# Patient Record
Sex: Female | Born: 1960 | Race: White | Hispanic: No | Marital: Married | Smoking: Never smoker
Health system: Southern US, Community
[De-identification: ages and names within clinical notes are randomized; demographics above are authoritative.]

## PROBLEM LIST (undated history)

## (undated) DIAGNOSIS — I10 Essential (primary) hypertension: Secondary | ICD-10-CM

## (undated) DIAGNOSIS — J45909 Unspecified asthma, uncomplicated: Secondary | ICD-10-CM

## (undated) DIAGNOSIS — E7800 Pure hypercholesterolemia, unspecified: Secondary | ICD-10-CM

## (undated) DIAGNOSIS — I639 Cerebral infarction, unspecified: Secondary | ICD-10-CM

## (undated) HISTORY — PX: ABDOMINAL HYSTERECTOMY: SHX81

## (undated) HISTORY — PX: FOOT SURGERY: SHX648

## (undated) HISTORY — PX: REPLACEMENT TOTAL KNEE: SUR1224

## (undated) HISTORY — PX: COMBINED KIDNEY-PANCREAS TRANSPLANT: SHX1382

---

## 2008-09-26 DIAGNOSIS — I63239 Cerebral infarction due to unspecified occlusion or stenosis of unspecified carotid arteries: Secondary | ICD-10-CM | POA: Insufficient documentation

## 2008-09-26 DIAGNOSIS — D649 Anemia, unspecified: Secondary | ICD-10-CM | POA: Insufficient documentation

## 2008-09-26 DIAGNOSIS — E113299 Type 2 diabetes mellitus with mild nonproliferative diabetic retinopathy without macular edema, unspecified eye: Secondary | ICD-10-CM | POA: Insufficient documentation

## 2008-12-05 DIAGNOSIS — F329 Major depressive disorder, single episode, unspecified: Secondary | ICD-10-CM | POA: Insufficient documentation

## 2009-09-25 DIAGNOSIS — E785 Hyperlipidemia, unspecified: Secondary | ICD-10-CM | POA: Insufficient documentation

## 2009-09-25 DIAGNOSIS — K219 Gastro-esophageal reflux disease without esophagitis: Secondary | ICD-10-CM | POA: Insufficient documentation

## 2010-07-21 DIAGNOSIS — I639 Cerebral infarction, unspecified: Secondary | ICD-10-CM | POA: Insufficient documentation

## 2010-09-22 DIAGNOSIS — M810 Age-related osteoporosis without current pathological fracture: Secondary | ICD-10-CM | POA: Insufficient documentation

## 2011-01-21 DIAGNOSIS — B009 Herpesviral infection, unspecified: Secondary | ICD-10-CM | POA: Insufficient documentation

## 2011-01-21 DIAGNOSIS — Z9483 Pancreas transplant status: Secondary | ICD-10-CM | POA: Insufficient documentation

## 2011-05-23 DIAGNOSIS — E11359 Type 2 diabetes mellitus with proliferative diabetic retinopathy without macular edema: Secondary | ICD-10-CM | POA: Insufficient documentation

## 2011-05-23 DIAGNOSIS — E113599 Type 2 diabetes mellitus with proliferative diabetic retinopathy without macular edema, unspecified eye: Secondary | ICD-10-CM | POA: Insufficient documentation

## 2011-05-23 DIAGNOSIS — H2510 Age-related nuclear cataract, unspecified eye: Secondary | ICD-10-CM | POA: Insufficient documentation

## 2011-05-23 DIAGNOSIS — H35379 Puckering of macula, unspecified eye: Secondary | ICD-10-CM | POA: Insufficient documentation

## 2011-05-30 DIAGNOSIS — E1039 Type 1 diabetes mellitus with other diabetic ophthalmic complication: Secondary | ICD-10-CM | POA: Insufficient documentation

## 2011-10-15 DIAGNOSIS — E0590 Thyrotoxicosis, unspecified without thyrotoxic crisis or storm: Secondary | ICD-10-CM | POA: Insufficient documentation

## 2011-12-02 DIAGNOSIS — H04123 Dry eye syndrome of bilateral lacrimal glands: Secondary | ICD-10-CM | POA: Insufficient documentation

## 2011-12-30 DIAGNOSIS — H40032 Anatomical narrow angle, left eye: Secondary | ICD-10-CM | POA: Insufficient documentation

## 2011-12-30 DIAGNOSIS — H35341 Macular cyst, hole, or pseudohole, right eye: Secondary | ICD-10-CM | POA: Insufficient documentation

## 2012-01-04 DIAGNOSIS — Z7901 Long term (current) use of anticoagulants: Secondary | ICD-10-CM | POA: Insufficient documentation

## 2012-02-08 DIAGNOSIS — Z872 Personal history of diseases of the skin and subcutaneous tissue: Secondary | ICD-10-CM | POA: Insufficient documentation

## 2012-02-08 DIAGNOSIS — Z85828 Personal history of other malignant neoplasm of skin: Secondary | ICD-10-CM | POA: Insufficient documentation

## 2012-04-19 DIAGNOSIS — G9340 Encephalopathy, unspecified: Secondary | ICD-10-CM | POA: Insufficient documentation

## 2013-09-02 ENCOUNTER — Ambulatory Visit
Admission: RE | Admit: 2013-09-02 | Discharge: 2013-09-02 | Disposition: A | Discharge status: Home / Self Care | Payer: 59 | Source: Ambulatory Visit | Attending: Family Medicine | Admitting: Family Medicine

## 2013-09-02 ENCOUNTER — Other Ambulatory Visit: Payer: Self-pay | Admitting: Family Medicine

## 2013-09-02 DIAGNOSIS — R0602 Shortness of breath: Secondary | ICD-10-CM

## 2013-09-02 DIAGNOSIS — R059 Cough, unspecified: Secondary | ICD-10-CM

## 2013-09-02 DIAGNOSIS — R05 Cough: Secondary | ICD-10-CM

## 2013-11-01 ENCOUNTER — Other Ambulatory Visit: Payer: Self-pay | Admitting: Family Medicine

## 2013-11-01 DIAGNOSIS — R05 Cough: Secondary | ICD-10-CM

## 2013-11-01 DIAGNOSIS — R059 Cough, unspecified: Secondary | ICD-10-CM

## 2013-11-01 DIAGNOSIS — R9389 Abnormal findings on diagnostic imaging of other specified body structures: Secondary | ICD-10-CM

## 2013-11-06 ENCOUNTER — Other Ambulatory Visit: Payer: Self-pay

## 2013-11-25 ENCOUNTER — Ambulatory Visit
Admission: RE | Admit: 2013-11-25 | Discharge: 2013-11-25 | Disposition: A | Discharge status: Home / Self Care | Payer: 59 | Source: Ambulatory Visit | Attending: Family Medicine | Admitting: Family Medicine

## 2013-11-25 DIAGNOSIS — R059 Cough, unspecified: Secondary | ICD-10-CM

## 2013-11-25 DIAGNOSIS — R05 Cough: Secondary | ICD-10-CM

## 2013-11-25 DIAGNOSIS — R9389 Abnormal findings on diagnostic imaging of other specified body structures: Secondary | ICD-10-CM

## 2013-11-25 MED ORDER — IOHEXOL 300 MG/ML  SOLN
50.0000 mL | Freq: Once | INTRAMUSCULAR | Status: AC | PRN
  Administered 2013-11-25: 50 mL via INTRAVENOUS

## 2014-03-13 ENCOUNTER — Other Ambulatory Visit: Payer: Self-pay | Admitting: Family Medicine

## 2014-03-13 DIAGNOSIS — Z1231 Encounter for screening mammogram for malignant neoplasm of breast: Secondary | ICD-10-CM

## 2014-03-25 ENCOUNTER — Inpatient Hospital Stay: Admission: RE | Admit: 2014-03-25 | Payer: Self-pay | Source: Ambulatory Visit

## 2014-11-07 ENCOUNTER — Other Ambulatory Visit: Payer: Self-pay

## 2014-11-07 DIAGNOSIS — Z1231 Encounter for screening mammogram for malignant neoplasm of breast: Secondary | ICD-10-CM

## 2014-11-14 ENCOUNTER — Ambulatory Visit
Admission: RE | Admit: 2014-11-14 | Discharge: 2014-11-14 | Disposition: A | Discharge status: Home / Self Care | Payer: BLUE CROSS/BLUE SHIELD | Source: Ambulatory Visit

## 2014-11-14 ENCOUNTER — Ambulatory Visit: Payer: Self-pay

## 2014-11-14 DIAGNOSIS — Z1231 Encounter for screening mammogram for malignant neoplasm of breast: Secondary | ICD-10-CM

## 2015-01-17 ENCOUNTER — Emergency Department (HOSPITAL_COMMUNITY)
Admission: EM | Admit: 2015-01-17 | Discharge: 2015-01-17 | Disposition: A | Discharge status: Home / Self Care | Payer: BLUE CROSS/BLUE SHIELD | Attending: Emergency Medicine | Admitting: Emergency Medicine

## 2015-01-17 ENCOUNTER — Encounter (HOSPITAL_COMMUNITY): Payer: Self-pay | Admitting: *Deleted

## 2015-01-17 ENCOUNTER — Emergency Department (HOSPITAL_COMMUNITY): Payer: BLUE CROSS/BLUE SHIELD

## 2015-01-17 DIAGNOSIS — Z8639 Personal history of other endocrine, nutritional and metabolic disease: Secondary | ICD-10-CM | POA: Insufficient documentation

## 2015-01-17 DIAGNOSIS — I10 Essential (primary) hypertension: Secondary | ICD-10-CM | POA: Diagnosis not present

## 2015-01-17 DIAGNOSIS — S4991XA Unspecified injury of right shoulder and upper arm, initial encounter: Secondary | ICD-10-CM | POA: Diagnosis present

## 2015-01-17 DIAGNOSIS — W010XXA Fall on same level from slipping, tripping and stumbling without subsequent striking against object, initial encounter: Secondary | ICD-10-CM | POA: Insufficient documentation

## 2015-01-17 DIAGNOSIS — Y9389 Activity, other specified: Secondary | ICD-10-CM | POA: Insufficient documentation

## 2015-01-17 DIAGNOSIS — Y998 Other external cause status: Secondary | ICD-10-CM | POA: Insufficient documentation

## 2015-01-17 DIAGNOSIS — S42211A Unspecified displaced fracture of surgical neck of right humerus, initial encounter for closed fracture: Secondary | ICD-10-CM | POA: Diagnosis not present

## 2015-01-17 DIAGNOSIS — J45909 Unspecified asthma, uncomplicated: Secondary | ICD-10-CM | POA: Insufficient documentation

## 2015-01-17 DIAGNOSIS — Z8673 Personal history of transient ischemic attack (TIA), and cerebral infarction without residual deficits: Secondary | ICD-10-CM | POA: Diagnosis not present

## 2015-01-17 DIAGNOSIS — M25511 Pain in right shoulder: Secondary | ICD-10-CM

## 2015-01-17 DIAGNOSIS — Y9289 Other specified places as the place of occurrence of the external cause: Secondary | ICD-10-CM | POA: Insufficient documentation

## 2015-01-17 HISTORY — DX: Cerebral infarction, unspecified: I63.9

## 2015-01-17 HISTORY — DX: Pure hypercholesterolemia, unspecified: E78.00

## 2015-01-17 HISTORY — DX: Essential (primary) hypertension: I10

## 2015-01-17 HISTORY — DX: Unspecified asthma, uncomplicated: J45.909

## 2015-01-17 MED ORDER — HYDROCODONE-ACETAMINOPHEN 5-325 MG PO TABS
1.0000 | ORAL_TABLET | Freq: Once | ORAL | Status: AC
  Administered 2015-01-17: 1 via ORAL
  Filled 2015-01-17: qty 1

## 2015-01-17 MED ORDER — HYDROCODONE-ACETAMINOPHEN 5-325 MG PO TABS: 1.0000 | ORAL_TABLET | Freq: Four times a day (QID) | ORAL | Status: DC | PRN

## 2015-01-17 MED ORDER — NAPROXEN 500 MG PO TABS: 500.0000 mg | ORAL_TABLET | Freq: Two times a day (BID) | ORAL | Status: DC | PRN

## 2015-01-17 NOTE — ED Notes (Signed)
Pt reports tripping and falling yesterday, landed on right arm. Reports pain to upper arm and shoulder, decreased ROM but +radial and able to move digits.

## 2015-01-17 NOTE — Discharge Instructions (Signed)
Wear shoulder sling at all times until you see the orthopedist. Ice your shoulder throughout the day, using an ice pack for 20 minutes at a time every hour. Alternate between naprosyn and norco for pain relief. Do not drive or operate machinery with pain medication use. Call Dr. Berenice Primas on Monday to arrange ongoing management of your fracture. Return to the ER for changes or worsening symptoms.    Humerus Fracture Treated With Immobilization The humerus is the large bone in the upper arm. A broken (fractured) humerus is often treated by wearing a cast, splint, or sling (immobilization). This holds the broken pieces in place so they can heal.  HOME CARE  Put ice on the injured area.  Put ice in a plastic bag.  Place a towel between your skin and the bag.  Leave the ice on for 15-20 minutes, 03-04 times a day.  If you are given a cast:  Do not scratch the skin under the cast.  Check the skin around the cast every day. You may put lotion on any red or sore areas.  Keep the cast dry and clean.  If you are given a splint:  Wear the splint as told.  Keep the splint clean and dry.  Loosen the elastic around the splint if your fingers become numb, cold, tingle, or turn blue.  If you are given a sling:  Wear the sling as told.  Do not put pressure on any part of the cast or splint until it is fully hardened.  The cast or splint must be protected with a plastic bag during bathing. Do not lower the cast or splint into water.  Only take medicine as told by your doctor.  Do exercises as told by your doctor.  Follow up as told by your doctor. GET HELP RIGHT AWAY IF:   Your skin or fingernails turn blue or gray.  Your arm feels cold or numb.  You have very bad pain in the injured arm.  You are having problems with the medicines you were given. MAKE SURE YOU:   Understand these instructions.  Will watch your condition.  Will get help right away if you are not doing well or  get worse.   This information is not intended to replace advice given to you by your health care provider. Make sure you discuss any questions you have with your health care provider.   Document Released: 08/10/2007 Document Revised: 03/14/2014 Document Reviewed: 07/16/2014 Elsevier Interactive Patient Education 2016 [employer]  Shoulder Fracture (Proximal Humerus or Glenoid) A shoulder fracture is a broken upper arm bone or a broken socket bone. The humerus is the upper arm bone and the glenoid is the shoulder socket. Proximal means the humerus is broken near the shoulder. Most of the time the bones of a broken shoulder are in an acceptable position. Usually, the injury can be treated with a shoulder immobilizer or sling and swath bandage. These devices support the arm and prevent any shoulder movement. If the bones are not in a good position, then surgery is sometimes needed. Shoulder fractures usually initially cause swelling, pain, and discoloration around the upper arm. They heal in 8 to 12 weeks with proper treatment. SYMPTOMS  At the time of injury:  Pain.  Tenderness.  Regular body contours are not normal. Later symptoms may include:  Swelling and bruising of the elbow and hand.  Swelling and bruising of the arm or chest. Other symptoms include:  Pain when lifting or turning  the arm.  Paralysis below the fracture.  Numbness or coldness below the fracture. CAUSES   Indirect force from falling on an outstretched arm.  A blow to the shoulder. RISK INCREASES WITH:  Not being in shape.  Playing contact sports, such as football, soccer, hockey, or rugby.  Sports where falling on an outstretched arm occurs, such as basketball, skateboarding, or volleyball.  History of bone or joint disease.  History of shoulder injury. PREVENTION  Warm up before activity.  Stretch before activity.  Stay in shape with your:  Heart fitness.  Flexibility.  Shoulder  Strength.  Falling with the proper technique. PROGNOSIS  In adults, healing time is about 7 weeks. For children, healing time is about 5 weeks. Surgery may be needed. RELATED COMPLICATIONS  The bones do not heal together (nonunion).  The bones do not align properly when they heal (malunion).  Long-term problems with pain, stiffness, swelling, or loss of motion.  The injured arm heals shorter than the other.  Nerves are injured in the arm.  Arthritis in the shoulder.  Normal bone growth is interrupted in children.  Blood supply to the shoulder joint is diminished. TREATMENT If the bones are aligned, then initial treatment will be with ice and medicine to help with pain. The shoulder will be held in place with a sling (immobilization). The shoulder will be allowed to heal for up to 6 weeks. Injuries that may need surgery include:  Severe fractures.  Fractures that are not in appropriate alignment (displaced).  Non-displaced fractures (not common). Surgery helps the bones align correctly. The bones may be held in place with:  Sutures.  Wires.  Rods.  Plates.  Screws.  Pins. If you have had surgery or not, you will likely be assisted by a physical therapist or athletic trainer to get the best results with your injured shoulder. This will likely include exercises to strengthen and stretch the injured and surrounding areas. MEDICATION  If pain medicine is needed, nonsteroidal anti-inflammatory medicines (such as aspirin or ibuprofen) or other minor pain relievers (such as acetaminophen) are often advised.  Do not take pain medicine for 7 days before surgery.  Stronger pain relievers may be prescribed. Use only as directed and take only as much as you need. COLD THERAPY Cold treatment (icing) relieves pain and reduces inflammation. Cold treatment should be applied for 10 to 15 minutes every 2 to 3 hours, and immediately after activity that aggravates your symptoms. Use  ice packs or an ice massage. SEEK IMMEDIATE MEDICAL CARE IF:  You have severe shoulder pain unrelieved by rest and taking pain medicine.  You have pain, numbness, tingling, or weakness in the hand or wrist.  You have shortness of breath, chest pain, severe weakness, or fainting.  You have severe pain with motion of the fingers or wrist.  Blue, gray, or dark color appears in the fingernails on injured extremity.   This information is not intended to replace advice given to you by your health care provider. Make sure you discuss any questions you have with your health care provider.   Document Released: 02/21/2005 Document Revised: 05/16/2011 Document Reviewed: 06/05/2008 Elsevier Interactive Patient Education 2016 Elsevier Inc.  Cryotherapy Cryotherapy is when you put ice on your injury. Ice helps lessen pain and puffiness (swelling) after an injury. Ice works the best when you start using it in the first 24 to 48 hours after an injury. HOME CARE  Put a dry or damp towel between the ice pack  and your skin.  You may press gently on the ice pack.  Leave the ice on for no more than 10 to 20 minutes at a time.  Check your skin after 5 minutes to make sure your skin is okay.  Rest at least 20 minutes between ice pack uses.  Stop using ice when your skin loses feeling (numbness).  Do not use ice on someone who cannot tell you when it hurts. This includes small children and people with memory problems (dementia). GET HELP RIGHT AWAY IF:  You have white spots on your skin.  Your skin turns blue or pale.  Your skin feels waxy or hard.  Your puffiness gets worse. MAKE SURE YOU:   Understand these instructions.  Will watch your condition.  Will get help right away if you are not doing well or get worse.   This information is not intended to replace advice given to you by your health care provider. Make sure you discuss any questions you have with your health care provider.    Document Released: 08/10/2007 Document Revised: 05/16/2011 Document Reviewed: 10/14/2010 Elsevier Interactive Patient Education [employer].

## 2015-01-17 NOTE — ED Provider Notes (Signed)
CSN: [id number]     Arrival date & time 01/17/15  1132 History  By signing my name below, I, Sherri Todd, attest that this documentation has been prepared under the direction and in the presence of [employer], [employer] Electronically Signed: Soijett Todd, ED Scribe. 01/17/2015. 12:43 PM.   Chief Complaint  Patient presents with  . Fall  . Arm Injury      Patient is a 54 y.o. female presenting with arm injury. The history is provided by the patient. No language interpreter was used.  Arm Injury Location:  Arm Time since incident:  1 day Injury: yes   Mechanism of injury: fall   Fall:    Fall occurred:  Tripped   Height of fall:  Standing   Impact surface:  Unable to specify   Point of impact:  Outstretched arms   Entrapped after fall: no   Arm location:  R arm and R upper arm Pain details:    Quality:  Dull   Radiates to:  Does not radiate   Severity:  Moderate   Onset quality:  Sudden   Duration:  1 day   Timing:  Constant   Progression:  Unchanged Chronicity:  New Dislocation: no   Foreign body present:  Unable to specify Prior injury to area:  No Relieved by:  Nothing Worsened by:  Movement Ineffective treatments:  Acetaminophen and NSAIDs Associated symptoms: decreased range of motion (due to pain)   Associated symptoms: no back pain, no fever, no muscle weakness, no neck pain, no numbness, no swelling and no tingling     Sherri Todd is a 54 y.o. female with a PMHx of HTN and CVA, who presents to the Emergency department complaining of fall onset yesterday. Pt states that she tripped on something and fell on her outstretched right arm to brace her fall. Pt reports R shoulder pain which she describes as 2/10 while immobile and 10/10 with movement, constant, dull, and it does not radiate. She states that movement worsens her pain. She reports that she has tried ibuprofen and extra strength tylenol with no relief for her symptoms. She states that she is having  associated symptoms of loss of ROM due to pain.  She denies numbness, tingling, weakness, neck pain, back pain, hitting her head, LOC, fever, chills, CP, SOB, abdominal pain, n/v/d/c, dysuria, hematuria, bruising, swelling, and any other symptoms or injuries. Pt denies being allergic to any medications.     Past Medical History  Diagnosis Date  . Hypertension   . High cholesterol   . Asthma   . Stroke [facility])    Past Surgical History  Procedure Laterality Date  . Combined kidney-pancreas transplant    . Abdominal hysterectomy     History reviewed. No pertinent family history. Social History  Substance Use Topics  . Smoking status: Never Smoker   . Smokeless tobacco: None  . Alcohol Use: Yes     Comment: occ   OB History    No data available     Review of Systems  Constitutional: Negative for fever and chills.  HENT: Negative for facial swelling (no head inj).   Respiratory: Negative for shortness of breath.   Cardiovascular: Negative for chest pain.  Gastrointestinal: Negative for nausea, vomiting, abdominal pain, diarrhea and constipation.  Genitourinary: Negative for dysuria and hematuria.  Musculoskeletal: Positive for arthralgias (right shoulder). Negative for back pain, joint swelling and neck pain.  Skin: Negative for color change.  Allergic/Immunologic: Negative for immunocompromised state.  Neurological: Negative for syncope, weakness and numbness.  Psychiatric/Behavioral: Negative for confusion.   10 Systems reviewed and all are negative for acute change except as noted in the HPI.   Allergies  Venofer  Home Medications   Prior to Admission medications   Not on File   BP 123/63 mmHg  Pulse 103  Temp(Src) 97.8 F (36.6 C) (Oral)  Resp 16  Ht 5\' 1"  (1.549 m)  Wt 133 lb 3 oz (60.413 kg)  BMI 25.18 kg/m2  SpO2 100% Physical Exam  Constitutional: She is oriented to person, place, and time. Vital signs are normal. She appears well-developed and  well-nourished.  Non-toxic appearance. No distress.  Afebrile, nontoxic, NAD  HENT:  Head: Normocephalic and atraumatic.  Mouth/Throat: Mucous membranes are normal.  Eyes: Conjunctivae and EOM are normal. Right eye exhibits no discharge. Left eye exhibits no discharge.  Neck: Normal range of motion. Neck supple.  Cardiovascular: Normal rate and intact distal pulses.   Pulmonary/Chest: Effort normal. No respiratory distress.  Abdominal: Normal appearance. She exhibits no distension.  Musculoskeletal:       Right shoulder: She exhibits decreased range of motion (due to pain), tenderness, bony tenderness and swelling. She exhibits no crepitus, no deformity, normal pulse and normal strength.       Right elbow: Normal.      Arms: Right shoulder with limited ROM due to pain, +anterior/acromial bony TTP, and diffuse deltoid muscular TTP without spasms, mild swelling/effusion, bruise to anterior shoulder/upper arm, no deformity, +apley scratch, +pain with resisted int/ext rotation, unable to perform empty can test due to pain. Strength and sensation grossly intact in all extremities, distal pulses intact. No right elbow tenderness.    Neurological: She is alert and oriented to person, place, and time. She has normal strength. No sensory deficit.  Skin: Skin is warm, dry and intact. No rash noted.  Psychiatric: She has a normal mood and affect. Her behavior is normal.  Nursing note and vitals reviewed.   ED Course  Procedures (including critical care time) DIAGNOSTIC STUDIES: Oxygen Saturation is 100% on RA, nl by my interpretation.    COORDINATION OF CARE: 12:23 PM Discussed treatment plan with pt at bedside which includes right shoulder xray and norco and pt agreed to plan.    Labs Review Labs Reviewed - No data to display  Imaging Review Dg Shoulder Right  01/17/2015  CLINICAL DATA:  Right shoulder pain status post fall EXAM: RIGHT SHOULDER - 2+ VIEW COMPARISON:  None. FINDINGS:  Comminuted right proximal humeral fracture, with at least 3 dominant fracture fragments (greater tuberosity fragment and a surgical neck fracture). Mild displacement of the humeral shaft at the surgical neck. Visualized right lung is clear. IMPRESSION: Three-part right humeral neck fracture, as above. Electronically Signed   By: Julian Hy M.D.   On: 01/17/2015 13:28   Ct Shoulder Right Wo Contrast  01/17/2015  CLINICAL DATA:  [place] in kitchen this morning. Patient with a proximal humeral fracture radiographically. CT performed for further fracture delineation. EXAM: CT OF THE RIGHT SHOULDER WITHOUT CONTRAST TECHNIQUE: Multidetector CT imaging was performed according to the standard protocol. Multiplanar CT image reconstructions were also generated. COMPARISON:  Current radiographs of the right shoulder. FINDINGS: There is a comminuted fracture of the proximal humerus. There is an oblique fracture across the metaphysis extending to the base of the femoral head. Additional fractures extend across the base of the greater tuberosity. Fracture line extends across the anterior margin of the humeral head articular  surface where there is a step-off of 2 mm. There is no significant fracture displacement or angulation. There is a moderate joint effusion. Glenohumeral joint is normally spaced and aligned as is the [facility] joint. IMPRESSION: 1. Comminuted fractures of the proximal right humerus involving the metaphysis and humeral head, without significant displacement or angulation. No dislocation. Electronically Signed   By: Lajean Manes M.D.   On: 01/17/2015 15:26   I have personally reviewed and evaluated these images as part of my medical decision-making.   EKG Interpretation None      MDM   Final diagnoses:  Right shoulder pain  Humeral surgical neck fracture, right, closed, initial encounter    54 y.o. female here with mechanical fall onto outstretched arm. R shoulder pain, difficulty with ROM. NVI  with soft compartments. Focal tenderness near acromion and proximal humerus/deltoid area. Will obtain xray imaging to eval for fx/dislocation. Will give pain meds and reassess after xray.   1:39 PM Xray revealing 3 park humeral neck fx with mild displacement of humeral shaft at the surgical neck. Will discuss case with ortho to decide on management, given that it's neurovascularly intact could await surgical repair but some mild displacement noted on xray therefore will discuss with ortho. If nothing else, want pt to have close ortho f/up. Pt's last meal was a granola bar at 10am. Pain currently improved after norco. Will reassess shortly.   2:03 PM Dr. Berenice Primas returning page, would like CT to fully eval fracture fragments, place in immobilizer and have her f/up in office on Mon/Tues. Will get CT here and then likely d/c. Will monitor and reassess shortly  3:54 PM CT resulting. Pain still well controlled. Will d/c home with pain meds, discussed sling immobilizer use at all times. Ice use discussed. F/up with Dr. Berenice Primas on Monday. I explained the diagnosis and have given explicit precautions to return to the ER including for any other new or worsening symptoms. The patient understands and accepts the medical plan as it's been dictated and I have answered their questions. Discharge instructions concerning home care and prescriptions have been given. The patient is STABLE and is discharged to home in good condition.   I personally performed the services described in this documentation, which was scribed in my presence. The recorded information has been reviewed and is accurate.   BP 123/63 mmHg  Pulse 103  Temp(Src) 97.8 F (36.6 C) (Oral)  Resp 16  Ht 5\' 1"  (1.549 m)  Wt 133 lb 3 oz (60.413 kg)  BMI 25.18 kg/m2  SpO2 100%  Meds ordered this encounter  Medications  . HYDROcodone-acetaminophen (NORCO/VICODIN) 5-325 MG per tablet 1 tablet    Sig:   . HYDROcodone-acetaminophen (NORCO) 5-325 MG  tablet    Sig: Take 1-2 tablets by mouth every 6 (six) hours as needed for severe pain.    Dispense:  24 tablet    Refill:  0    Order Specific Question:  Supervising Provider    Answer:  MILLER, BRIAN [[id number]]  . naproxen (NAPROSYN) 500 MG tablet    Sig: Take 1 tablet (500 mg total) by mouth 2 (two) times daily as needed for mild pain or moderate pain (TAKE WITH MEALS.).    Dispense:  20 tablet    Refill:  0    Order Specific Question:  Supervising Provider    Answer:  Barnett Hatter, PA-C 01/17/15 Jane Lew, MD 01/18/15 [phone]

## 2015-12-25 DIAGNOSIS — E1022 Type 1 diabetes mellitus with diabetic chronic kidney disease: Secondary | ICD-10-CM | POA: Diagnosis not present

## 2015-12-25 DIAGNOSIS — I10 Essential (primary) hypertension: Secondary | ICD-10-CM | POA: Diagnosis not present

## 2015-12-25 DIAGNOSIS — Z23 Encounter for immunization: Secondary | ICD-10-CM | POA: Diagnosis not present

## 2015-12-25 DIAGNOSIS — Z940 Kidney transplant status: Secondary | ICD-10-CM | POA: Diagnosis not present

## 2015-12-25 DIAGNOSIS — Z4822 Encounter for aftercare following kidney transplant: Secondary | ICD-10-CM | POA: Diagnosis not present

## 2015-12-25 DIAGNOSIS — D649 Anemia, unspecified: Secondary | ICD-10-CM | POA: Diagnosis not present

## 2015-12-25 DIAGNOSIS — Z9483 Pancreas transplant status: Secondary | ICD-10-CM | POA: Diagnosis not present

## 2016-01-15 DIAGNOSIS — Z9483 Pancreas transplant status: Secondary | ICD-10-CM | POA: Diagnosis not present

## 2016-01-15 DIAGNOSIS — Z5181 Encounter for therapeutic drug level monitoring: Secondary | ICD-10-CM | POA: Diagnosis not present

## 2016-01-15 DIAGNOSIS — Z940 Kidney transplant status: Secondary | ICD-10-CM | POA: Diagnosis not present

## 2016-02-05 DIAGNOSIS — Z940 Kidney transplant status: Secondary | ICD-10-CM | POA: Diagnosis not present

## 2016-02-05 DIAGNOSIS — J45909 Unspecified asthma, uncomplicated: Secondary | ICD-10-CM | POA: Diagnosis not present

## 2016-02-05 DIAGNOSIS — D649 Anemia, unspecified: Secondary | ICD-10-CM | POA: Diagnosis not present

## 2016-02-05 DIAGNOSIS — N186 End stage renal disease: Secondary | ICD-10-CM | POA: Diagnosis not present

## 2016-02-15 ENCOUNTER — Other Ambulatory Visit: Payer: Self-pay | Admitting: Family Medicine

## 2016-02-15 DIAGNOSIS — Z1231 Encounter for screening mammogram for malignant neoplasm of breast: Secondary | ICD-10-CM

## 2016-02-23 ENCOUNTER — Other Ambulatory Visit: Payer: Self-pay | Admitting: Family Medicine

## 2016-02-23 DIAGNOSIS — N63 Unspecified lump in unspecified breast: Secondary | ICD-10-CM

## 2016-03-03 ENCOUNTER — Ambulatory Visit
Admission: RE | Admit: 2016-03-03 | Discharge: 2016-03-03 | Disposition: A | Discharge status: Home / Self Care | Payer: BLUE CROSS/BLUE SHIELD | Source: Ambulatory Visit | Attending: Family Medicine | Admitting: Family Medicine

## 2016-03-03 DIAGNOSIS — R922 Inconclusive mammogram: Secondary | ICD-10-CM | POA: Diagnosis not present

## 2016-03-03 DIAGNOSIS — N63 Unspecified lump in unspecified breast: Secondary | ICD-10-CM

## 2016-03-03 DIAGNOSIS — N6489 Other specified disorders of breast: Secondary | ICD-10-CM | POA: Diagnosis not present

## 2016-07-14 DIAGNOSIS — Z5181 Encounter for therapeutic drug level monitoring: Secondary | ICD-10-CM | POA: Diagnosis not present

## 2016-07-14 DIAGNOSIS — Z940 Kidney transplant status: Secondary | ICD-10-CM | POA: Diagnosis not present

## 2016-07-14 DIAGNOSIS — Z9483 Pancreas transplant status: Secondary | ICD-10-CM | POA: Diagnosis not present

## 2017-01-17 DIAGNOSIS — Z940 Kidney transplant status: Secondary | ICD-10-CM | POA: Diagnosis not present

## 2017-01-17 DIAGNOSIS — Z9225 Personal history of immunosupression therapy: Secondary | ICD-10-CM | POA: Diagnosis not present

## 2017-01-17 DIAGNOSIS — I10 Essential (primary) hypertension: Secondary | ICD-10-CM | POA: Diagnosis not present

## 2017-01-17 DIAGNOSIS — Z9483 Pancreas transplant status: Secondary | ICD-10-CM | POA: Diagnosis not present

## 2017-01-17 DIAGNOSIS — D649 Anemia, unspecified: Secondary | ICD-10-CM | POA: Diagnosis not present

## 2017-01-17 DIAGNOSIS — Z48288 Encounter for aftercare following multiple organ transplant: Secondary | ICD-10-CM | POA: Diagnosis not present

## 2017-01-17 DIAGNOSIS — Z79899 Other long term (current) drug therapy: Secondary | ICD-10-CM | POA: Diagnosis not present

## 2017-03-06 DIAGNOSIS — J209 Acute bronchitis, unspecified: Secondary | ICD-10-CM | POA: Diagnosis not present

## 2017-03-06 DIAGNOSIS — J45901 Unspecified asthma with (acute) exacerbation: Secondary | ICD-10-CM | POA: Diagnosis not present

## 2017-06-05 ENCOUNTER — Other Ambulatory Visit: Payer: Self-pay | Admitting: Family Medicine

## 2017-06-05 DIAGNOSIS — Z1231 Encounter for screening mammogram for malignant neoplasm of breast: Secondary | ICD-10-CM

## 2017-06-05 DIAGNOSIS — J309 Allergic rhinitis, unspecified: Secondary | ICD-10-CM | POA: Diagnosis not present

## 2017-06-05 DIAGNOSIS — E7800 Pure hypercholesterolemia, unspecified: Secondary | ICD-10-CM | POA: Diagnosis not present

## 2017-06-05 DIAGNOSIS — D509 Iron deficiency anemia, unspecified: Secondary | ICD-10-CM | POA: Diagnosis not present

## 2017-06-05 DIAGNOSIS — F39 Unspecified mood [affective] disorder: Secondary | ICD-10-CM | POA: Diagnosis not present

## 2017-07-05 ENCOUNTER — Other Ambulatory Visit: Payer: Self-pay | Admitting: Family Medicine

## 2017-07-05 ENCOUNTER — Ambulatory Visit
Admission: RE | Admit: 2017-07-05 | Discharge: 2017-07-05 | Disposition: A | Discharge status: Home / Self Care | Payer: BLUE CROSS/BLUE SHIELD | Source: Ambulatory Visit | Attending: Family Medicine | Admitting: Family Medicine

## 2017-07-05 DIAGNOSIS — Z1231 Encounter for screening mammogram for malignant neoplasm of breast: Secondary | ICD-10-CM | POA: Diagnosis not present

## 2018-08-23 ENCOUNTER — Other Ambulatory Visit: Payer: Self-pay | Admitting: Internal Medicine

## 2018-08-23 DIAGNOSIS — Z1231 Encounter for screening mammogram for malignant neoplasm of breast: Secondary | ICD-10-CM

## 2018-10-03 ENCOUNTER — Ambulatory Visit: Payer: BLUE CROSS/BLUE SHIELD

## 2018-10-05 ENCOUNTER — Other Ambulatory Visit: Payer: Self-pay

## 2018-10-05 ENCOUNTER — Ambulatory Visit
Admission: RE | Admit: 2018-10-05 | Discharge: 2018-10-05 | Disposition: A | Discharge status: Home / Self Care | Payer: BC Managed Care – PPO | Source: Ambulatory Visit | Attending: Internal Medicine | Admitting: Internal Medicine

## 2018-10-05 DIAGNOSIS — Z1231 Encounter for screening mammogram for malignant neoplasm of breast: Secondary | ICD-10-CM

## 2019-10-11 ENCOUNTER — Other Ambulatory Visit: Payer: Self-pay | Admitting: Family Medicine

## 2019-10-11 DIAGNOSIS — Z1231 Encounter for screening mammogram for malignant neoplasm of breast: Secondary | ICD-10-CM

## 2019-10-22 ENCOUNTER — Ambulatory Visit
Admission: RE | Admit: 2019-10-22 | Discharge: 2019-10-22 | Disposition: A | Discharge status: Home / Self Care | Payer: BC Managed Care – PPO | Source: Ambulatory Visit | Attending: Family Medicine | Admitting: Family Medicine

## 2019-10-22 ENCOUNTER — Other Ambulatory Visit: Payer: Self-pay

## 2019-10-22 DIAGNOSIS — Z1231 Encounter for screening mammogram for malignant neoplasm of breast: Secondary | ICD-10-CM

## 2019-11-04 ENCOUNTER — Other Ambulatory Visit (HOSPITAL_COMMUNITY)
Admission: RE | Admit: 2019-11-04 | Discharge: 2019-11-04 | Disposition: A | Discharge status: Home / Self Care | Payer: BC Managed Care – PPO | Source: Ambulatory Visit | Attending: Family Medicine | Admitting: Family Medicine

## 2019-11-04 DIAGNOSIS — Z0000 Encounter for general adult medical examination without abnormal findings: Secondary | ICD-10-CM | POA: Insufficient documentation

## 2019-11-06 ENCOUNTER — Other Ambulatory Visit: Payer: Self-pay | Admitting: Family Medicine

## 2019-11-06 DIAGNOSIS — M8580 Other specified disorders of bone density and structure, unspecified site: Secondary | ICD-10-CM

## 2019-11-06 LAB — CYTOLOGY - PAP
Comment: NEGATIVE
Diagnosis: NEGATIVE
High risk HPV: NEGATIVE

## 2019-11-25 ENCOUNTER — Ambulatory Visit: Payer: BC Managed Care – PPO | Admitting: Cardiology

## 2019-11-27 ENCOUNTER — Ambulatory Visit: Payer: BC Managed Care – PPO | Admitting: Cardiology

## 2019-12-25 ENCOUNTER — Other Ambulatory Visit: Payer: Self-pay

## 2019-12-27 ENCOUNTER — Encounter: Payer: Self-pay | Admitting: Cardiology

## 2019-12-27 ENCOUNTER — Ambulatory Visit (INDEPENDENT_AMBULATORY_CARE_PROVIDER_SITE_OTHER): Payer: BC Managed Care – PPO | Admitting: Cardiology

## 2019-12-27 ENCOUNTER — Other Ambulatory Visit: Payer: Self-pay

## 2019-12-27 VITALS — BP 126/70 | HR 80 | Ht 61.0 in | Wt 121.0 lb

## 2019-12-27 DIAGNOSIS — Z8673 Personal history of transient ischemic attack (TIA), and cerebral infarction without residual deficits: Secondary | ICD-10-CM | POA: Insufficient documentation

## 2019-12-27 DIAGNOSIS — E108 Type 1 diabetes mellitus with unspecified complications: Secondary | ICD-10-CM | POA: Diagnosis not present

## 2019-12-27 DIAGNOSIS — N185 Chronic kidney disease, stage 5: Secondary | ICD-10-CM

## 2019-12-27 DIAGNOSIS — Z940 Kidney transplant status: Secondary | ICD-10-CM

## 2019-12-27 DIAGNOSIS — R011 Cardiac murmur, unspecified: Secondary | ICD-10-CM | POA: Diagnosis not present

## 2019-12-27 NOTE — Progress Notes (Signed)
Cardiology Office Note:    Date:  12/27/2019   ID:  Sherri Todd, DOB 1960/03/09, MRN [id number]  PCP:  Donald Prose, MD  Cardiologist:  Berniece Salines, DO  Electrophysiologist:  None   Referring MD: Kelton Pillar, MD   Chief Complaint  Patient presents with  . Heart Murmur    History of Present Illness:    Sherri Todd is a 59 y.o. female with a hx of type 1 diabetes diagnosed at age 46, history of ESRD previously on hemodialysis, had a kidney transplant 22 years ago, status post right parietal watershed infarct the patient tells me that she lost some function has completely gained it back, history of coronary artery disease unclear which side presents today after being advised by her primary care doctor to see a cardiologist due to heart murmur.  She tells me she has not had any chest pain, no significant shortness of breath, no lightheadedness dizziness.  Past Medical History:  Diagnosis Date  . Asthma   . High cholesterol   . Hypertension   . Stroke [facility])     Past Surgical History:  Procedure Laterality Date  . ABDOMINAL HYSTERECTOMY    . CESAREAN SECTION    . COMBINED KIDNEY-PANCREAS TRANSPLANT    . FOOT SURGERY     Toe repair  . REPLACEMENT TOTAL KNEE Bilateral    Partial on Left and complete on right    Current Medications: Current Meds  Medication Sig  . acyclovir (ZOVIRAX) 200 MG/5ML suspension Take by mouth in the morning and at bedtime.  Marland Kitchen ascorbic acid (VITAMIN C) 500 MG tablet Take by mouth daily.  Marland Kitchen aspirin 81 MG EC tablet Take 81 mg by mouth daily.  . [place]-Glucosamine-Vit D (OSTEO BI-FLEX-GLUCOS/5-LOXIN) TABS Take by mouth.  . calcium carbonate (TUMS EX) 750 MG chewable tablet Chew by mouth.  . Cetirizine HCl 10 MG CAPS Take 10 mg by mouth daily.  . Cholecalciferol 125 MCG (5000 UT) TABS Take 25 Units by mouth daily.   Marland Kitchen diltiazem (CARDIZEM CD) 240 MG 24 hr capsule Take 240 mg by mouth daily.  Marland Kitchen FLUoxetine (PROZAC) 20 MG capsule Take  1 capsule by mouth daily.  . Multiple Vitamin (MULTIVITAMIN) capsule Take 1 capsule by mouth daily.  . mycophenolate (CELLCEPT) 250 MG capsule Take 750 mg by mouth 2 (two) times daily.  . Omega-3 Fatty Acids (FP FISH OIL PO) Take 1 tablet by mouth daily.  Marland Kitchen POLY-IRON 150 150 MG capsule Take 150 mg by mouth at bedtime.  . polyethylene glycol powder (GLYCOLAX/MIRALAX) 17 GM/SCOOP powder Take by mouth as needed.  . predniSONE (DELTASONE) 5 MG tablet Take 5 mg by mouth daily.  . simvastatin (ZOCOR) 40 MG tablet Take 40 mg by mouth at bedtime.  . tacrolimus (PROGRAF) 0.5 MG capsule Take by mouth.  . valACYclovir (VALTREX) 500 MG tablet TAKE 1 CAPLET BY MOUTH TWICE DAILY FOR 3 DAYS AS NEEDED FOR OUTBREAK     Allergies:   Ferric oxide, Gluten meal, Iron dextran, Iron sucrose, Soy allergy, and Wheat bran   Social History   Socioeconomic History  . Marital status: Single    Spouse name: Not on file  . Number of children: Not on file  . Years of education: Not on file  . Highest education level: Not on file  Occupational History  . Not on file  Tobacco Use  . Smoking status: Never Smoker  . Smokeless tobacco: Never Used  Substance and Sexual Activity  . Alcohol  use: Yes    Alcohol/week: 2.0 standard drinks    Types: 2 Glasses of wine per week  . Drug use: No  . Sexual activity: Not on file  Other Topics Concern  . Not on file  Social History Narrative  . Not on file   Social Determinants of Health   Financial Resource Strain:   . Difficulty of Paying Living Expenses: Not on file  Food Insecurity:   . Worried About Charity fundraiser in the Last Year: Not on file  . Ran Out of Food in the Last Year: Not on file  Transportation Needs:   . Lack of Transportation (Medical): Not on file  . Lack of Transportation (Non-Medical): Not on file  Physical Activity:   . Days of Exercise per Week: Not on file  . Minutes of Exercise per Session: Not on file  Stress:   . Feeling of Stress  : Not on file  Social Connections:   . Frequency of Communication with Friends and Family: Not on file  . Frequency of Social Gatherings with Friends and Family: Not on file  . Attends Religious Services: Not on file  . Active Member of Clubs or Organizations: Not on file  . Attends Archivist Meetings: Not on file  . Marital Status: Not on file     Family History: The patient's family history includes Alcoholism in her father; Hypertension in her father; Hypothyroidism in her mother; Rheum arthritis in her mother; Stroke in her maternal grandmother and mother; Suicidality in her brother.  ROS:   Review of Systems  Constitution: Negative for decreased appetite, fever and weight gain.  HENT: Negative for congestion, ear discharge, hoarse voice and sore throat.   Eyes: Negative for discharge, redness, vision loss in right eye and visual halos.  Cardiovascular: Negative for chest pain, dyspnea on exertion, leg swelling, orthopnea and palpitations.  Respiratory: Negative for cough, hemoptysis, shortness of breath and snoring.   Endocrine: Negative for heat intolerance and polyphagia.  Hematologic/Lymphatic: Negative for bleeding problem. Does not bruise/bleed easily.  Skin: Negative for flushing, nail changes, rash and suspicious lesions.  Musculoskeletal: Negative for arthritis, joint pain, muscle cramps, myalgias, neck pain and stiffness.  Gastrointestinal: Negative for abdominal pain, bowel incontinence, diarrhea and excessive appetite.  Genitourinary: Negative for decreased libido, genital sores and incomplete emptying.  Neurological: Negative for brief paralysis, focal weakness, headaches and loss of balance.  Psychiatric/Behavioral: Negative for altered mental status, depression and suicidal ideas.  Allergic/Immunologic: Negative for HIV exposure and persistent infections.    EKGs/Labs/Other Studies Reviewed:    The following studies were reviewed today:   EKG:  The  ekg ordered today demonstrates sinus rhythm, heart rate 80 bpm  Recent Labs: No results found for requested labs within last 8760 hours.  Recent Lipid Panel No results found for: CHOL, TRIG, HDL, CHOLHDL, VLDL, LDLCALC, LDLDIRECT  Physical Exam:    VS:  BP 126/70 (BP Location: Right Arm, Patient Position: Sitting, Cuff Size: Normal)   Pulse 80   Ht 5\' 1"  (1.549 m)   Wt 121 lb (54.9 kg)   SpO2 99%   BMI 22.86 kg/m     Wt Readings from Last 3 Encounters:  12/27/19 121 lb (54.9 kg)  01/17/15 133 lb 3 oz (60.4 kg)     GEN: Well nourished, well developed in no acute distress HEENT: Normal NECK: No JVD; No carotid bruits LYMPHATICS: No lymphadenopathy CARDIAC: S1S2 noted,RRR, 2/6 mid ejection murmurs, rubs, gallops RESPIRATORY:  Clear to auscultation without rales, wheezing or rhonchi  ABDOMEN: Soft, non-tender, non-distended, +bowel sounds, no guarding. EXTREMITIES: No edema, No cyanosis, no clubbing MUSCULOSKELETAL:  No deformity  SKIN: Warm and dry NEUROLOGIC:  Alert and oriented x 3, non-focal PSYCHIATRIC:  Normal affect, good insight  ASSESSMENT:    1. Murmur, cardiac   2. Type 1 diabetes mellitus with complications (HCC)   3. History of CVA (cerebrovascular accident)   4. CKD (chronic kidney disease) stage 5, GFR less than 15 ml/min (HCC)   5. Status post kidney transplant    PLAN:     I like to get an echocardiogram on this patient to assess her LV function as well as any structural abnormalities.  She does not have any symptoms concerning for angina.  Reviewed her lipid profile which was done on November 04, 2019 by her PCP shows HDL 94, LDL 69, total cholesterol 178, triglyceride 80.  She will remain on her simvastatin 40 mg daily.  No changes will be made today to any of her medication regimen.  Diabetes is being managed by her PCP. History of CVA continue aspirin and statin.  The patient is in agreement with the above plan. The patient left the office in stable  condition.  The patient will follow up in 2 months or sooner if needed.   Medication Adjustments/Labs and Tests Ordered: Current medicines are reviewed at length with the patient today.  Concerns regarding medicines are outlined above.  Orders Placed This Encounter  Procedures  . ECHOCARDIOGRAM COMPLETE   No orders of the defined types were placed in this encounter.   Patient Instructions  Medication Instructions:  *If you need a refill on your cardiac medications before your next appointment, please call your pharmacy*  Testing/Procedures: Your physician has requested that you have an echocardiogram. Echocardiography is a painless test that uses sound waves to create images of your heart. It provides your doctor with information about the size and shape of your heart and how well your heart's chambers and valves are working. This procedure takes approximately one hour. There are no restrictions for this procedure.  Follow-Up: At [facility], you and your health needs are our priority.  As part of our continuing mission to provide you with exceptional heart care, we have created designated Provider Care Teams.  These Care Teams include your primary Cardiologist (physician) and Advanced Practice Providers (APPs -  Physician Assistants and Nurse Practitioners) who all work together to provide you with the care you need, when you need it.  We recommend signing up for the patient portal called "MyChart".  Sign up information is provided on this After Visit Summary.  MyChart is used to connect with patients for Virtual Visits (Telemedicine).  Patients are able to view lab/test results, encounter notes, upcoming appointments, etc.  Non-urgent messages can be sent to your provider as well.   To learn more about what you can do with MyChart, go to [email or web address].    Your next appointment:   Your physician recommends that you schedule a follow-up appointment in: 2 MONTHS with Dr.  Harriet Masson.  The format for your next appointment:   In Person with Berniece Salines, DO        Adopting a Healthy Lifestyle.  Know what a healthy weight is for you (roughly BMI <25) and aim to maintain this   Aim for 7+ servings of fruits and vegetables daily   65-80+ fluid ounces of water or unsweet tea for healthy kidneys  Limit to max 1 drink of alcohol per day; avoid smoking/tobacco   Limit animal fats in diet for cholesterol and heart health - choose grass fed whenever available   Avoid highly processed foods, and foods high in saturated/trans fats   Aim for low stress - take time to unwind and care for your mental health   Aim for 150 min of moderate intensity exercise weekly for heart health, and weights twice weekly for bone health   Aim for 7-9 hours of sleep daily   When it comes to diets, agreement about the perfect plan isnt easy to find, even among the experts. Experts at the [employer] developed an idea known as the Healthy Eating Plate. Just imagine a plate divided into logical, healthy portions.   The emphasis is on diet quality:   Load up on vegetables and fruits - one-half of your plate: Aim for color and variety, and remember that potatoes dont count.   Go for whole grains - one-quarter of your plate: Whole wheat, barley, wheat berries, quinoa, oats, brown rice, and foods made with them. If you want pasta, go with whole wheat pasta.   Protein power - one-quarter of your plate: Fish, chicken, beans, and nuts are all healthy, versatile protein sources. Limit red meat.   The diet, however, does go beyond the plate, offering a few other suggestions.   Use healthy plant oils, such as olive, canola, soy, corn, sunflower and peanut. Check the labels, and avoid partially hydrogenated oil, which have unhealthy trans fats.   If youre thirsty, drink water. Coffee and tea are good in moderation, but skip sugary drinks and limit milk and dairy  products to one or two daily servings.   The type of carbohydrate in the diet is more important than the amount. Some sources of carbohydrates, such as vegetables, fruits, whole grains, and beans-are healthier than others.   Finally, stay active  Signed, Berniece Salines, DO  12/27/2019 9:41 AM    [place]

## 2019-12-27 NOTE — Patient Instructions (Signed)
Medication Instructions:  *If you need a refill on your cardiac medications before your next appointment, please call your pharmacy*  Testing/Procedures: Your physician has requested that you have an echocardiogram. Echocardiography is a painless test that uses sound waves to create images of your heart. It provides your doctor with information about the size and shape of your heart and how well your heart's chambers and valves are working. This procedure takes approximately one hour. There are no restrictions for this procedure.  Follow-Up: At [facility], you and your health needs are our priority.  As part of our continuing mission to provide you with exceptional heart care, we have created designated Provider Care Teams.  These Care Teams include your primary Cardiologist (physician) and Advanced Practice Providers (APPs -  Physician Assistants and Nurse Practitioners) who all work together to provide you with the care you need, when you need it.  We recommend signing up for the patient portal called "MyChart".  Sign up information is provided on this After Visit Summary.  MyChart is used to connect with patients for Virtual Visits (Telemedicine).  Patients are able to view lab/test results, encounter notes, upcoming appointments, etc.  Non-urgent messages can be sent to your provider as well.   To learn more about what you can do with MyChart, go to [email or web address].    Your next appointment:   Your physician recommends that you schedule a follow-up appointment in: 2 MONTHS with Dr. Harriet Masson.  The format for your next appointment:   In Person with Berniece Salines, DO

## 2019-12-27 NOTE — Addendum Note (Signed)
Addended by: Birder Robson on: 12/27/2019 11:27 AM   Modules accepted: Orders

## 2020-01-15 ENCOUNTER — Other Ambulatory Visit: Payer: Self-pay

## 2020-01-15 ENCOUNTER — Ambulatory Visit (INDEPENDENT_AMBULATORY_CARE_PROVIDER_SITE_OTHER): Payer: BC Managed Care – PPO

## 2020-01-15 DIAGNOSIS — R011 Cardiac murmur, unspecified: Secondary | ICD-10-CM

## 2020-01-15 LAB — ECHOCARDIOGRAM COMPLETE
Area-P 1/2: 3.48 cm2
S' Lateral: 2.6 cm

## 2020-01-15 NOTE — Progress Notes (Signed)
Complete echocardiogram performed.  Jimmy Brendalyn Vallely RDCS, RVT  

## 2020-01-16 ENCOUNTER — Telehealth: Payer: Self-pay

## 2020-01-16 NOTE — Telephone Encounter (Signed)
Spoke with patient regarding results and recommendation.  Patient verbalizes understanding and is agreeable to plan of care. Advised patient to call back with any issues or concerns.  

## 2020-01-16 NOTE — Telephone Encounter (Signed)
-----   Message from Berniece Salines, DO sent at 01/16/2020  1:45 PM EST ----- Normal echo

## 2020-02-27 ENCOUNTER — Ambulatory Visit: Payer: BC Managed Care – PPO | Admitting: Cardiology

## 2020-04-07 DIAGNOSIS — D849 Immunodeficiency, unspecified: Secondary | ICD-10-CM | POA: Insufficient documentation

## 2020-04-07 DIAGNOSIS — I10 Essential (primary) hypertension: Secondary | ICD-10-CM | POA: Insufficient documentation

## 2020-05-05 ENCOUNTER — Other Ambulatory Visit: Payer: BC Managed Care – PPO

## 2020-09-21 ENCOUNTER — Ambulatory Visit: Payer: BC Managed Care – PPO | Admitting: Podiatry

## 2020-10-01 ENCOUNTER — Encounter: Payer: Self-pay | Admitting: Podiatry

## 2020-10-01 ENCOUNTER — Ambulatory Visit: Payer: BC Managed Care – PPO

## 2020-10-01 ENCOUNTER — Other Ambulatory Visit: Payer: Self-pay

## 2020-10-01 ENCOUNTER — Ambulatory Visit (INDEPENDENT_AMBULATORY_CARE_PROVIDER_SITE_OTHER): Payer: BC Managed Care – PPO

## 2020-10-01 ENCOUNTER — Ambulatory Visit (INDEPENDENT_AMBULATORY_CARE_PROVIDER_SITE_OTHER): Payer: BC Managed Care – PPO | Admitting: Podiatry

## 2020-10-01 DIAGNOSIS — M7750 Other enthesopathy of unspecified foot: Secondary | ICD-10-CM | POA: Diagnosis not present

## 2020-10-01 DIAGNOSIS — M6240 Contracture of muscle, unspecified site: Secondary | ICD-10-CM | POA: Diagnosis not present

## 2020-10-01 DIAGNOSIS — M2041 Other hammer toe(s) (acquired), right foot: Secondary | ICD-10-CM

## 2020-10-01 DIAGNOSIS — M2042 Other hammer toe(s) (acquired), left foot: Secondary | ICD-10-CM

## 2020-10-01 NOTE — Progress Notes (Signed)
  Subjective:  Patient ID: Sherri Todd, female    DOB: [birth year],  MRN: [id number]  Chief Complaint  Patient presents with   Hammer Toe    The 5th toe on the right is hurting some and hurts to wear shoes   60 y.o. female presents with the above complaint. History confirmed with patient.  Has history of previous right fifth toe hammertoe correction but she said the deformity came back.  States that the toes are very painful and her feet are painful and swollen by the end of the day.  Works on her feet all day as a Microbiologist.  Has tried different shoe options without relief  Objective:  Physical Exam: warm, good capillary refill, no trophic changes or ulcerative lesions, normal DP and PT pulses, and normal sensory exam. Left Foot: pain to palpation about the fifth toe and fifth MPJ with prominent H PK's into the plantar fifth metatarsal head and DIPJ PIPJ of the fifth toe.  Reducible hammertoe deformity.  All digits have medial windswept deformity  Right Foot: Pain to palpation about the fifth toe and fifth MPJ with prominent H PK's into the plantar fifth metatarsal head and DIPJ PIPJ of the fifth toe.  Reducible hammertoe deformity.  All digits have medial windswept deformity  No images are attached to the encounter.  Radiographs: X-ray of the right foot: no fracture, dislocation, swelling or degenerative changes noted medial deviation of all digits and metatarsal with hammertoe deformities and adductovarus position of the lesser digits Assessment:   1. Hammertoe of right foot   2. Capsulitis of metatarsophalangeal (MTP) joint   3. Contracture of tendon sheath   4. Hammertoe of left foot      Plan:  Patient was evaluated and treated and all questions answered.  Hammertoes bilaterally with capsulitis and prominent fifth metatarsal head right -XR reviewed with patient -Educated on etiology of deformity -Discussed padding and shoe gear changes -Hammertoe crest pad  dispensed -Discussed with patient that they would benefit from surgical intervention for these issues  -Patient has failed all conservative therapy and wishes to proceed with surgical intervention. All risks, benefits, and alternatives discussed with patient. No guarantees given. Consent reviewed and signed by patient. -Planned procedures: Bilateral fifth toe hammertoe correction with flexor tenotomy and right foot fifth metatarsal head resection -ASA 2 - Patient with mild systemic disease with no functional limitations;  No follow-ups on file.

## 2020-10-14 ENCOUNTER — Other Ambulatory Visit: Payer: BC Managed Care – PPO

## 2020-10-21 ENCOUNTER — Telehealth: Payer: Self-pay | Admitting: Urology

## 2020-10-21 NOTE — Telephone Encounter (Signed)
DOS - 11/18/20  METATARSAL HEAD RESC. RIGHT --- 28113 HAMMERTOE REPAIR 5TH BILAT --- [id number] TENOTOMY 5TH BILAT --- 28010   BCBS EFFECTIVE DATE -    PLAN DEDUCTIBLE -  OUT OF POCKET -  COINSURANCE -  COPAY -    SPOKE WITH TERRI WITH [employer] AND SHE STATED FOR CPT CODES 52841, [phone] X'S 2 AND 32440 X'S 2 NO PRIOR AUTH IS REQUIRED.   REF # TERRI T 10/21/20 @ 4:26PM

## 2020-11-09 ENCOUNTER — Encounter: Payer: Self-pay | Admitting: Podiatry

## 2020-11-10 ENCOUNTER — Other Ambulatory Visit: Payer: Self-pay | Admitting: Family Medicine

## 2020-11-10 DIAGNOSIS — Z1231 Encounter for screening mammogram for malignant neoplasm of breast: Secondary | ICD-10-CM

## 2020-11-18 ENCOUNTER — Other Ambulatory Visit: Payer: Self-pay | Admitting: Podiatry

## 2020-11-18 ENCOUNTER — Encounter: Payer: Self-pay | Admitting: Podiatry

## 2020-11-18 DIAGNOSIS — M2041 Other hammer toe(s) (acquired), right foot: Secondary | ICD-10-CM | POA: Diagnosis not present

## 2020-11-18 DIAGNOSIS — M2042 Other hammer toe(s) (acquired), left foot: Secondary | ICD-10-CM | POA: Diagnosis not present

## 2020-11-18 DIAGNOSIS — M7751 Other enthesopathy of right foot: Secondary | ICD-10-CM | POA: Diagnosis not present

## 2020-11-18 MED ORDER — CEPHALEXIN 500 MG PO CAPS: ORAL_CAPSULE | ORAL | 0 refills | Status: DC

## 2020-11-18 MED ORDER — ACETAMINOPHEN-CODEINE #3 300-30 MG PO TABS: 1.0000 | ORAL_TABLET | ORAL | 0 refills | Status: DC | PRN

## 2020-11-18 MED ORDER — ONDANSETRON HCL 4 MG PO TABS: 4.0000 mg | ORAL_TABLET | Freq: Three times a day (TID) | ORAL | 0 refills | Status: DC | PRN

## 2020-11-18 NOTE — Progress Notes (Signed)
Rx sent to pharmacy for outpatient surgery. °

## 2020-11-24 ENCOUNTER — Other Ambulatory Visit: Payer: Self-pay | Admitting: Podiatry

## 2020-11-24 ENCOUNTER — Other Ambulatory Visit: Payer: Self-pay

## 2020-11-24 ENCOUNTER — Ambulatory Visit (INDEPENDENT_AMBULATORY_CARE_PROVIDER_SITE_OTHER): Payer: BC Managed Care – PPO

## 2020-11-24 ENCOUNTER — Ambulatory Visit (INDEPENDENT_AMBULATORY_CARE_PROVIDER_SITE_OTHER): Payer: BC Managed Care – PPO | Admitting: Podiatry

## 2020-11-24 DIAGNOSIS — M2042 Other hammer toe(s) (acquired), left foot: Secondary | ICD-10-CM

## 2020-11-24 DIAGNOSIS — M2041 Other hammer toe(s) (acquired), right foot: Secondary | ICD-10-CM

## 2020-11-24 NOTE — Progress Notes (Signed)
  Subjective:  Patient ID: Sherri Todd, female    DOB: [birth year],  MRN: [id number]  Chief Complaint  Patient presents with   Routine Post Op     (xray)POV #1 DOS 11/18/2020 B/L 5TH TOE HAMMERTOE CORRECTION, RT 5TH METATARSAL HEAD RESECTION INCLUDES TENOTOMY OF 5TH TOE TENDONS   DOS: 11/18/20 Procedure: B/l 5th hammertoe correction with bilateral 5th toe flexor tenotomy, right 5th metatarsal head resection   60 y.o. female presents with the above complaint. History confirmed with patient.   Objective:  Physical Exam: tenderness at the surgical site, local edema noted, and calf supple, nontender. Incision: healing well, no significant drainage, no dehiscence, no significant erythema  No images are attached to the encounter.  Radiographs: X-ray of the right foot: consistent with post-op state, with good alignment  Assessment:   1. Hammertoe of right foot   2. Hammertoe of left foot     Plan:  Patient was evaluated and treated and all questions answered.  Post-operative State -XR reviewed with patient -Ok to start showering at this time. Advised they cannot soak. -Dressing applied consisting of povidone, sterile gauze, kerlix, and ACE bandage -WBAT in Surgical shoe -XRs needed at follow-up: none   No follow-ups on file.

## 2020-12-01 ENCOUNTER — Telehealth: Payer: Self-pay | Admitting: *Deleted

## 2020-12-01 NOTE — Telephone Encounter (Signed)
Patient called and left a message asking how long does she need to wear the surgical shoe and I called the patient back and stated that as long as you have the stitches in, you have to wear them and when you see Dr March Rummage on 12/10/2020 ask then when you don't have to wear the surgical shoe any more. Sherri Todd

## 2020-12-08 ENCOUNTER — Encounter: Payer: BC Managed Care – PPO | Admitting: Podiatry

## 2020-12-10 ENCOUNTER — Ambulatory Visit (INDEPENDENT_AMBULATORY_CARE_PROVIDER_SITE_OTHER): Payer: BC Managed Care – PPO | Admitting: Podiatry

## 2020-12-10 ENCOUNTER — Encounter: Payer: Self-pay | Admitting: Podiatry

## 2020-12-10 DIAGNOSIS — M2041 Other hammer toe(s) (acquired), right foot: Secondary | ICD-10-CM

## 2020-12-10 DIAGNOSIS — M2042 Other hammer toe(s) (acquired), left foot: Secondary | ICD-10-CM

## 2020-12-14 ENCOUNTER — Other Ambulatory Visit: Payer: Self-pay

## 2020-12-14 ENCOUNTER — Ambulatory Visit
Admission: RE | Admit: 2020-12-14 | Discharge: 2020-12-14 | Disposition: A | Discharge status: Home / Self Care | Payer: BC Managed Care – PPO | Source: Ambulatory Visit | Attending: Family Medicine | Admitting: Family Medicine

## 2020-12-14 ENCOUNTER — Ambulatory Visit
Admission: RE | Admit: 2020-12-14 | Discharge: 2020-12-14 | Disposition: A | Discharge status: Home / Self Care | Payer: BC Managed Care – PPO | Source: Ambulatory Visit

## 2020-12-14 DIAGNOSIS — Z1231 Encounter for screening mammogram for malignant neoplasm of breast: Secondary | ICD-10-CM

## 2020-12-17 ENCOUNTER — Other Ambulatory Visit: Payer: Self-pay

## 2020-12-17 ENCOUNTER — Ambulatory Visit (INDEPENDENT_AMBULATORY_CARE_PROVIDER_SITE_OTHER): Payer: BC Managed Care – PPO | Admitting: Podiatry

## 2020-12-17 DIAGNOSIS — M2041 Other hammer toe(s) (acquired), right foot: Secondary | ICD-10-CM

## 2020-12-17 DIAGNOSIS — L89620 Pressure ulcer of left heel, unstageable: Secondary | ICD-10-CM

## 2020-12-17 DIAGNOSIS — M2042 Other hammer toe(s) (acquired), left foot: Secondary | ICD-10-CM

## 2020-12-17 NOTE — Progress Notes (Signed)
  Subjective:  Patient ID: Sherri Todd, female    DOB: [birth year],  MRN: [id number]  Chief Complaint  Patient presents with   Routine Post Op    I am doing better and I am a little tired    DOS: 11/18/20 Procedure: B/l 5th hammertoe correction with bilateral 5th toe flexor tenotomy, right 5th metatarsal head resection   60 y.o. female presents with the above complaint. History confirmed with patient.   Objective:  Physical Exam: tenderness at the surgical site, local edema noted, and calf supple, nontender. Incision: healing well, no significant drainage, no dehiscence, no significant erythema Assessment:   1. Hammertoe of right foot   2. Hammertoe of left foot    Plan:  Patient was evaluated and treated and all questions answered.  Post-operative State -Sutures removed -Ok to start showering at this time. Advised they cannot soak. -Continue surgical shoes. Ok to [employer] in normal shoegear if they do not put pressure on the toes. -XRs needed at follow-up: none  Return in about 3 weeks (around 12/31/2020) for Post-Op (No XRs).

## 2020-12-21 NOTE — Progress Notes (Signed)
  Subjective:  Patient ID: Sherri Todd, female    DOB: 07-03-1960,  MRN: [id number]  No chief complaint on file.  DOS: 11/18/20 Procedure: B/l 5th hammertoe correction with bilateral 5th toe flexor tenotomy, right 5th metatarsal head resection   60 y.o. female presents with the above complaint. History confirmed with patient.  States that the right foot is doing well she is having some swelling or elevating it as Piercey described.  Noticed last week a wound to the back of the left heel has been applying Santyl and bandage states that the wound did have some drainage.  Objective:  Physical Exam: tenderness at the surgical site, local edema noted, and calf supple, nontender. Incision: healed  2 x 1 dry fibrotic wound to the posterior left heel without warmth erythema signs of acute infection Assessment:   1. Hammertoe of right foot   2. Hammertoe of left foot   3. Pressure ulcer of heel, left, unstageable ([place])     Plan:  Patient was evaluated and treated and all questions answered.  Post-operative State -Sutures removed -Ok to start showering at this time. Advised they cannot soak. -Continue surgical shoes. Ok to [employer] in normal shoegear if they do not put pressure on the toes. -XRs needed at follow-up: none  Heel wound left -No debridement of the wound indicated today -Dressed with Silvadene and dry sterile dressing -Advised patient she can use Santyl on the wound she should apply WTD dressing to the wound to activate the santyl   No follow-ups on file.

## 2021-01-07 ENCOUNTER — Other Ambulatory Visit: Payer: Self-pay | Admitting: *Deleted

## 2021-01-07 ENCOUNTER — Other Ambulatory Visit: Payer: Self-pay

## 2021-01-07 ENCOUNTER — Ambulatory Visit (INDEPENDENT_AMBULATORY_CARE_PROVIDER_SITE_OTHER): Payer: BC Managed Care – PPO | Admitting: Podiatry

## 2021-01-07 ENCOUNTER — Encounter: Payer: Self-pay | Admitting: Podiatry

## 2021-01-07 DIAGNOSIS — L89620 Pressure ulcer of left heel, unstageable: Secondary | ICD-10-CM | POA: Diagnosis not present

## 2021-01-07 DIAGNOSIS — Z780 Asymptomatic menopausal state: Secondary | ICD-10-CM | POA: Insufficient documentation

## 2021-01-07 DIAGNOSIS — M1990 Unspecified osteoarthritis, unspecified site: Secondary | ICD-10-CM | POA: Insufficient documentation

## 2021-01-07 DIAGNOSIS — J45909 Unspecified asthma, uncomplicated: Secondary | ICD-10-CM | POA: Insufficient documentation

## 2021-01-07 DIAGNOSIS — A6000 Herpesviral infection of urogenital system, unspecified: Secondary | ICD-10-CM | POA: Insufficient documentation

## 2021-01-07 DIAGNOSIS — N186 End stage renal disease: Secondary | ICD-10-CM | POA: Insufficient documentation

## 2021-01-07 DIAGNOSIS — F325 Major depressive disorder, single episode, in full remission: Secondary | ICD-10-CM | POA: Insufficient documentation

## 2021-01-07 DIAGNOSIS — J309 Allergic rhinitis, unspecified: Secondary | ICD-10-CM | POA: Insufficient documentation

## 2021-01-07 DIAGNOSIS — D509 Iron deficiency anemia, unspecified: Secondary | ICD-10-CM | POA: Insufficient documentation

## 2021-01-07 NOTE — Progress Notes (Signed)
  Subjective:  Patient ID: Sherri Todd, female    DOB: Jan 09, 1961,  MRN: [id number]  Chief Complaint  Patient presents with   Routine Post Op    I am doing great on both toes and I am back in regular shoes    DOS: 11/18/20 Procedure: B/l 5th hammertoe correction with bilateral 5th toe flexor tenotomy, right 5th metatarsal head resection   60 y.o. female presents with the above complaint. History confirmed with patient. States the heel has scabbed over and she has another area on the heel that is irritated. Toes doing very well denies pain or discomfort at these areas.  Objective:  Physical Exam: no tenderness at the surgical site, local edema noted, and calf supple, nontender. Incision: healed  Heel wound appears healed Assessment:   1. Pressure ulcer of heel, left, unstageable ([place])      Plan:  Patient was evaluated and treated and all questions answered.  Post-operative State -Healing well, denies pain or discomfort. Tolerating normal shoes well. Will allow full activity without restrictions. F/u in 6 weeks for final recheck  Heel wound left -Appears healed   No follow-ups on file.

## 2021-02-18 ENCOUNTER — Ambulatory Visit (INDEPENDENT_AMBULATORY_CARE_PROVIDER_SITE_OTHER): Payer: BC Managed Care – PPO | Admitting: Podiatry

## 2021-02-18 ENCOUNTER — Encounter: Payer: Self-pay | Admitting: Podiatry

## 2021-02-18 DIAGNOSIS — M2041 Other hammer toe(s) (acquired), right foot: Secondary | ICD-10-CM

## 2021-02-18 NOTE — Progress Notes (Signed)
°  Subjective:  Patient ID: Sherri Todd, female    DOB: 1960/04/15,  MRN: [id number]  Chief Complaint  Patient presents with   Routine Post Op    I am doing fine and there is no issues on the right foot     DOS: 11/18/20 Procedure: B/l 5th hammertoe correction with bilateral 5th toe flexor tenotomy, right 5th metatarsal head resection   60 y.o. female presents with the above complaint. History confirmed with patient.  Denies pain or issues able to tolerate normal shoe gear without any difficulty  Objective:  Physical Exam: no tenderness at the surgical site, no edema noted, calf supple, nontender, and toes rectus. Incision: healed  Heel wound appears healed Assessment:   1. Hammertoe of right foot    Plan:  Patient was evaluated and treated and all questions answered.  Post-operative State -Doing very well denies issues.  At this point will DC without restrictions and patient can follow-up if any issues persist Heel wound left -Appears healed   No follow-ups on file.

## 2021-11-12 ENCOUNTER — Other Ambulatory Visit: Payer: Self-pay | Admitting: Family Medicine

## 2021-11-12 DIAGNOSIS — Z1231 Encounter for screening mammogram for malignant neoplasm of breast: Secondary | ICD-10-CM

## 2021-12-17 ENCOUNTER — Ambulatory Visit
Admission: RE | Admit: 2021-12-17 | Discharge: 2021-12-17 | Disposition: A | Discharge status: Home / Self Care | Payer: Commercial Managed Care - PPO | Source: Ambulatory Visit | Attending: Family Medicine | Admitting: Family Medicine

## 2021-12-17 DIAGNOSIS — Z1231 Encounter for screening mammogram for malignant neoplasm of breast: Secondary | ICD-10-CM

## 2021-12-21 IMAGING — MG DIGITAL SCREENING BILAT W/ CAD
4 series · 4 of 4 positions shown · non-contrast
Comparison: Previous exam(s).

CLINICAL DATA: Screening.

EXAM:
DIGITAL SCREENING BILATERAL MAMMOGRAM WITH CAD

[R CC]
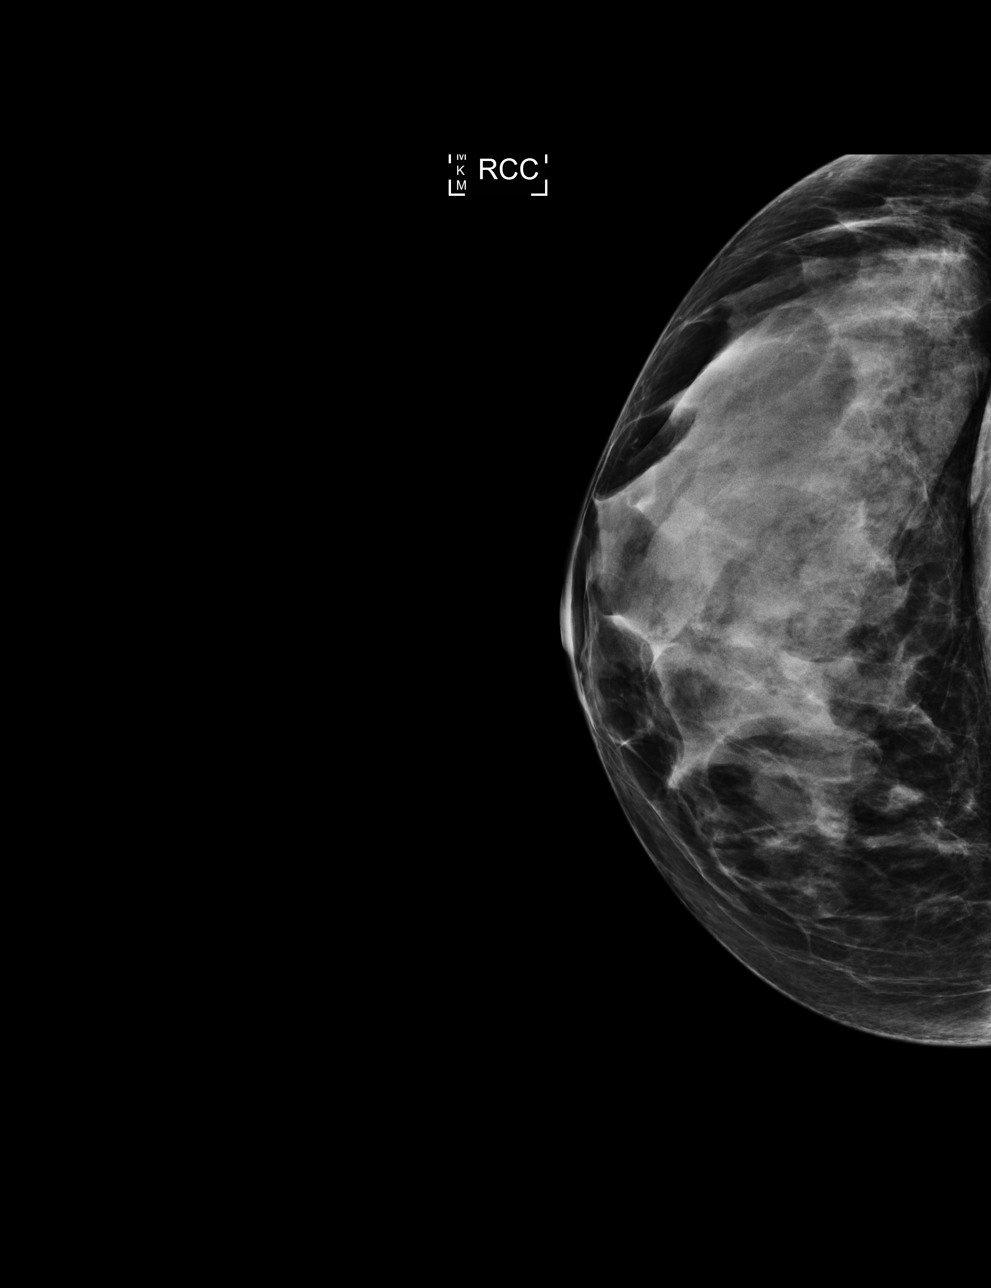

[R MLO]
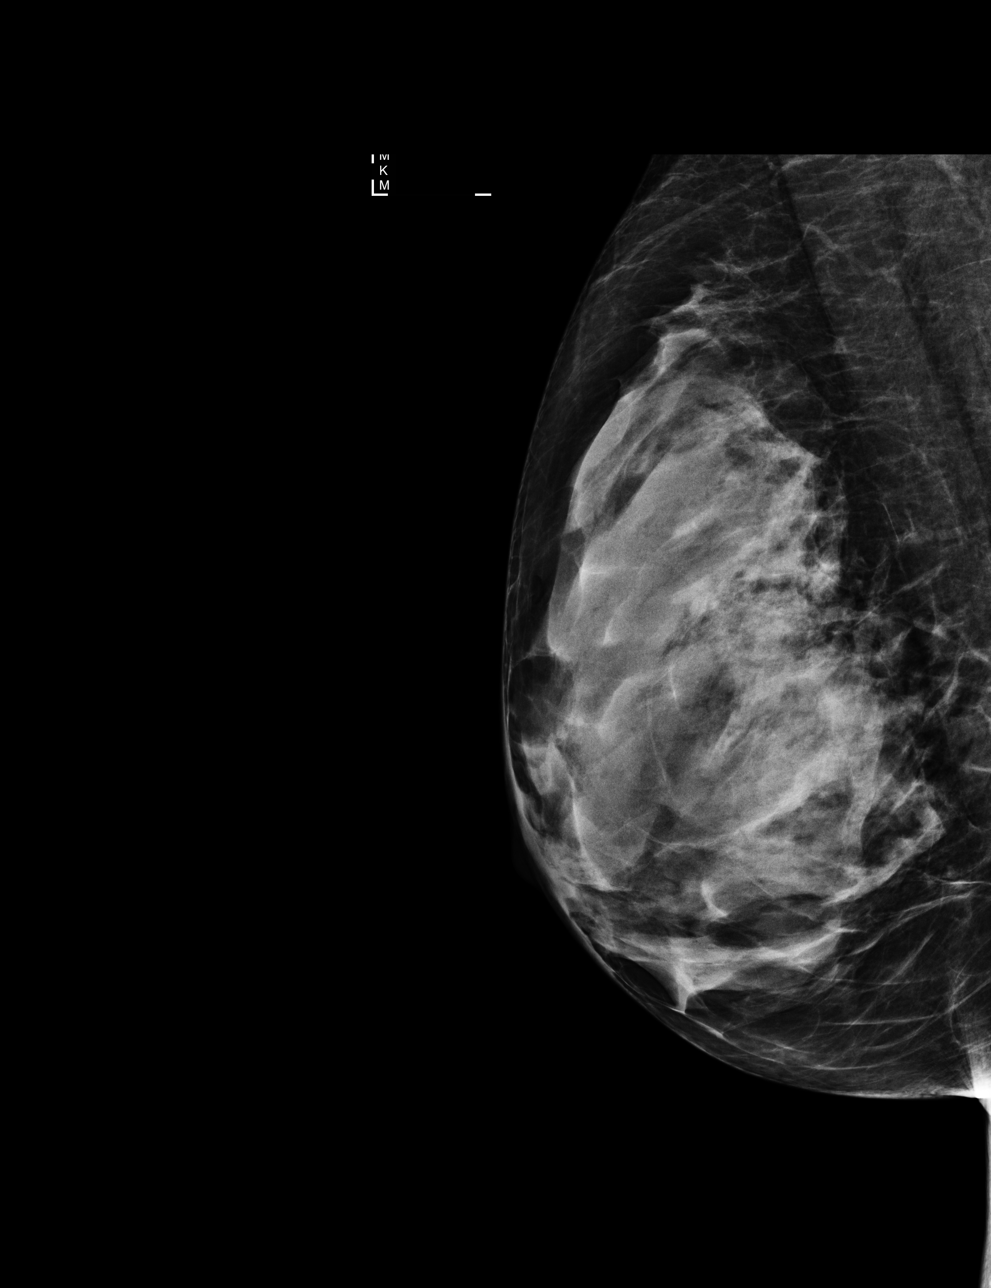

[L CC]
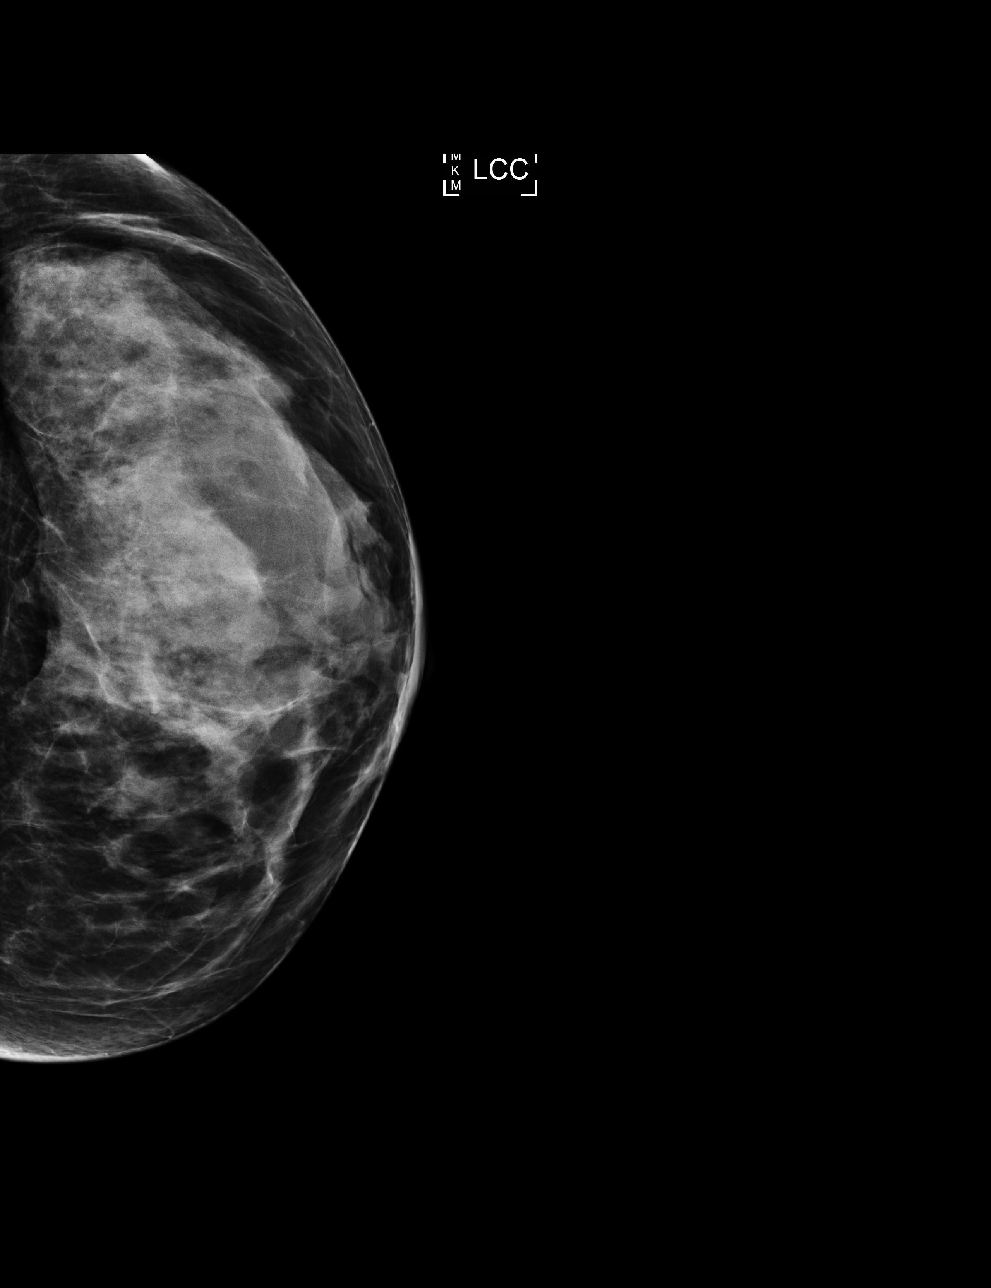

[L MLO]
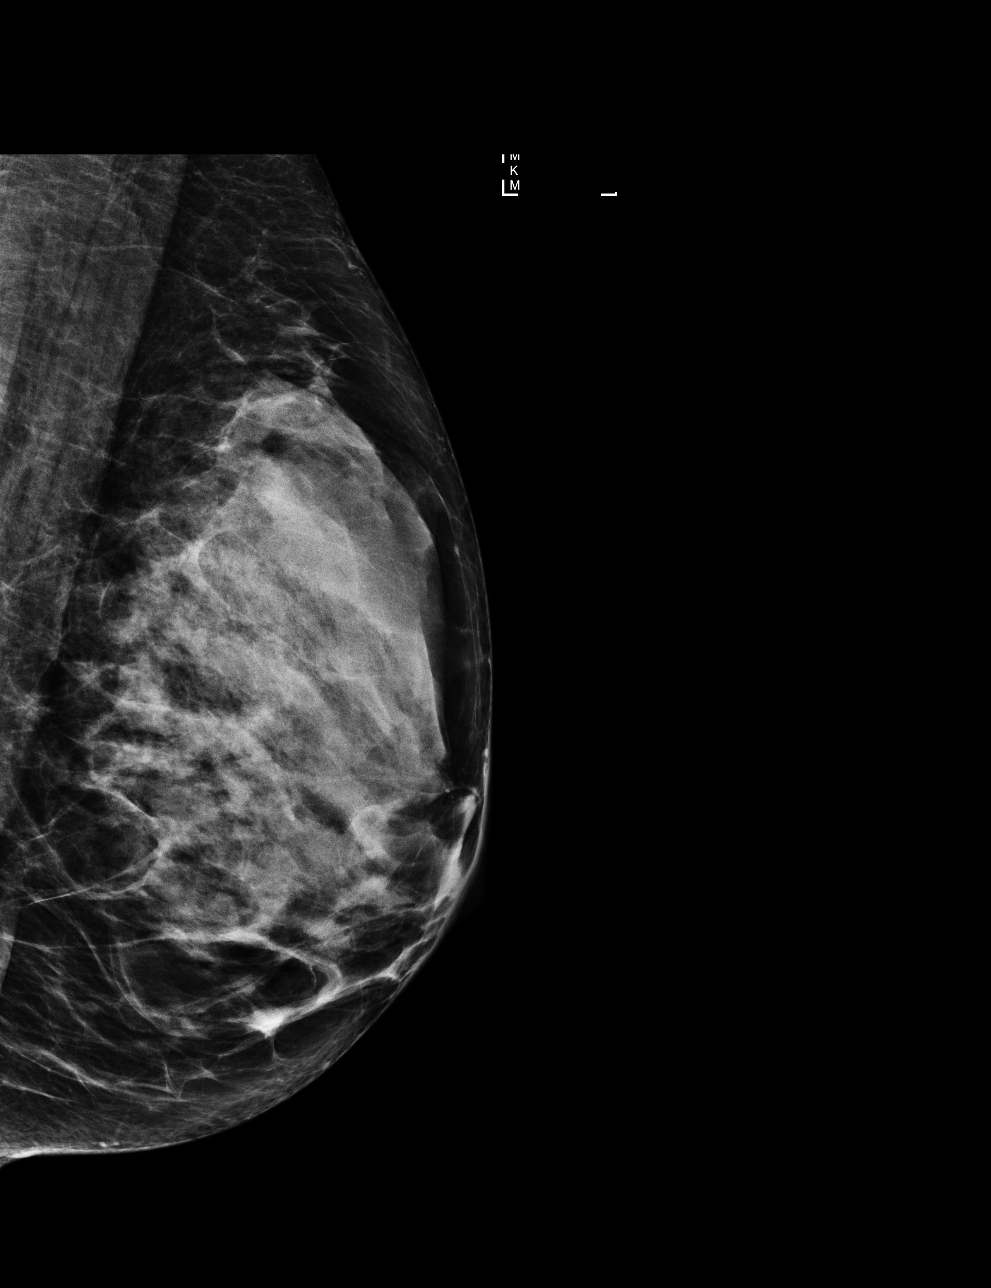

[4 of 4 positions shown; findings below may reference images not displayed]

ACR Breast Density Category d: The breast tissue is extremely dense,
which lowers the sensitivity of mammography
FINDINGS: There are no findings suspicious for malignancy. Images were
processed with CAD.
IMPRESSION: No mammographic evidence of malignancy. A result letter of this
screening mammogram will be mailed directly to the patient.

RECOMMENDATION:
Screening mammogram in one year. (Code:A5-2-HPS)

BI-RADS CATEGORY  1: Negative.

## 2022-05-16 ENCOUNTER — Encounter (HOSPITAL_COMMUNITY): Payer: Self-pay

## 2022-05-16 ENCOUNTER — Emergency Department (HOSPITAL_COMMUNITY): Payer: Commercial Managed Care - PPO

## 2022-05-16 ENCOUNTER — Other Ambulatory Visit: Payer: Self-pay

## 2022-05-16 ENCOUNTER — Emergency Department (HOSPITAL_COMMUNITY)
Admission: EM | Admit: 2022-05-16 | Discharge: 2022-05-17 | Disposition: A | Discharge status: Home / Self Care | Payer: Commercial Managed Care - PPO | Attending: Emergency Medicine | Admitting: Emergency Medicine

## 2022-05-16 DIAGNOSIS — R112 Nausea with vomiting, unspecified: Secondary | ICD-10-CM

## 2022-05-16 DIAGNOSIS — U071 COVID-19: Secondary | ICD-10-CM

## 2022-05-16 DIAGNOSIS — D72829 Elevated white blood cell count, unspecified: Secondary | ICD-10-CM | POA: Diagnosis not present

## 2022-05-16 DIAGNOSIS — Z7982 Long term (current) use of aspirin: Secondary | ICD-10-CM | POA: Insufficient documentation

## 2022-05-16 DIAGNOSIS — R7989 Other specified abnormal findings of blood chemistry: Secondary | ICD-10-CM

## 2022-05-16 DIAGNOSIS — T50905A Adverse effect of unspecified drugs, medicaments and biological substances, initial encounter: Secondary | ICD-10-CM

## 2022-05-16 DIAGNOSIS — N3000 Acute cystitis without hematuria: Secondary | ICD-10-CM

## 2022-05-16 DIAGNOSIS — R1110 Vomiting, unspecified: Secondary | ICD-10-CM | POA: Diagnosis present

## 2022-05-16 LAB — CBC WITH DIFFERENTIAL/PLATELET
Abs Immature Granulocytes: 0.05 10*3/uL (ref 0.00–0.07)
Basophils Absolute: 0 10*3/uL (ref 0.0–0.1)
Basophils Relative: 0 %
Eosinophils Absolute: 0.2 10*3/uL (ref 0.0–0.5)
Eosinophils Relative: 1 %
HCT: 38 % (ref 36.0–46.0)
Hemoglobin: 12.1 g/dL (ref 12.0–15.0)
Immature Granulocytes: 0 %
Lymphocytes Relative: 7 %
Lymphs Abs: 0.9 10*3/uL (ref 0.7–4.0)
MCH: 30 pg (ref 26.0–34.0)
MCHC: 31.8 g/dL (ref 30.0–36.0)
MCV: 94.3 fL (ref 80.0–100.0)
Monocytes Absolute: 0.9 10*3/uL (ref 0.1–1.0)
Monocytes Relative: 8 %
Neutro Abs: 9.8 10*3/uL — ABNORMAL HIGH (ref 1.7–7.7)
Neutrophils Relative %: 84 %
Platelets: 289 10*3/uL (ref 150–400)
RBC: 4.03 MIL/uL (ref 3.87–5.11)
RDW: 14.4 % (ref 11.5–15.5)
WBC: 11.8 10*3/uL — ABNORMAL HIGH (ref 4.0–10.5)
nRBC: 0 % (ref 0.0–0.2)

## 2022-05-16 LAB — COMPREHENSIVE METABOLIC PANEL
ALT: 15 U/L (ref 0–44)
AST: 25 U/L (ref 15–41)
Albumin: 3.6 g/dL (ref 3.5–5.0)
Alkaline Phosphatase: 69 U/L (ref 38–126)
Anion gap: 12 (ref 5–15)
BUN: 20 mg/dL (ref 6–20)
CO2: 24 mmol/L (ref 22–32)
Calcium: 9.9 mg/dL (ref 8.9–10.3)
Chloride: 98 mmol/L (ref 98–111)
Creatinine, Ser: 1.43 mg/dL — ABNORMAL HIGH (ref 0.44–1.00)
GFR, Estimated: 42 mL/min — ABNORMAL LOW (ref >60)
Glucose, Bld: 123 mg/dL — ABNORMAL HIGH (ref 70–99)
Potassium: 4 mmol/L (ref 3.5–5.1)
Sodium: 134 mmol/L — ABNORMAL LOW (ref 135–145)
Total Bilirubin: 0.4 mg/dL (ref 0.3–1.2)
Total Protein: 6.7 g/dL (ref 6.5–8.1)

## 2022-05-16 MED ORDER — ONDANSETRON 4 MG PO TBDP: 4.0000 mg | ORAL_TABLET | Freq: Once | ORAL | Status: DC

## 2022-05-16 NOTE — ED Provider Triage Note (Signed)
Emergency Medicine Provider Triage Evaluation Note  Sherri Todd , a 62 y.o. female  was evaluated in triage.  Pt complains of COVID last Tueday, given Paxlovid, took x 2 days then began vomiting. 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  Urine normal Review of Systems  Positive: Vomiting, diarrhea Negative:   Physical Exam  BP 136/88   Pulse 90   Temp 97.9 F (36.6 C) (Oral)   Resp 16   Ht '5\' 4"'$  (1.626 m)   Wt 54.9 kg   SpO2 100%   BMI 20.77 kg/m  Gen:   Awake, no distress   Resp:  Normal effort  MSK:   Moves extremities without difficulty  Other:    Medical Decision Making  Medically screening exam initiated at 8:01 PM.  Appropriate orders placed.  Calley Zurfluh Sayler was informed that the remainder of the evaluation will be completed by another provider, this initial triage assessment does not replace that evaluation, and the importance of remaining in the ED until their evaluation is complete.     Tacy Learn, PA-C 05/16/22 2002

## 2022-05-16 NOTE — ED Triage Notes (Signed)
Pt reports she was diagnosed with Covid-19 on 03/04 and was prescribed Paxlovid. She took it for two days but then started vomiting and states she has not eaten in 3 days. She is unable to hold anything down, including her transplant meds.

## 2022-05-17 ENCOUNTER — Encounter (HOSPITAL_COMMUNITY): Payer: Self-pay | Admitting: Emergency Medicine

## 2022-05-17 LAB — URINALYSIS, ROUTINE W REFLEX MICROSCOPIC
Bilirubin Urine: NEGATIVE
Glucose, UA: NEGATIVE mg/dL
Hgb urine dipstick: NEGATIVE
Ketones, ur: NEGATIVE mg/dL
Nitrite: NEGATIVE
Protein, ur: NEGATIVE mg/dL
Specific Gravity, Urine: 1.006 (ref 1.005–1.030)
WBC, UA: >50 WBC/hpf (ref 0–5)
pH: 6 (ref 5.0–8.0)

## 2022-05-17 MED ORDER — SODIUM CHLORIDE 0.9 % IV BOLUS: 500.0000 mL | Freq: Once | INTRAVENOUS | Status: DC

## 2022-05-17 MED ORDER — SODIUM CHLORIDE 0.9 % IV SOLN
1.0000 g | Freq: Once | INTRAVENOUS | Status: AC
  Administered 2022-05-17: 1 g via INTRAVENOUS
  Filled 2022-05-17: qty 10

## 2022-05-17 MED ORDER — SODIUM CHLORIDE 0.9 % IV BOLUS
1000.0000 mL | Freq: Once | INTRAVENOUS | Status: AC
  Administered 2022-05-17: 1000 mL via INTRAVENOUS

## 2022-05-17 MED ORDER — CEPHALEXIN 500 MG PO CAPS: 500.0000 mg | ORAL_CAPSULE | Freq: Two times a day (BID) | ORAL | 0 refills | Status: DC

## 2022-05-17 MED ORDER — SODIUM CHLORIDE 0.9 % IV BOLUS
500.0000 mL | Freq: Once | INTRAVENOUS | Status: AC
  Administered 2022-05-17: 500 mL via INTRAVENOUS

## 2022-05-17 MED ORDER — ONDANSETRON 8 MG PO TBDP: ORAL_TABLET | ORAL | 0 refills | Status: DC

## 2022-05-17 MED ORDER — ONDANSETRON HCL 4 MG/2ML IJ SOLN
4.0000 mg | Freq: Once | INTRAMUSCULAR | Status: AC
  Administered 2022-05-17: 4 mg via INTRAVENOUS
  Filled 2022-05-17: qty 2

## 2022-05-17 NOTE — ED Provider Notes (Addendum)
[facility] Provider Note   CSN: [id number] Arrival date & time: 05/16/22  1928     History  Chief Complaint  Patient presents with   Emesis   Covid-19    Sherri Todd is a 62 y.o. female.  The history is provided by the patient.  Emesis Duration:  4 days Timing:  Intermittent Quality:  Stomach contents Progression:  Unchanged Chronicity:  New Context: not post-tussive   Relieved by:  Nothing Worsened by:  Nothing Ineffective treatments:  None tried Associated symptoms: cough, myalgias and URI   Associated symptoms: no diarrhea and no fever   Risk factors: alcohol use   Patient s/p renal and pancreas transplant at [place] presents having been diagnosed with Covid last Tuesday (symptoms of cough and body aches started on Monday).  Placed on Paxlovid and then began having nausea and vomiting Thursday into Friday.  Stopped medication on Friday.  Symptoms persist.  Has no have blood work for transplant since November.  No emesis in the ED.       Home Medications Prior to Admission medications   Medication Sig Start Date End Date Taking? Authorizing Provider  ondansetron (ZOFRAN-ODT) 8 MG disintegrating tablet '8mg'$  ODT q8hours prn nausea 05/17/22  Yes Pravin Perezperez, MD  acetaminophen-codeine (TYLENOL #3) 300-30 MG tablet Take 1 tablet by mouth every 4 (four) hours as needed for moderate pain. 11/18/20   Evelina Bucy, DPM  acyclovir (ZOVIRAX) 200 MG/5ML suspension Take by mouth in the morning and at bedtime.    [provider]  albuterol (PROAIR HFA) 108 (90 Base) MCG/ACT inhaler 2 puffs as needed 02/13/15   [provider]  amoxicillin (AMOXIL) 500 MG capsule Take 1,000 mg by mouth 2 (two) times daily. 11/16/20   [provider]  Ascorbic Acid (VITAMIN C) 500 MG CAPS 1 capsule    [provider]  ascorbic acid (VITAMIN C) 500 MG tablet Take by mouth daily.    [provider]  aspirin  81 MG chewable tablet 1 tablet    [provider]  aspirin 81 MG EC tablet Take 81 mg by mouth daily.    [provider]  Boswellia-Glucosamine-Vit D (OSTEO BI-FLEX-GLUCOS/5-LOXIN) TABS Take by mouth.    [provider]  calcium carbonate (OSCAL) 1500 (600 Ca) MG TABS tablet     [provider]  calcium carbonate (TUMS EX) 750 MG chewable tablet Chew by mouth.    [provider]  cephALEXin (KEFLEX) 500 MG capsule Take 1 tablet by mouth tonight, then one tomorrow AM and one in PM 11/18/20   Price, Christian Mate, DPM  Cetirizine HCl 10 MG CAPS Take 10 mg by mouth daily.    [provider]  Cholecalciferol (VITAMIN D3) 50 MCG (2000 UT) capsule 1 capsule    [provider]  Cholecalciferol 125 MCG (5000 UT) TABS Take 25 Units by mouth daily.     [provider]  diltiazem (CARDIZEM CD) 240 MG 24 hr capsule Take 240 mg by mouth daily. 09/24/19   [provider]  diltiazem (DILACOR XR) 240 MG 24 hr capsule 1 capsule on an empty stomach in the morning    [provider]  diltiazem (TIAZAC) 240 MG 24 hr capsule TAKE 1 CAPSULE(240 MG) BY MOUTH EVERY DAY 03/18/20   [provider]  FLUoxetine (PROZAC) 20 MG capsule Take 1 capsule by mouth daily. 09/26/12   [provider]  FLUoxetine (PROZAC) 20 MG  capsule 1 capsule    [provider]  montelukast (SINGULAIR) 10 MG tablet Take 10 mg by mouth daily. 11/25/20   [provider]  montelukast (SINGULAIR) 10 MG tablet 1 tablet 11/25/20   [provider]  Multiple Vitamin (MULTIVITAMIN) capsule Take 1 capsule by mouth daily.    [provider]  mupirocin ointment (BACTROBAN) 2 % SMARTSIG:1 Application Topical 2-3 Times Daily 12/24/20   [provider]  mycophenolate (CELLCEPT) 250 MG capsule Take 750 mg by mouth 2 (two) times daily. 08/15/19   [provider]  mycophenolate (CELLCEPT) 250 MG capsule Take by mouth.  09/14/20   [provider]  mycophenolate (CELLCEPT) 250 MG capsule 3 tablets    [provider]  Omega-3 Fatty Acids (FP FISH OIL PO) Take 1 tablet by mouth daily. 12/04/09   [provider]  ondansetron (ZOFRAN) 4 MG tablet Take 1 tablet (4 mg total) by mouth every 8 (eight) hours as needed for nausea or vomiting. 11/18/20   Evelina Bucy, DPM  POLY-IRON 150 150 MG capsule Take 150 mg by mouth at bedtime. 09/22/19   [provider]  polyethylene glycol powder (GLYCOLAX/MIRALAX) 17 GM/SCOOP powder Take by mouth as needed.    [provider]  predniSONE (DELTASONE) 5 MG tablet Take 5 mg by mouth daily. 08/13/19   [provider]  simvastatin (ZOCOR) 40 MG tablet Take 40 mg by mouth at bedtime. 08/07/19   [provider]  tacrolimus (PROGRAF) 0.5 MG capsule Take by mouth. 09/01/19   [provider]  tacrolimus (PROGRAF) 0.5 MG capsule Take by mouth. 08/26/20   [provider]  tacrolimus (PROGRAF) 1 MG capsule 1 tablet in the AM and 1 tablet in PM    [provider]  valACYclovir (VALTREX) 500 MG tablet TAKE 1 CAPLET BY MOUTH TWICE DAILY FOR 3 DAYS AS NEEDED FOR OUTBREAK 12/18/18   [provider]      Allergies    Ferric oxide, Gluten meal, Iron dextran, Iron sucrose, Soy allergy, and Wheat    Review of Systems   Review of Systems  Constitutional:  Negative for fever.  HENT:  Negative for facial swelling.   Respiratory:  Positive for cough. Negative for shortness of breath, wheezing and stridor.   Cardiovascular:  Negative for chest pain, palpitations and leg swelling.  Gastrointestinal:  Positive for nausea and vomiting. Negative for diarrhea.  Musculoskeletal:  Positive for myalgias.  All other systems reviewed and are negative.   Physical Exam Updated Vital Signs BP (!) 143/86   Pulse 95   Temp 98 F (36.7 C) (Oral)   Resp 17   Ht '5\' 4"'$  (1.626 m)   Wt 54.9 kg   SpO2 99%   BMI 20.77  kg/m  Physical Exam Vitals and nursing note reviewed. Exam conducted with a chaperone present.  Constitutional:      General: She is not in acute distress.    Appearance: Normal appearance. She is well-developed.  HENT:     Head: Normocephalic and atraumatic.     Nose: Nose normal.  Eyes:     Pupils: Pupils are equal, round, and reactive to light.  Cardiovascular:     Rate and Rhythm: Normal rate and regular rhythm.     Pulses: Normal pulses.     Heart sounds: Normal heart sounds.  Pulmonary:     Effort: Pulmonary effort is normal. No respiratory distress.     Breath sounds: Normal breath sounds.  Abdominal:  General: Bowel sounds are normal. There is no distension.     Palpations: Abdomen is soft.     Tenderness: There is no abdominal tenderness. There is no guarding or rebound.  Genitourinary:    Vagina: No vaginal discharge.  Musculoskeletal:        General: Normal range of motion.     Cervical back: Normal range of motion and neck supple.  Skin:    General: Skin is warm and dry.     Capillary Refill: Capillary refill takes less than 2 seconds.     Findings: No erythema or rash.  Neurological:     General: No focal deficit present.     Mental Status: She is alert and oriented to person, place, and time.     Deep Tendon Reflexes: Reflexes normal.  Psychiatric:        Mood and Affect: Mood normal.     ED Results / Procedures / Treatments   Labs (all labs ordered are listed, but only abnormal results are displayed) Results for orders placed or performed during the hospital encounter of 05/16/22  CBC with Differential  Result Value Ref Range   WBC 11.8 (H) 4.0 - 10.5 K/uL   RBC 4.03 3.87 - 5.11 MIL/uL   Hemoglobin 12.1 12.0 - 15.0 g/dL   HCT 38.0 36.0 - 46.0 %   MCV 94.3 80.0 - 100.0 fL   MCH 30.0 26.0 - 34.0 pg   MCHC 31.8 30.0 - 36.0 g/dL   RDW 14.4 11.5 - 15.5 %   Platelets 289 150 - 400 K/uL   nRBC 0.0 0.0 - 0.2 %   Neutrophils Relative % 84 %   Neutro  Abs 9.8 (H) 1.7 - 7.7 K/uL   Lymphocytes Relative 7 %   Lymphs Abs 0.9 0.7 - 4.0 K/uL   Monocytes Relative 8 %   Monocytes Absolute 0.9 0.1 - 1.0 K/uL   Eosinophils Relative 1 %   Eosinophils Absolute 0.2 0.0 - 0.5 K/uL   Basophils Relative 0 %   Basophils Absolute 0.0 0.0 - 0.1 K/uL   Immature Granulocytes 0 %   Abs Immature Granulocytes 0.05 0.00 - 0.07 K/uL  Comprehensive metabolic panel  Result Value Ref Range   Sodium 134 (L) 135 - 145 mmol/L   Potassium 4.0 3.5 - 5.1 mmol/L   Chloride 98 98 - 111 mmol/L   CO2 24 22 - 32 mmol/L   Glucose, Bld 123 (H) 70 - 99 mg/dL   BUN 20 6 - 20 mg/dL   Creatinine, Ser 1.43 (H) 0.44 - 1.00 mg/dL   Calcium 9.9 8.9 - 10.3 mg/dL   Total Protein 6.7 6.5 - 8.1 g/dL   Albumin 3.6 3.5 - 5.0 g/dL   AST 25 15 - 41 U/L   ALT 15 0 - 44 U/L   Alkaline Phosphatase 69 38 - 126 U/L   Total Bilirubin 0.4 0.3 - 1.2 mg/dL   GFR, Estimated 42 (L) >60 mL/min   Anion gap 12 5 - 15   DG Chest 2 View  Result Date: 05/16/2022 CLINICAL DATA:  COVID and vomiting. EXAM: CHEST - 2 VIEW COMPARISON:  September 02, 2013 FINDINGS: The heart size and mediastinal contours are within normal limits. There is no evidence of an acute infiltrate, pleural effusion or pneumothorax. A stable benign-appearing sclerotic focus is seen projecting over the third right rib. The visualized skeletal structures are unremarkable. IMPRESSION: No active cardiopulmonary disease. Electronically Signed   By: Joyce Gross.D.  On: 05/16/2022 21:04    Radiology DG Chest 2 View  Result Date: 05/16/2022 CLINICAL DATA:  COVID and vomiting. EXAM: CHEST - 2 VIEW COMPARISON:  September 02, 2013 FINDINGS: The heart size and mediastinal contours are within normal limits. There is no evidence of an acute infiltrate, pleural effusion or pneumothorax. A stable benign-appearing sclerotic focus is seen projecting over the third right rib. The visualized skeletal structures are unremarkable. IMPRESSION: No active  cardiopulmonary disease. Electronically Signed   By: Virgina Norfolk M.D.   On: 05/16/2022 21:04    Procedures Procedures    Medications Ordered in ED Medications  ondansetron (ZOFRAN-ODT) disintegrating tablet 4 mg (4 mg Oral Not Given 05/17/22 0103)  sodium chloride 0.9 % bolus 1,000 mL (1,000 mLs Intravenous New Bag/Given 05/17/22 0037)  ondansetron (ZOFRAN) injection 4 mg (4 mg Intravenous Given 05/17/22 0038)    ED Course/ Medical Decision Making/ A&P                             Medical Decision Making Patient s/p transplant with covid who was placed on Paxlovid and then started having nausea and emesis.    Problems Addressed: Elevated serum creatinine:    Details: Hydrated in the ED.  Will need recheck this week. Informed patient verbally and on discharge paperwork with nurse present patient verbalizes understanding  Nausea and vomiting, unspecified vomiting type:    Details: Stopped in the ED, RX for zofran.    Amount and/or Complexity of Data Reviewed External Data Reviewed: labs and notes.    Details: Previous notes and labs reviewed  Labs: ordered.    Details: All labs reviewed:  white count slight elevation 11.8, normal hemoglobin 12.1 normal platelets.  Normal potassium 4, sodium slight low 134, elevated creatinine 1.43.   Radiology: ordered and independent interpretation performed.    Details: Negative CXR by me   Risk Prescription drug management. Risk Details: Patient is well appearing with normal exam and vital signs.  Hydrated in the ED.  No emesis in the ED. Treated for UTI in the ED, will start keflex.   PO challenged successfully.  Has been informed she will need labwork this week by [facility].  She has been informed verbally and in writing.  She agrees to follow up and get this and reevaluation at [facility].  Stable for discharge.       Final Clinical Impression(s) / ED Diagnoses Final diagnoses:  COVID-19  Elevated serum creatinine  Nausea and vomiting,  unspecified vomiting type   Return for intractable cough, coughing up blood, fevers > 100.4 unrelieved by medication, shortness of breath, intractable vomiting, chest pain, shortness of breath, weakness, numbness, changes in speech, facial asymmetry, abdominal pain, passing out, Inability to tolerate liquids or food, cough, altered mental status or any concerns. No signs of systemic illness or infection. The patient is nontoxic-appearing on exam and vital signs are within normal limits.  I have reviewed the triage vital signs and the nursing notes. Pertinent labs & imaging results that were available during my care of the patient were reviewed by me and considered in my medical decision making (see chart for details). After history, exam, and medical workup I feel the patient has been appropriately medically screened and is safe for discharge home. Pertinent diagnoses were discussed with the patient. Patient was given return precautions.  Rx / DC Orders ED Discharge Orders          Ordered  ondansetron (ZOFRAN-ODT) 8 MG disintegrating tablet        05/17/22 0043                Heleena Miceli, MD 05/17/22 RX:[id number]

## 2023-01-17 ENCOUNTER — Other Ambulatory Visit: Payer: Self-pay | Admitting: Orthopedic Surgery

## 2023-01-17 ENCOUNTER — Encounter: Payer: Self-pay | Admitting: Orthopedic Surgery

## 2023-01-17 ENCOUNTER — Ambulatory Visit
Admission: RE | Admit: 2023-01-17 | Discharge: 2023-01-17 | Disposition: A | Discharge status: Home / Self Care | Payer: Managed Care, Other (non HMO) | Source: Ambulatory Visit | Attending: Orthopedic Surgery | Admitting: Orthopedic Surgery

## 2023-01-17 DIAGNOSIS — M25531 Pain in right wrist: Secondary | ICD-10-CM

## 2023-07-26 DIAGNOSIS — R634 Abnormal weight loss: Secondary | ICD-10-CM | POA: Diagnosis not present

## 2023-07-26 DIAGNOSIS — Z940 Kidney transplant status: Secondary | ICD-10-CM | POA: Diagnosis not present

## 2023-07-26 DIAGNOSIS — R5383 Other fatigue: Secondary | ICD-10-CM | POA: Diagnosis not present

## 2023-08-01 DIAGNOSIS — E7800 Pure hypercholesterolemia, unspecified: Secondary | ICD-10-CM | POA: Diagnosis not present

## 2023-08-01 DIAGNOSIS — D225 Melanocytic nevi of trunk: Secondary | ICD-10-CM | POA: Diagnosis not present

## 2023-08-01 DIAGNOSIS — L821 Other seborrheic keratosis: Secondary | ICD-10-CM | POA: Diagnosis not present

## 2023-08-01 DIAGNOSIS — Z940 Kidney transplant status: Secondary | ICD-10-CM | POA: Diagnosis not present

## 2023-08-01 DIAGNOSIS — D849 Immunodeficiency, unspecified: Secondary | ICD-10-CM | POA: Diagnosis not present

## 2023-08-01 DIAGNOSIS — L814 Other melanin hyperpigmentation: Secondary | ICD-10-CM | POA: Diagnosis not present

## 2023-08-01 DIAGNOSIS — L570 Actinic keratosis: Secondary | ICD-10-CM | POA: Diagnosis not present

## 2023-08-01 DIAGNOSIS — D485 Neoplasm of uncertain behavior of skin: Secondary | ICD-10-CM | POA: Diagnosis not present

## 2023-08-01 DIAGNOSIS — D2239 Melanocytic nevi of other parts of face: Secondary | ICD-10-CM | POA: Diagnosis not present

## 2023-08-14 DIAGNOSIS — Z5181 Encounter for therapeutic drug level monitoring: Secondary | ICD-10-CM | POA: Diagnosis not present

## 2023-08-14 DIAGNOSIS — Z9483 Pancreas transplant status: Secondary | ICD-10-CM | POA: Diagnosis not present

## 2023-08-14 DIAGNOSIS — Z940 Kidney transplant status: Secondary | ICD-10-CM | POA: Diagnosis not present

## 2023-08-15 DIAGNOSIS — C44529 Squamous cell carcinoma of skin of other part of trunk: Secondary | ICD-10-CM | POA: Diagnosis not present

## 2023-08-22 DIAGNOSIS — C44729 Squamous cell carcinoma of skin of left lower limb, including hip: Secondary | ICD-10-CM | POA: Diagnosis not present

## 2023-08-25 DIAGNOSIS — M810 Age-related osteoporosis without current pathological fracture: Secondary | ICD-10-CM | POA: Diagnosis not present

## 2023-08-25 DIAGNOSIS — D044 Carcinoma in situ of skin of scalp and neck: Secondary | ICD-10-CM | POA: Diagnosis not present

## 2023-08-25 DIAGNOSIS — E7800 Pure hypercholesterolemia, unspecified: Secondary | ICD-10-CM | POA: Diagnosis not present

## 2023-08-25 DIAGNOSIS — A6000 Herpesviral infection of urogenital system, unspecified: Secondary | ICD-10-CM | POA: Diagnosis not present

## 2023-08-29 DIAGNOSIS — C44319 Basal cell carcinoma of skin of other parts of face: Secondary | ICD-10-CM | POA: Diagnosis not present

## 2023-09-21 DIAGNOSIS — Z1231 Encounter for screening mammogram for malignant neoplasm of breast: Secondary | ICD-10-CM | POA: Diagnosis not present

## 2023-09-27 DIAGNOSIS — N6321 Unspecified lump in the left breast, upper outer quadrant: Secondary | ICD-10-CM | POA: Diagnosis not present

## 2023-09-27 DIAGNOSIS — R92342 Mammographic extreme density, left breast: Secondary | ICD-10-CM | POA: Diagnosis not present

## 2023-10-05 DIAGNOSIS — Z5181 Encounter for therapeutic drug level monitoring: Secondary | ICD-10-CM | POA: Diagnosis not present

## 2023-10-05 DIAGNOSIS — Z940 Kidney transplant status: Secondary | ICD-10-CM | POA: Diagnosis not present

## 2023-10-05 DIAGNOSIS — Z9483 Pancreas transplant status: Secondary | ICD-10-CM | POA: Diagnosis not present

## 2023-12-01 DIAGNOSIS — Z940 Kidney transplant status: Secondary | ICD-10-CM | POA: Diagnosis not present

## 2023-12-01 DIAGNOSIS — D849 Immunodeficiency, unspecified: Secondary | ICD-10-CM | POA: Diagnosis not present

## 2023-12-01 DIAGNOSIS — Z9483 Pancreas transplant status: Secondary | ICD-10-CM | POA: Diagnosis not present

## 2024-01-21 ENCOUNTER — Emergency Department (HOSPITAL_COMMUNITY): Payer: Self-pay

## 2024-01-21 ENCOUNTER — Encounter (HOSPITAL_COMMUNITY): Payer: Self-pay

## 2024-01-21 ENCOUNTER — Inpatient Hospital Stay (HOSPITAL_COMMUNITY)
Admission: EM | Admit: 2024-01-21 | Discharge: 2024-02-06 | DRG: 853 | Disposition: A | Payer: Self-pay | Attending: Family Medicine | Admitting: Family Medicine

## 2024-01-21 ENCOUNTER — Other Ambulatory Visit: Payer: Self-pay

## 2024-01-21 DIAGNOSIS — E440 Moderate protein-calorie malnutrition: Secondary | ICD-10-CM | POA: Diagnosis present

## 2024-01-21 DIAGNOSIS — E7800 Pure hypercholesterolemia, unspecified: Secondary | ICD-10-CM | POA: Diagnosis present

## 2024-01-21 DIAGNOSIS — D638 Anemia in other chronic diseases classified elsewhere: Secondary | ICD-10-CM | POA: Diagnosis present

## 2024-01-21 DIAGNOSIS — K429 Umbilical hernia without obstruction or gangrene: Secondary | ICD-10-CM | POA: Diagnosis present

## 2024-01-21 DIAGNOSIS — E872 Acidosis, unspecified: Secondary | ICD-10-CM | POA: Diagnosis present

## 2024-01-21 DIAGNOSIS — E10649 Type 1 diabetes mellitus with hypoglycemia without coma: Secondary | ICD-10-CM | POA: Diagnosis present

## 2024-01-21 DIAGNOSIS — F419 Anxiety disorder, unspecified: Secondary | ICD-10-CM | POA: Diagnosis present

## 2024-01-21 DIAGNOSIS — N1330 Unspecified hydronephrosis: Secondary | ICD-10-CM | POA: Diagnosis present

## 2024-01-21 DIAGNOSIS — J45909 Unspecified asthma, uncomplicated: Secondary | ICD-10-CM | POA: Diagnosis present

## 2024-01-21 DIAGNOSIS — Z91018 Allergy to other foods: Secondary | ICD-10-CM

## 2024-01-21 DIAGNOSIS — K562 Volvulus: Secondary | ICD-10-CM | POA: Diagnosis not present

## 2024-01-21 DIAGNOSIS — Z79899 Other long term (current) drug therapy: Secondary | ICD-10-CM

## 2024-01-21 DIAGNOSIS — E875 Hyperkalemia: Secondary | ICD-10-CM | POA: Diagnosis not present

## 2024-01-21 DIAGNOSIS — E2740 Unspecified adrenocortical insufficiency: Secondary | ICD-10-CM | POA: Diagnosis present

## 2024-01-21 DIAGNOSIS — Z811 Family history of alcohol abuse and dependence: Secondary | ICD-10-CM

## 2024-01-21 DIAGNOSIS — Z8261 Family history of arthritis: Secondary | ICD-10-CM

## 2024-01-21 DIAGNOSIS — Z794 Long term (current) use of insulin: Secondary | ICD-10-CM

## 2024-01-21 DIAGNOSIS — K567 Ileus, unspecified: Secondary | ICD-10-CM | POA: Diagnosis not present

## 2024-01-21 DIAGNOSIS — E109 Type 1 diabetes mellitus without complications: Secondary | ICD-10-CM

## 2024-01-21 DIAGNOSIS — A419 Sepsis, unspecified organism: Principal | ICD-10-CM | POA: Diagnosis present

## 2024-01-21 DIAGNOSIS — R54 Age-related physical debility: Secondary | ICD-10-CM | POA: Diagnosis present

## 2024-01-21 DIAGNOSIS — E871 Hypo-osmolality and hyponatremia: Secondary | ICD-10-CM | POA: Diagnosis present

## 2024-01-21 DIAGNOSIS — N170 Acute kidney failure with tubular necrosis: Secondary | ICD-10-CM | POA: Diagnosis present

## 2024-01-21 DIAGNOSIS — D6959 Other secondary thrombocytopenia: Secondary | ICD-10-CM | POA: Diagnosis present

## 2024-01-21 DIAGNOSIS — D84821 Immunodeficiency due to drugs: Secondary | ICD-10-CM | POA: Diagnosis present

## 2024-01-21 DIAGNOSIS — Z681 Body mass index (BMI) 19 or less, adult: Secondary | ICD-10-CM

## 2024-01-21 DIAGNOSIS — Z7983 Long term (current) use of bisphosphonates: Secondary | ICD-10-CM

## 2024-01-21 DIAGNOSIS — D62 Acute posthemorrhagic anemia: Secondary | ICD-10-CM | POA: Diagnosis not present

## 2024-01-21 DIAGNOSIS — R6521 Severe sepsis with septic shock: Principal | ICD-10-CM | POA: Diagnosis present

## 2024-01-21 DIAGNOSIS — N179 Acute kidney failure, unspecified: Secondary | ICD-10-CM

## 2024-01-21 DIAGNOSIS — Z8349 Family history of other endocrine, nutritional and metabolic diseases: Secondary | ICD-10-CM

## 2024-01-21 DIAGNOSIS — D7589 Other specified diseases of blood and blood-forming organs: Secondary | ICD-10-CM | POA: Diagnosis present

## 2024-01-21 DIAGNOSIS — F05 Delirium due to known physiological condition: Secondary | ICD-10-CM | POA: Diagnosis not present

## 2024-01-21 DIAGNOSIS — Z79621 Long term (current) use of calcineurin inhibitor: Secondary | ICD-10-CM

## 2024-01-21 DIAGNOSIS — Z8673 Personal history of transient ischemic attack (TIA), and cerebral infarction without residual deficits: Secondary | ICD-10-CM

## 2024-01-21 DIAGNOSIS — Z9889 Other specified postprocedural states: Secondary | ICD-10-CM

## 2024-01-21 DIAGNOSIS — K652 Spontaneous bacterial peritonitis: Secondary | ICD-10-CM | POA: Diagnosis present

## 2024-01-21 DIAGNOSIS — K55069 Acute infarction of intestine, part and extent unspecified: Secondary | ICD-10-CM | POA: Diagnosis present

## 2024-01-21 DIAGNOSIS — K559 Vascular disorder of intestine, unspecified: Secondary | ICD-10-CM | POA: Diagnosis present

## 2024-01-21 DIAGNOSIS — Z96653 Presence of artificial knee joint, bilateral: Secondary | ICD-10-CM | POA: Diagnosis present

## 2024-01-21 DIAGNOSIS — K650 Generalized (acute) peritonitis: Secondary | ICD-10-CM | POA: Diagnosis present

## 2024-01-21 DIAGNOSIS — Z9483 Pancreas transplant status: Secondary | ICD-10-CM

## 2024-01-21 DIAGNOSIS — F32A Depression, unspecified: Secondary | ICD-10-CM | POA: Diagnosis present

## 2024-01-21 DIAGNOSIS — Z79624 Long term (current) use of inhibitors of nucleotide synthesis: Secondary | ICD-10-CM

## 2024-01-21 DIAGNOSIS — T8619 Other complication of kidney transplant: Secondary | ICD-10-CM | POA: Diagnosis present

## 2024-01-21 DIAGNOSIS — R34 Anuria and oliguria: Secondary | ICD-10-CM | POA: Diagnosis present

## 2024-01-21 DIAGNOSIS — G9340 Encephalopathy, unspecified: Secondary | ICD-10-CM | POA: Diagnosis not present

## 2024-01-21 DIAGNOSIS — E876 Hypokalemia: Secondary | ICD-10-CM | POA: Diagnosis not present

## 2024-01-21 DIAGNOSIS — I10 Essential (primary) hypertension: Secondary | ICD-10-CM | POA: Diagnosis present

## 2024-01-21 DIAGNOSIS — Z888 Allergy status to other drugs, medicaments and biological substances status: Secondary | ICD-10-CM

## 2024-01-21 DIAGNOSIS — Z823 Family history of stroke: Secondary | ICD-10-CM

## 2024-01-21 DIAGNOSIS — Z8249 Family history of ischemic heart disease and other diseases of the circulatory system: Secondary | ICD-10-CM

## 2024-01-21 DIAGNOSIS — Z7982 Long term (current) use of aspirin: Secondary | ICD-10-CM

## 2024-01-21 DIAGNOSIS — Y830 Surgical operation with transplant of whole organ as the cause of abnormal reaction of the patient, or of later complication, without mention of misadventure at the time of the procedure: Secondary | ICD-10-CM | POA: Diagnosis present

## 2024-01-21 DIAGNOSIS — E1065 Type 1 diabetes mellitus with hyperglycemia: Secondary | ICD-10-CM | POA: Diagnosis present

## 2024-01-21 LAB — CBC WITH DIFFERENTIAL/PLATELET

## 2024-01-21 LAB — COMPREHENSIVE METABOLIC PANEL WITH GFR
ALT: 15 U/L (ref 0–44)
AST: 36 U/L (ref 15–41)
Albumin: 3.1 g/dL — ABNORMAL LOW (ref 3.5–5.0)
Alkaline Phosphatase: 44 U/L (ref 38–126)
Anion gap: 15 (ref 5–15)
BUN: 31 mg/dL — ABNORMAL HIGH (ref 8–23)
CO2: 20 mmol/L — ABNORMAL LOW (ref 22–32)
Calcium: 9.3 mg/dL (ref 8.9–10.3)
Chloride: 100 mmol/L (ref 98–111)
Creatinine, Ser: 2.2 mg/dL — ABNORMAL HIGH (ref 0.44–1.00)
GFR, Estimated: 25 mL/min — ABNORMAL LOW (ref >60)
Glucose, Bld: 122 mg/dL — ABNORMAL HIGH (ref 70–99)
Potassium: 3.8 mmol/L (ref 3.5–5.1)
Sodium: 135 mmol/L (ref 135–145)
Total Bilirubin: 0.8 mg/dL (ref 0.0–1.2)
Total Protein: 6.2 g/dL — ABNORMAL LOW (ref 6.5–8.1)

## 2024-01-21 LAB — I-STAT CG4 LACTIC ACID, ED: Lactic Acid, Venous: 4.1 mmol/L (ref 0.5–1.9)

## 2024-01-21 LAB — PROTIME-INR
INR: 1.3 — ABNORMAL HIGH (ref 0.8–1.2)
Prothrombin Time: 17.3 s — ABNORMAL HIGH (ref 11.4–15.2)

## 2024-01-21 LAB — I-STAT CHEM 8, ED
BUN: 48 mg/dL — ABNORMAL HIGH (ref 8–23)
Calcium, Ion: 1.11 mmol/L — ABNORMAL LOW (ref 1.15–1.40)
Chloride: 99 mmol/L (ref 98–111)
Creatinine, Ser: 2.4 mg/dL — ABNORMAL HIGH (ref 0.44–1.00)
Glucose, Bld: 119 mg/dL — ABNORMAL HIGH (ref 70–99)
HCT: 41 % (ref 36.0–46.0)
Hemoglobin: 13.9 g/dL (ref 12.0–15.0)
Potassium: 4.6 mmol/L (ref 3.5–5.1)
Sodium: 134 mmol/L — ABNORMAL LOW (ref 135–145)
TCO2: 26 mmol/L (ref 22–32)

## 2024-01-21 MED ORDER — METRONIDAZOLE 500 MG/100ML IV SOLN
500.0000 mg | Freq: Once | INTRAVENOUS | Status: AC
  Administered 2024-01-22: 500 mg via INTRAVENOUS
  Filled 2024-01-21: qty 100

## 2024-01-21 MED ORDER — LACTATED RINGERS IV BOLUS (SEPSIS)
1000.0000 mL | Freq: Once | INTRAVENOUS | Status: AC
  Administered 2024-01-22: 1000 mL via INTRAVENOUS

## 2024-01-21 MED ORDER — SODIUM CHLORIDE 0.9 % IV SOLN
2.0000 g | Freq: Once | INTRAVENOUS | Status: AC
  Administered 2024-01-21: 2 g via INTRAVENOUS
  Filled 2024-01-21: qty 12.5

## 2024-01-21 MED ORDER — LACTATED RINGERS IV BOLUS (SEPSIS)
500.0000 mL | Freq: Once | INTRAVENOUS | Status: AC
  Administered 2024-01-22: 500 mL via INTRAVENOUS

## 2024-01-21 MED ORDER — LACTATED RINGERS IV SOLN: INTRAVENOUS | Status: DC

## 2024-01-21 NOTE — ED Provider Notes (Signed)
 [place] EMERGENCY DEPARTMENT AT [facility] Provider Note   CSN: [id number] Arrival date & time: 01/21/24  2229     Patient presents with: Abdominal Pain   Sherri Todd is a 63 y.o. female.  {Add pertinent medical, surgical, social history, OB history to HPI:32947} HPI     Prior to Admission medications   Medication Sig Start Date End Date Taking? Authorizing Provider  acetaminophen -codeine  (TYLENOL  #3) 300-30 MG tablet Take 1 tablet by mouth every 4 (four) hours as needed for moderate pain. 11/18/20   Price, Michael J, DPM  acyclovir (ZOVIRAX) 200 MG/5ML suspension Take by mouth in the morning and at bedtime.    [provider]  albuterol (PROAIR HFA) 108 (90 Base) MCG/ACT inhaler 2 puffs as needed 02/13/15   [provider]  amoxicillin (AMOXIL) 500 MG capsule Take 1,000 mg by mouth 2 (two) times daily. 11/16/20   [provider]  Ascorbic Acid (VITAMIN C) 500 MG CAPS 1 capsule    [provider]  ascorbic acid (VITAMIN C) 500 MG tablet Take by mouth daily.    [provider]  aspirin 81 MG chewable tablet 1 tablet    [provider]  aspirin 81 MG EC tablet Take 81 mg by mouth daily.    [provider]  Boswellia-Glucosamine-Vit D (OSTEO BI-FLEX-GLUCOS/5-LOXIN) TABS Take by mouth.    [provider]  calcium carbonate (OSCAL) 1500 (600 Ca) MG TABS tablet     [provider]  calcium carbonate (TUMS EX) 750 MG chewable tablet Chew by mouth.    [provider]  cephALEXin  (KEFLEX ) 500 MG capsule Take 1 tablet by mouth tonight, then one tomorrow AM and one in PM 11/18/20   Price, Michael J, DPM  cephALEXin  (KEFLEX ) 500 MG capsule Take 1 capsule (500 mg total) by mouth 2 (two) times daily. 05/17/22   Palumbo, April, MD  Cetirizine HCl 10 MG CAPS Take 10 mg by mouth daily.    [provider]  Cholecalciferol (VITAMIN D3) 50 MCG (2000 UT) capsule 1 capsule    [provider]  Cholecalciferol 125 MCG (5000 UT) TABS Take 25 Units by mouth daily.     [provider]  diltiazem (CARDIZEM CD) 240 MG 24 hr capsule Take 240 mg by mouth daily. 09/24/19   [provider]  diltiazem (DILACOR XR) 240 MG 24 hr capsule 1 capsule on an empty stomach in the morning    [provider]  diltiazem (TIAZAC) 240 MG 24 hr capsule TAKE 1 CAPSULE(240 MG) BY MOUTH EVERY DAY 03/18/20   [provider]  FLUoxetine (PROZAC) 20 MG capsule Take 1 capsule by mouth daily. 09/26/12   [provider]  FLUoxetine (PROZAC) 20 MG capsule 1 capsule    [provider]  montelukast (SINGULAIR) 10 MG tablet Take 10 mg by mouth daily. 11/25/20   [provider]  montelukast (SINGULAIR) 10 MG tablet 1 tablet 11/25/20   [provider]  Multiple Vitamin (MULTIVITAMIN) capsule Take 1 capsule by mouth daily.    [provider]  mupirocin ointment (BACTROBAN) 2 % SMARTSIG:1 Application Topical 2-3 Times Daily 12/24/20   [provider]  mycophenolate (CELLCEPT) 250 MG capsule Take 750 mg by mouth 2 (two) times daily. 08/15/19   [provider]  mycophenolate (CELLCEPT) 250 MG capsule Take by mouth. 09/14/20   [provider]  mycophenolate (CELLCEPT) 250 MG capsule 3 tablets    [provider]  Omega-3 Fatty Acids (FP FISH OIL PO) Take 1 tablet by mouth daily. 12/04/09   [provider]  ondansetron  (ZOFRAN ) 4 MG tablet Take 1 tablet (4 mg total) by mouth every 8 (eight) hours as needed for nausea or vomiting. 11/18/20   Price, Michael J, DPM  ondansetron  (ZOFRAN -ODT) 8 MG disintegrating tablet 8mg  ODT q8hours prn nausea 05/17/22   Palumbo, April, MD  POLY-IRON 150 150 MG capsule Take 150 mg by mouth at bedtime. 09/22/19   [provider]  polyethylene glycol powder (GLYCOLAX/MIRALAX) 17 GM/SCOOP powder Take by mouth as needed.    [provider]  predniSONE  (DELTASONE) 5 MG tablet Take 5 mg by mouth daily. 08/13/19   [provider]  simvastatin (ZOCOR) 40 MG tablet Take 40 mg by mouth at bedtime. 08/07/19   [provider]  tacrolimus  (PROGRAF ) 0.5 MG capsule Take by mouth. 09/01/19   [provider]  tacrolimus  (PROGRAF ) 0.5 MG capsule Take by mouth. 08/26/20   [provider]  tacrolimus  (PROGRAF ) 1 MG capsule 1 tablet in the AM and 1 tablet in PM    [provider]  valACYclovir (VALTREX) 500 MG tablet TAKE 1 CAPLET BY MOUTH TWICE DAILY FOR 3 DAYS AS NEEDED FOR OUTBREAK 12/18/18   [provider]    Allergies: Ferric oxide, Gluten meal, Iron dextran, Iron sucrose, Soy allergy (obsolete), and Wheat    Review of Systems  Updated Vital Signs BP (!) 100/50 (BP Location: Right Arm)   Pulse (!) 102   Temp (!) 103.2 F (39.6 C) (Rectal)   Resp 20   Ht 1.549 m (5' 1)   Wt 47.6 kg   SpO2 100%   BMI 19.84 kg/m   Physical Exam  (all labs ordered are listed, but only abnormal results are displayed) Labs Reviewed  CBC WITH DIFFERENTIAL/PLATELET - Abnormal; Notable for the following components:      Result Value   WBC 16.3 (*)    RBC 3.68 (*)    MCV 107.9 (*)    MCH 36.1 (*)    All other components within normal limits  CULTURE, BLOOD (ROUTINE X 2)  CULTURE, BLOOD (ROUTINE X 2)  RESP PANEL BY RT-PCR (RSV, FLU A&B, COVID)  RVPGX2  COMPREHENSIVE METABOLIC PANEL WITH GFR  PROTIME-INR  URINALYSIS, W/ REFLEX TO CULTURE (INFECTION SUSPECTED)  I-STAT CG4 LACTIC ACID, ED  I-STAT CHEM 8, ED    EKG: None  Radiology: No results found.  {Document cardiac monitor, telemetry assessment procedure when appropriate:32947} Procedures   Medications Ordered in the ED  lactated ringers infusion (has no administration in time range)  lactated ringers bolus 1,000 mL (has no administration in time range)    And  lactated ringers bolus 500 mL (has no administration in time range)  ceFEPIme  (MAXIPIME) 2 g in sodium chloride  0.9 % 100 mL IVPB (has no administration in time range)  metroNIDAZOLE (FLAGYL) IVPB 500 mg (has no administration in time range)      {Click here for ABCD2, HEART and other calculators REFRESH Note before signing:1}                              Medical Decision Making Amount and/or Complexity of Data Reviewed Labs: ordered. Radiology: ordered.  Risk Prescription drug management.   ***  {Document critical care time when appropriate  Document review of labs and clinical decision tools ie CHADS2VASC2, etc  Document your independent  review of radiology images and any outside records  Document your discussion with family members, caretakers and with consultants  Document social determinants of health affecting pt's care  Document your decision making why or why not admission, treatments were needed:32947:::1}   Final diagnoses:  None    ED Discharge Orders     None

## 2024-01-21 NOTE — ED Triage Notes (Signed)
 Pt to ED via [facility] from home c/o Right side abdominal pain x 1 day. HX pancreas and kidney transplant. Pt has had constant N/V/D today.   #22 LAC, 250ml LR fluid bolus given, 4mg  ZOFRAN  given by EMS.   Initial bp on EMS arrival 70 systolic Last VS: 100/50 , HR 891 ,  97%RA. CBG 143  A&O X 4.

## 2024-01-21 NOTE — Progress Notes (Signed)
 Elink following code sepsis

## 2024-01-22 ENCOUNTER — Inpatient Hospital Stay (HOSPITAL_COMMUNITY)

## 2024-01-22 ENCOUNTER — Telehealth (HOSPITAL_COMMUNITY): Payer: Self-pay

## 2024-01-22 ENCOUNTER — Encounter (HOSPITAL_COMMUNITY)
Admission: EM | Disposition: A | Discharge status: Home / Self Care | Payer: Self-pay | Source: Home / Self Care | Attending: Internal Medicine

## 2024-01-22 ENCOUNTER — Encounter (HOSPITAL_COMMUNITY): Payer: Self-pay | Admitting: Anesthesiology

## 2024-01-22 ENCOUNTER — Emergency Department (HOSPITAL_COMMUNITY): Admitting: Anesthesiology

## 2024-01-22 ENCOUNTER — Other Ambulatory Visit (HOSPITAL_COMMUNITY): Payer: Self-pay

## 2024-01-22 ENCOUNTER — Emergency Department (HOSPITAL_COMMUNITY)

## 2024-01-22 DIAGNOSIS — E1069 Type 1 diabetes mellitus with other specified complication: Secondary | ICD-10-CM | POA: Diagnosis not present

## 2024-01-22 DIAGNOSIS — F32A Depression, unspecified: Secondary | ICD-10-CM | POA: Diagnosis present

## 2024-01-22 DIAGNOSIS — Y830 Surgical operation with transplant of whole organ as the cause of abnormal reaction of the patient, or of later complication, without mention of misadventure at the time of the procedure: Secondary | ICD-10-CM | POA: Diagnosis present

## 2024-01-22 DIAGNOSIS — I120 Hypertensive chronic kidney disease with stage 5 chronic kidney disease or end stage renal disease: Secondary | ICD-10-CM | POA: Diagnosis not present

## 2024-01-22 DIAGNOSIS — N170 Acute kidney failure with tubular necrosis: Secondary | ICD-10-CM | POA: Diagnosis present

## 2024-01-22 DIAGNOSIS — N179 Acute kidney failure, unspecified: Secondary | ICD-10-CM

## 2024-01-22 DIAGNOSIS — E039 Hypothyroidism, unspecified: Secondary | ICD-10-CM | POA: Diagnosis not present

## 2024-01-22 DIAGNOSIS — Z681 Body mass index (BMI) 19 or less, adult: Secondary | ICD-10-CM | POA: Diagnosis not present

## 2024-01-22 DIAGNOSIS — Z9889 Other specified postprocedural states: Secondary | ICD-10-CM | POA: Insufficient documentation

## 2024-01-22 DIAGNOSIS — K559 Vascular disorder of intestine, unspecified: Secondary | ICD-10-CM | POA: Diagnosis not present

## 2024-01-22 DIAGNOSIS — A419 Sepsis, unspecified organism: Secondary | ICD-10-CM | POA: Diagnosis present

## 2024-01-22 DIAGNOSIS — I10 Essential (primary) hypertension: Secondary | ICD-10-CM

## 2024-01-22 DIAGNOSIS — D62 Acute posthemorrhagic anemia: Secondary | ICD-10-CM | POA: Diagnosis not present

## 2024-01-22 DIAGNOSIS — K652 Spontaneous bacterial peritonitis: Secondary | ICD-10-CM | POA: Diagnosis present

## 2024-01-22 DIAGNOSIS — K562 Volvulus: Secondary | ICD-10-CM

## 2024-01-22 DIAGNOSIS — T8619 Other complication of kidney transplant: Secondary | ICD-10-CM | POA: Diagnosis present

## 2024-01-22 DIAGNOSIS — J45909 Unspecified asthma, uncomplicated: Secondary | ICD-10-CM

## 2024-01-22 DIAGNOSIS — Z992 Dependence on renal dialysis: Secondary | ICD-10-CM | POA: Diagnosis not present

## 2024-01-22 DIAGNOSIS — E871 Hypo-osmolality and hyponatremia: Secondary | ICD-10-CM | POA: Diagnosis present

## 2024-01-22 DIAGNOSIS — E440 Moderate protein-calorie malnutrition: Secondary | ICD-10-CM | POA: Diagnosis present

## 2024-01-22 DIAGNOSIS — E1065 Type 1 diabetes mellitus with hyperglycemia: Secondary | ICD-10-CM | POA: Diagnosis present

## 2024-01-22 DIAGNOSIS — G9340 Encephalopathy, unspecified: Secondary | ICD-10-CM | POA: Diagnosis not present

## 2024-01-22 DIAGNOSIS — E872 Acidosis, unspecified: Secondary | ICD-10-CM | POA: Diagnosis present

## 2024-01-22 DIAGNOSIS — Z9483 Pancreas transplant status: Secondary | ICD-10-CM | POA: Diagnosis not present

## 2024-01-22 DIAGNOSIS — R6521 Severe sepsis with septic shock: Secondary | ICD-10-CM | POA: Diagnosis present

## 2024-01-22 DIAGNOSIS — D696 Thrombocytopenia, unspecified: Secondary | ICD-10-CM | POA: Diagnosis not present

## 2024-01-22 DIAGNOSIS — K650 Generalized (acute) peritonitis: Secondary | ICD-10-CM | POA: Diagnosis present

## 2024-01-22 DIAGNOSIS — F05 Delirium due to known physiological condition: Secondary | ICD-10-CM | POA: Diagnosis not present

## 2024-01-22 DIAGNOSIS — K567 Ileus, unspecified: Secondary | ICD-10-CM | POA: Diagnosis not present

## 2024-01-22 DIAGNOSIS — D6959 Other secondary thrombocytopenia: Secondary | ICD-10-CM | POA: Diagnosis present

## 2024-01-22 DIAGNOSIS — N186 End stage renal disease: Secondary | ICD-10-CM | POA: Diagnosis not present

## 2024-01-22 DIAGNOSIS — D638 Anemia in other chronic diseases classified elsewhere: Secondary | ICD-10-CM | POA: Diagnosis present

## 2024-01-22 DIAGNOSIS — D84821 Immunodeficiency due to drugs: Secondary | ICD-10-CM | POA: Diagnosis present

## 2024-01-22 DIAGNOSIS — K55069 Acute infarction of intestine, part and extent unspecified: Secondary | ICD-10-CM | POA: Diagnosis present

## 2024-01-22 HISTORY — PX: BOWEL RESECTION: SHX1257

## 2024-01-22 HISTORY — PX: PARTIAL COLECTOMY: SHX5273

## 2024-01-22 HISTORY — PX: LAPAROTOMY: SHX154

## 2024-01-22 LAB — URINALYSIS, W/ REFLEX TO CULTURE (INFECTION SUSPECTED)
Glucose, UA: NEGATIVE mg/dL
Hgb urine dipstick: NEGATIVE
Ketones, ur: 15 mg/dL — AB
Nitrite: NEGATIVE
Protein, ur: >300 mg/dL — AB
Specific Gravity, Urine: >1.03 — ABNORMAL HIGH (ref 1.005–1.030)
pH: 6 (ref 5.0–8.0)

## 2024-01-22 LAB — COMPREHENSIVE METABOLIC PANEL WITH GFR
ALT: 14 U/L (ref 0–44)
AST: 36 U/L (ref 15–41)
Albumin: 2.2 g/dL — ABNORMAL LOW (ref 3.5–5.0)
Alkaline Phosphatase: 28 U/L — ABNORMAL LOW (ref 38–126)
Anion gap: 15 (ref 5–15)
BUN: 30 mg/dL — ABNORMAL HIGH (ref 8–23)
CO2: 21 mmol/L — ABNORMAL LOW (ref 22–32)
Calcium: 7.7 mg/dL — ABNORMAL LOW (ref 8.9–10.3)
Chloride: 98 mmol/L (ref 98–111)
Creatinine, Ser: 2.91 mg/dL — ABNORMAL HIGH (ref 0.44–1.00)
GFR, Estimated: 18 mL/min — ABNORMAL LOW (ref >60)
Glucose, Bld: 231 mg/dL — ABNORMAL HIGH (ref 70–99)
Potassium: 3.7 mmol/L (ref 3.5–5.1)
Sodium: 134 mmol/L — ABNORMAL LOW (ref 135–145)
Total Bilirubin: 1 mg/dL (ref 0.0–1.2)
Total Protein: 4.1 g/dL — ABNORMAL LOW (ref 6.5–8.1)

## 2024-01-22 LAB — CBC WITH DIFFERENTIAL/PLATELET
Basophils Relative: 0.1 % (ref 0.0–0.1)
Eosinophils Absolute: 1 % (ref 0.0–0.5)
Eosinophils Relative: 0 % (ref 0.0–0.5)
HCT: 39.7 % (ref 36.0–46.0)
Hemoglobin: 13.3 g/dL (ref 12.0–15.0)
Lymphocytes Relative: 4 %
Lymphs Abs: 0.6 % — AB (ref 0.7–4.0)
MCH: 36.1 pg — ABNORMAL HIGH (ref 26.0–34.0)
MCHC: 33.5 g/dL (ref 30.0–36.0)
MCV: 107.9 fL — ABNORMAL HIGH (ref 80.0–100.0)
Monocytes Absolute: 0 % (ref 0.1–1.0)
Monocytes Relative: 1.2 % — AB (ref 0.1–1.0)
Monocytes Relative: 7 % (ref 0.7–4.0)
Neutro Abs: 14.3 K/uL — AB (ref 1.7–7.7)
Neutrophils Relative %: 87 %
Other: 0.1 % — AB (ref 0.00–0.07)
Platelets: 214 K/uL (ref 150–400)
RBC: 3.68 MIL/uL — ABNORMAL LOW (ref 3.87–5.11)
RDW: 14.2 % (ref 11.5–15.5)
Smear Review: 1 %
Smear Review: NORMAL
WBC: 16.3 K/uL — ABNORMAL HIGH (ref 4.0–10.5)
nRBC: 0 % (ref 0.0–0.2)

## 2024-01-22 LAB — CREATININE, SERUM
Creatinine, Ser: 2.72 mg/dL — ABNORMAL HIGH (ref 0.44–1.00)
GFR, Estimated: 19 mL/min — ABNORMAL LOW (ref >60)

## 2024-01-22 LAB — POCT I-STAT 7, (LYTES, BLD GAS, ICA,H+H)
Acid-base deficit: 1 mmol/L (ref 0.0–2.0)
Acid-base deficit: 3 mmol/L — ABNORMAL HIGH (ref 0.0–2.0)
Bicarbonate: 21.9 mmol/L (ref 20.0–28.0)
Bicarbonate: 22.4 mmol/L (ref 20.0–28.0)
Calcium, Ion: 1.13 mmol/L — ABNORMAL LOW (ref 1.15–1.40)
Calcium, Ion: 1.1 mmol/L — ABNORMAL LOW (ref 1.15–1.40)
HCT: 24 % — ABNORMAL LOW (ref 36.0–46.0)
HCT: 22 % — ABNORMAL LOW (ref 36.0–46.0)
Hemoglobin: 8.2 g/dL — ABNORMAL LOW (ref 12.0–15.0)
Hemoglobin: 7.5 g/dL — ABNORMAL LOW (ref 12.0–15.0)
O2 Saturation: 99 %
O2 Saturation: 100 %
Patient temperature: 97.8
Potassium: 3.2 mmol/L — ABNORMAL LOW (ref 3.5–5.1)
Potassium: 3.6 mmol/L (ref 3.5–5.1)
Sodium: 135 mmol/L (ref 135–145)
Sodium: 132 mmol/L — ABNORMAL LOW (ref 135–145)
TCO2: 23 mmol/L (ref 22–32)
TCO2: 24 mmol/L (ref 22–32)
pCO2 arterial: 27.8 mmHg — ABNORMAL LOW (ref 32–48)
pCO2 arterial: 39.1 mmHg (ref 32–48)
pH, Arterial: 7.504 — ABNORMAL HIGH (ref 7.35–7.45)
pH, Arterial: 7.363 (ref 7.35–7.45)
pO2, Arterial: 144 mmHg — ABNORMAL HIGH (ref 83–108)
pO2, Arterial: 189 mmHg — ABNORMAL HIGH (ref 83–108)

## 2024-01-22 LAB — GLUCOSE, CAPILLARY
Glucose-Capillary: 92 mg/dL (ref 70–99)
Glucose-Capillary: 115 mg/dL — ABNORMAL HIGH (ref 70–99)
Glucose-Capillary: 191 mg/dL — ABNORMAL HIGH (ref 70–99)
Glucose-Capillary: 208 mg/dL — ABNORMAL HIGH (ref 70–99)
Glucose-Capillary: 184 mg/dL — ABNORMAL HIGH (ref 70–99)
Glucose-Capillary: 157 mg/dL — ABNORMAL HIGH (ref 70–99)

## 2024-01-22 LAB — CBC
HCT: 25.6 % — ABNORMAL LOW (ref 36.0–46.0)
Hemoglobin: 8.5 g/dL — ABNORMAL LOW (ref 12.0–15.0)
MCH: 35.6 pg — ABNORMAL HIGH (ref 26.0–34.0)
MCHC: 33.2 g/dL (ref 30.0–36.0)
MCV: 107.1 fL — ABNORMAL HIGH (ref 80.0–100.0)
Platelets: 162 K/uL (ref 150–400)
RBC: 2.39 MIL/uL — ABNORMAL LOW (ref 3.87–5.11)
RDW: 14.4 % (ref 11.5–15.5)
WBC: 9.3 K/uL (ref 4.0–10.5)
nRBC: 0 % (ref 0.0–0.2)

## 2024-01-22 LAB — PREPARE RBC (CROSSMATCH)

## 2024-01-22 LAB — SURGICAL PCR SCREEN
MRSA, PCR: NEGATIVE
Staphylococcus aureus: NEGATIVE

## 2024-01-22 LAB — HEMOGLOBIN A1C
Hgb A1c MFr Bld: 5 % (ref 4.8–5.6)
Mean Plasma Glucose: 96.8 mg/dL

## 2024-01-22 LAB — I-STAT CG4 LACTIC ACID, ED: Lactic Acid, Venous: 4.6 mmol/L (ref 0.5–1.9)

## 2024-01-22 LAB — MRSA NEXT GEN BY PCR, NASAL: MRSA by PCR Next Gen: NOT DETECTED

## 2024-01-22 LAB — ABO/RH: ABO/RH(D): O NEG

## 2024-01-22 SURGERY — LAPAROTOMY, EXPLORATORY
Anesthesia: General | Site: Abdomen

## 2024-01-22 MED ORDER — FENTANYL CITRATE (PF) 250 MCG/5ML IJ SOLN
INTRAMUSCULAR | Status: AC
  Filled 2024-01-22: qty 5

## 2024-01-22 MED ORDER — FENTANYL CITRATE (PF) 100 MCG/2ML IJ SOLN
INTRAMUSCULAR | Status: DC | PRN
  Administered 2024-01-22 (×2): 25 ug via INTRAVENOUS

## 2024-01-22 MED ORDER — ORAL CARE MOUTH RINSE
15.0000 mL | OROMUCOSAL | Status: DC | PRN
Start: 2024-01-22 — End: 2024-02-03

## 2024-01-22 MED ORDER — 0.9 % SODIUM CHLORIDE (POUR BTL) OPTIME
TOPICAL | Status: DC | PRN
  Administered 2024-01-22: 2000 mL

## 2024-01-22 MED ORDER — STERILE WATER FOR IRRIGATION IR SOLN
Status: DC | PRN
  Administered 2024-01-22: 1000 mL

## 2024-01-22 MED ORDER — FENTANYL 2500MCG IN NS 250ML (10MCG/ML) PREMIX INFUSION
INTRAVENOUS | Status: AC
  Administered 2024-01-22: 50 ug/h via INTRAVENOUS
  Filled 2024-01-22: qty 250

## 2024-01-22 MED ORDER — SODIUM CHLORIDE 0.9 % IV SOLN: INTRAVENOUS | Status: DC | PRN

## 2024-01-22 MED ORDER — LACTATED RINGERS IV BOLUS
1000.0000 mL | Freq: Once | INTRAVENOUS | Status: AC
  Administered 2024-01-22: 1000 mL via INTRAVENOUS

## 2024-01-22 MED ORDER — PHENYLEPHRINE HCL (PRESSORS) 10 MG/ML IV SOLN
INTRAVENOUS | Status: DC | PRN
  Administered 2024-01-22 (×2): 80 ug via INTRAVENOUS

## 2024-01-22 MED ORDER — ROCURONIUM BROMIDE 100 MG/10ML IV SOLN
INTRAVENOUS | Status: DC | PRN
  Administered 2024-01-22 (×2): 10 mg via INTRAVENOUS
  Administered 2024-01-22: 30 mg via INTRAVENOUS

## 2024-01-22 MED ORDER — DEXAMETHASONE SODIUM PHOSPHATE 4 MG/ML IJ SOLN
INTRAMUSCULAR | Status: DC | PRN
  Administered 2024-01-22: 5 mg via INTRAVENOUS

## 2024-01-22 MED ORDER — VASOPRESSIN 20 UNITS/100 ML INFUSION FOR SHOCK
0.0400 [IU]/min | INTRAVENOUS | Status: DC
  Administered 2024-01-23: 0.02 [IU]/min via INTRAVENOUS
  Filled 2024-01-22: qty 100

## 2024-01-22 MED ORDER — INSULIN STARTER KIT- PEN NEEDLES (ENGLISH): 1.0000 | Freq: Once | Status: DC

## 2024-01-22 MED ORDER — PHENYLEPHRINE HCL-NACL 20-0.9 MG/250ML-% IV SOLN
INTRAVENOUS | Status: DC | PRN
  Administered 2024-01-22: 40 ug/min via INTRAVENOUS

## 2024-01-22 MED ORDER — NOREPINEPHRINE 4 MG/250ML-% IV SOLN
0.0000 ug/min | INTRAVENOUS | Status: DC
Start: 2024-01-22 — End: 2024-01-22
  Administered 2024-01-22: 2 ug/min via INTRAVENOUS
  Filled 2024-01-22 (×2): qty 250

## 2024-01-22 MED ORDER — NOREPINEPHRINE 16 MG/250ML-% IV SOLN
0.0000 ug/min | INTRAVENOUS | Status: DC
  Administered 2024-01-22: 20 ug/min via INTRAVENOUS
  Administered 2024-01-23: 8 ug/min via INTRAVENOUS
  Filled 2024-01-22 (×2): qty 250

## 2024-01-22 MED ORDER — PHENYLEPHRINE HCL-NACL 20-0.9 MG/250ML-% IV SOLN: 0.0000 ug/min | INTRAVENOUS | Status: DC

## 2024-01-22 MED ORDER — CHLORHEXIDINE GLUCONATE CLOTH 2 % EX PADS
6.0000 | MEDICATED_PAD | Freq: Every day | CUTANEOUS | Status: DC
  Administered 2024-01-22 – 2024-02-06 (×16): 6 via TOPICAL

## 2024-01-22 MED ORDER — PROPOFOL 10 MG/ML IV BOLUS
INTRAVENOUS | Status: DC | PRN
Start: 2024-01-22 — End: 2024-01-22
  Administered 2024-01-22: 60 mg via INTRAVENOUS

## 2024-01-22 MED ORDER — INSULIN GLARGINE-YFGN 100 UNIT/ML ~~LOC~~ SOLN
5.0000 [IU] | Freq: Every day | SUBCUTANEOUS | Status: DC
  Administered 2024-01-22 – 2024-01-25 (×4): 5 [IU] via SUBCUTANEOUS
  Filled 2024-01-22 (×6): qty 0.05

## 2024-01-22 MED ORDER — VASOPRESSIN 20 UNIT/ML IV SOLN
INTRAVENOUS | Status: AC
  Filled 2024-01-22: qty 1

## 2024-01-22 MED ORDER — SUCCINYLCHOLINE CHLORIDE 20 MG/ML IJ SOLN
INTRAMUSCULAR | Status: DC | PRN
  Administered 2024-01-22: 100 mg via INTRAVENOUS

## 2024-01-22 MED ORDER — ALBUTEROL SULFATE (2.5 MG/3ML) 0.083% IN NEBU: 2.5000 mg | INHALATION_SOLUTION | RESPIRATORY_TRACT | Status: DC | PRN

## 2024-01-22 MED ORDER — LACTATED RINGERS IV SOLN: INTRAVENOUS | Status: DC | PRN

## 2024-01-22 MED ORDER — PROPOFOL 1000 MG/100ML IV EMUL
0.0000 ug/kg/min | INTRAVENOUS | Status: DC
  Administered 2024-01-22: 40 ug/kg/min via INTRAVENOUS
  Administered 2024-01-22 (×2): 50 ug/kg/min via INTRAVENOUS
  Administered 2024-01-23: 35 ug/kg/min via INTRAVENOUS
  Administered 2024-01-24: 20 ug/kg/min via INTRAVENOUS
  Filled 2024-01-22 (×5): qty 100

## 2024-01-22 MED ORDER — LIDOCAINE HCL (CARDIAC) PF 50 MG/5ML IV SOSY
PREFILLED_SYRINGE | INTRAVENOUS | Status: DC | PRN
  Administered 2024-01-22: 40 mg via INTRAVENOUS

## 2024-01-22 MED ORDER — ORAL CARE MOUTH RINSE
15.0000 mL | OROMUCOSAL | Status: DC
  Administered 2024-01-22 – 2024-01-24 (×29): 15 mL via OROMUCOSAL

## 2024-01-22 MED ORDER — PIPERACILLIN-TAZOBACTAM IN DEX 2-0.25 GM/50ML IV SOLN
2.2500 g | Freq: Four times a day (QID) | INTRAVENOUS | Status: DC
  Administered 2024-01-22 – 2024-01-24 (×8): 2.25 g via INTRAVENOUS
  Filled 2024-01-22 (×10): qty 50

## 2024-01-22 MED ORDER — TACROLIMUS 0.5 MG PO CAPS
0.5000 mg | ORAL_CAPSULE | Freq: Two times a day (BID) | ORAL | Status: DC
  Administered 2024-01-22 – 2024-01-30 (×18): 0.5 mg via SUBLINGUAL
  Filled 2024-01-22 (×20): qty 1

## 2024-01-22 MED ORDER — PANTOPRAZOLE SODIUM 40 MG IV SOLR
40.0000 mg | Freq: Every day | INTRAVENOUS | Status: DC
  Administered 2024-01-22 – 2024-02-01 (×11): 40 mg via INTRAVENOUS
  Filled 2024-01-22 (×11): qty 10

## 2024-01-22 MED ORDER — LACTATED RINGERS IV SOLN: INTRAVENOUS | Status: AC

## 2024-01-22 MED ORDER — PROPOFOL 10 MG/ML IV BOLUS
INTRAVENOUS | Status: AC
  Filled 2024-01-22: qty 20

## 2024-01-22 MED ORDER — HYDROCORTISONE SOD SUC (PF) 100 MG IJ SOLR
100.0000 mg | Freq: Three times a day (TID) | INTRAMUSCULAR | Status: DC
  Administered 2024-01-22 – 2024-01-25 (×10): 100 mg via INTRAVENOUS
  Filled 2024-01-22 (×10): qty 2

## 2024-01-22 MED ORDER — LACTATED RINGERS IV BOLUS: 1000.0000 mL | Freq: Once | INTRAVENOUS | Status: DC

## 2024-01-22 MED ORDER — ACETAMINOPHEN 650 MG RE SUPP
650.0000 mg | Freq: Once | RECTAL | Status: AC
  Administered 2024-01-22: 650 mg via RECTAL
  Filled 2024-01-22: qty 1

## 2024-01-22 MED ORDER — FENTANYL CITRATE (PF) 50 MCG/ML IJ SOSY
25.0000 ug | PREFILLED_SYRINGE | Freq: Once | INTRAMUSCULAR | Status: AC
  Administered 2024-01-22: 50 ug via INTRAVENOUS

## 2024-01-22 MED ORDER — INSULIN ASPART 100 UNIT/ML IJ SOLN
0.0000 [IU] | INTRAMUSCULAR | Status: DC
  Administered 2024-01-22: 2 [IU] via SUBCUTANEOUS
  Filled 2024-01-22: qty 2

## 2024-01-22 MED ORDER — MIDAZOLAM HCL 5 MG/5ML IJ SOLN
INTRAMUSCULAR | Status: DC | PRN
  Administered 2024-01-22 (×2): 1 mg via INTRAVENOUS

## 2024-01-22 MED ORDER — MIDAZOLAM HCL 2 MG/2ML IJ SOLN
INTRAMUSCULAR | Status: AC
  Filled 2024-01-22: qty 2

## 2024-01-22 MED ORDER — ONDANSETRON HCL 4 MG/2ML IJ SOLN
4.0000 mg | Freq: Once | INTRAMUSCULAR | Status: AC
  Administered 2024-01-22: 4 mg via INTRAVENOUS
  Filled 2024-01-22: qty 2

## 2024-01-22 MED ORDER — VASOPRESSIN 20 UNITS/100 ML INFUSION FOR SHOCK
0.0400 [IU]/min | INTRAVENOUS | Status: DC
  Administered 2024-01-22: 0.03 [IU]/min via INTRAVENOUS
  Administered 2024-01-22: 0.04 [IU]/min via INTRAVENOUS
  Filled 2024-01-22 (×2): qty 100

## 2024-01-22 MED ORDER — FENTANYL BOLUS VIA INFUSION
25.0000 ug | INTRAVENOUS | Status: DC | PRN
  Administered 2024-01-23: 25 ug via INTRAVENOUS
  Administered 2024-01-23: 50 ug via INTRAVENOUS

## 2024-01-22 MED ORDER — MUPIROCIN 2 % EX OINT
1.0000 | TOPICAL_OINTMENT | Freq: Two times a day (BID) | CUTANEOUS | Status: AC
  Administered 2024-01-22 – 2024-01-26 (×10): 1 via NASAL
  Filled 2024-01-22 (×6): qty 22

## 2024-01-22 MED ORDER — ALBUMIN HUMAN 5 % IV SOLN: INTRAVENOUS | Status: DC | PRN

## 2024-01-22 MED ORDER — INSULIN ASPART 100 UNIT/ML IJ SOLN
0.0000 [IU] | INTRAMUSCULAR | Status: DC
  Administered 2024-01-22: 4 [IU] via SUBCUTANEOUS
  Administered 2024-01-22: 7 [IU] via SUBCUTANEOUS
  Administered 2024-01-22 – 2024-01-23 (×2): 4 [IU] via SUBCUTANEOUS
  Administered 2024-01-23: 3 [IU] via SUBCUTANEOUS
  Administered 2024-01-24: 4 [IU] via SUBCUTANEOUS
  Administered 2024-01-24: 3 [IU] via SUBCUTANEOUS
  Filled 2024-01-22: qty 3
  Filled 2024-01-22: qty 4
  Filled 2024-01-22: qty 7
  Filled 2024-01-22: qty 4
  Filled 2024-01-22: qty 3
  Filled 2024-01-22 (×2): qty 4

## 2024-01-22 MED ORDER — PROPOFOL 500 MG/50ML IV EMUL
INTRAVENOUS | Status: DC | PRN
  Administered 2024-01-22: 50 ug/kg/min via INTRAVENOUS

## 2024-01-22 MED ORDER — FENTANYL 2500MCG IN NS 250ML (10MCG/ML) PREMIX INFUSION: 0.0000 ug/h | INTRAVENOUS | Status: DC

## 2024-01-22 MED ORDER — HEPARIN SODIUM (PORCINE) 5000 UNIT/ML IJ SOLN
5000.0000 [IU] | Freq: Three times a day (TID) | INTRAMUSCULAR | Status: DC
  Administered 2024-01-22 – 2024-02-01 (×28): 5000 [IU] via SUBCUTANEOUS
  Filled 2024-01-22 (×29): qty 1

## 2024-01-22 SURGICAL SUPPLY — 50 items
BAG COUNTER SPONGE SURGICOUNT (BAG) ×2 IMPLANT
BENZOIN TINCTURE PRP APPL 2/3 (GAUZE/BANDAGES/DRESSINGS) IMPLANT
BLADE CLIPPER SURG (BLADE) IMPLANT
CANISTER SUCTION 3000ML PPV (SUCTIONS) ×2 IMPLANT
CANISTER WOUND CARE 500ML ATS (WOUND CARE) IMPLANT
CHLORAPREP W/TINT 26 (MISCELLANEOUS) ×2 IMPLANT
COVER SURGICAL LIGHT HANDLE (MISCELLANEOUS) ×2 IMPLANT
DERMABOND ADVANCED .7 DNX12 (GAUZE/BANDAGES/DRESSINGS) ×4 IMPLANT
DRAPE INCISE IOBAN 66X45 STRL (DRAPES) ×2 IMPLANT
DRAPE LAPAROSCOPIC ABDOMINAL (DRAPES) ×2 IMPLANT
DRAPE WARM FLUID 44X44 (DRAPES) ×2 IMPLANT
DRSG OPSITE POSTOP 4X10 (GAUZE/BANDAGES/DRESSINGS) IMPLANT
DRSG OPSITE POSTOP 4X8 (GAUZE/BANDAGES/DRESSINGS) IMPLANT
ELECT BLADE 6.5 EXT (BLADE) IMPLANT
ELECT CAUTERY BLADE 6.4 (BLADE) ×2 IMPLANT
ELECTRODE REM PT RTRN 9FT ADLT (ELECTROSURGICAL) ×2 IMPLANT
GLOVE BIOGEL PI IND STRL 6 (GLOVE) ×2 IMPLANT
GLOVE BIOGEL PI MICRO STRL 5.5 (GLOVE) ×2 IMPLANT
GLOVE SURG ORTHO 8.0 STRL STRW (GLOVE) IMPLANT
GOWN STRL REUS W/ TWL LRG LVL3 (GOWN DISPOSABLE) ×4 IMPLANT
GOWN STRL REUS W/ TWL XL LVL3 (GOWN DISPOSABLE) IMPLANT
HANDLE SUCTION POOLE (INSTRUMENTS) ×2 IMPLANT
HEMOSTAT SNOW SURGICEL 2X4 (HEMOSTASIS) IMPLANT
KIT BASIN OR (CUSTOM PROCEDURE TRAY) ×2 IMPLANT
KIT TURNOVER KIT B (KITS) ×2 IMPLANT
LIGASURE IMPACT 36 18CM CVD LR (INSTRUMENTS) IMPLANT
PACK GENERAL/GYN (CUSTOM PROCEDURE TRAY) ×2 IMPLANT
PAD ARMBOARD POSITIONER FOAM (MISCELLANEOUS) ×2 IMPLANT
PENCIL SMOKE EVACUATOR (MISCELLANEOUS) ×2 IMPLANT
RELOAD STAPLE 75 3.8 BLU REG (ENDOMECHANICALS) IMPLANT
RETRACTOR WOUND ALXS 34CM XLRG (MISCELLANEOUS) IMPLANT
SLEEVE SUCTION CATH 165 (SLEEVE) ×2 IMPLANT
SOLN 0.9% NACL POUR BTL 1000ML (IV SOLUTION) ×4 IMPLANT
SPECIMEN JAR LARGE (MISCELLANEOUS) IMPLANT
SPONGE ABD ABTHERA ADVANCE (MISCELLANEOUS) IMPLANT
SPONGE T-LAP 18X18 ~~LOC~~+RFID (SPONGE) IMPLANT
STAPLER PROXIMATE 75MM BLUE (STAPLE) IMPLANT
STAPLER SKIN PROX 35W (STAPLE) IMPLANT
SUT MNCRL AB 4-0 PS2 18 (SUTURE) ×4 IMPLANT
SUT PDS AB 1 TP1 96 (SUTURE) ×4 IMPLANT
SUT PROLENE 4-0 RB1 .5 CRCL 36 (SUTURE) IMPLANT
SUT SILK 2 0 SH CR/8 (SUTURE) ×2 IMPLANT
SUT SILK 2 0 TIES 10X30 (SUTURE) ×2 IMPLANT
SUT SILK 3 0 SH CR/8 (SUTURE) ×2 IMPLANT
SUT SILK 3 0 TIES 10X30 (SUTURE) ×2 IMPLANT
SUT VIC AB 3-0 SH 18 (SUTURE) IMPLANT
SUT VIC AB 3-0 SH 27XBRD (SUTURE) ×4 IMPLANT
TOWEL GREEN STERILE (TOWEL DISPOSABLE) ×2 IMPLANT
TRAY FOLEY MTR SLVR 16FR STAT (SET/KITS/TRAYS/PACK) IMPLANT
YANKAUER SUCT BULB TIP NO VENT (SUCTIONS) IMPLANT

## 2024-01-22 NOTE — Op Note (Signed)
 Date: 01/22/24  Patient: Sherri Todd MRN: [id number]  Preoperative Diagnosis: Small bowel volvulus Postoperative Diagnosis: Internal hernia with small bowel volvulus and necrosis of the terminal ileum and ascending colon  Procedure:  Exploratory laparotomy Resection of distal ileum and ascending colon Explant of transplanted pancreas Temporary abdominal closure with negative pressure dressing  Surgeon: Leonor Dawn, MD Assistant: Krystal Spinner, MD  EBL: 500 mL  Anesthesia: General endotracheal  Specimens: Small bowel, ascending colon, transplanted pancreas  Indications: Sherri Todd is a 63 yo female who 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 Both grafts have been functioning well. She presented to the ED with two days of progressive abdominal pain, nausea and vomiting, and was septic at presentation. A CT scan showed a small bowel mesenteric swirl consistent with volvulus. After a discussion of the risks and benefits of surgery, she consented to proceed and was brought to the operating room for emergent laparotomy.  Findings: Internal hernia resulting in volvulus of the distal small bowel, with resulting ischemia and necrosis of the terminal ileum and ascending colon. This was also the location of the enteric anastomosis of the transplanted pancreas, which necessitated removal of the pancreas graft with the necrotic bowel. The patient was in septic shock on pressors at this point, so the bowel was left in discontinuity and the abdomen was left open.  Procedure details: Informed consent was obtained in the preoperative area prior to the procedure. The patient was brought to the operating room and placed on the table in the supine position. General anesthesia was induced and appropriate lines and drains were placed for intraoperative monitoring. Perioperative antibiotics were administered per SCIP guidelines. The abdomen was prepped and draped in the usual  sterile fashion. A pre-procedure timeout was taken verifying patient identity, surgical site and procedure to be performed.  A midline skin incision was made through the previous laparotomy scar, the subcutaneous tissue was divided with cautery, and the fascia was grasped and elevated.  The fascia was opened along the linea alba using cautery.  There was some omentum within an umbilical hernia, which was taken down using cautery.  There was a moderate amount of turbid fluid on entry into the abdomen.  The small bowel was diffusely dilated, with multiple loops of dusky bowel.  The small bowel was eviscerated, and there was a segment of bowel that was necrotic.  There appeared to be an internal hernia with torsion of the mesentery.  This internal hernia was reduced and the mesentery was untwisted.  There was a small bowel anastomosis in the ileum, with an associated mesenteric defect, which was where the internal hernia had occurred.  After reducing the internal hernia, much of the dusky bowel had improved perfusion and became normal in appearance.  However the terminal ileum and the ascending colon were frankly necrotic.  The ligament of Treitz was identified and the entire small bowel was run to the terminal ileum.  At the small bowel anastomosis, it appeared that there was one limb that went to the cecum, and an additional limb that went down towards the right lower quadrant and pelvis, presumably to the transplanted pancreas.  The cecum was necrotic, and was very adherent to the right pelvic sidewall, thus I suspected that the pancreas had been transplanted in the right iliac fossa. This had not been clearly determined on preoperative imaging. The transplanted kidney was visible in the retroperitoneum in the left iliac fossa. At this point I called my partner in  for assistance as I was concerned the transplanted pancreas would need to be removed if it was anastomosed to the ischemic bowel.  The hepatic flexure  of the colon was identified, and the colon was followed from this point down to the cecum.  The ascending colon was mobilized in a lateral to medial fashion along the line of Toldt using cautery.  The cecum was densely adherent to the right pelvic sidewall, and was taken down sharply. On mobilizing the necrotic cecum medially, sutures were noted on the posterior surface, as well as a long segment of firm fatty tissue that appeared to be the pancreatic graft. This was confirmed when we further separated these structures from the retroperitoneum and encountered the vascular anastomoses to the external iliac vessels. At this point we could identify the transplanted pancreas with what appeared to be the donor duodenum, anastomosed to the recipient colon. The pancreas itself appeared viable but all the associated bowel was frankly necrotic. My partner and I agreed there was no way at this point to save the pancreas transplant without leaving behind ischemic bowel, thus we proceeded with explanting the pancreas en bloc with the necrotic bowel. The vascular supply appeared diminutive and was ligated with a 2-0 silk tie. This completely mobilized the ileum and ascending colon as well as the pancreas graft. A mesenteric window was created in the small bowel just proximal to the small bowel anastomosis, and the small bowel was transected at this point using a 75mm GIA stapler with a blue load. A mesenteric window was created in the ascending colon just proximal to the hepatic flexure, and the colon was divided with a 75mm GIA stapler with a blue load. The intervening mesentery was divided with Ligasure. The specimen was passed off the field and sent for routine pathology. There was bleeding from the external iliac vein, which was repaired with 4-0 Prolene suture. A piece of Surgicel snow was placed over the repair. The external iliac artery had a strong pulse distal to where the vascular anastomosis had been.  At this point  the patient remained in shock on high-dose levophed. Thus the bowel was left in discontinuity. The abdomen was irrigated with sterile saline and appeared hemostatic. An Abthera dressing was placed.  Upon entering the abdomen (organ space), I encountered purulent ascites.  CASE DATA:  Type of patient?: DOW CASE (Surgical Hospitalist [facility])  Status of Case? EMERGENT Add On  Infection Present At Time Of Surgery (PATOS)?  Purulent peritonitis, bowel necrosis    All counts were correct x2 at the end of the procedure. The patient remained intubated and was transported to the ICU for further care following the procedure.  Leonor Dawn, MD 01/22/24 5:20 AM

## 2024-01-22 NOTE — ED Notes (Signed)
 Pt vomited x3. Pt cleaned up, changed sheets

## 2024-01-22 NOTE — ED Notes (Signed)
Report given to Amanda, CRNA. 

## 2024-01-22 NOTE — Progress Notes (Signed)
 Pt received from OR on 20mcg Levo, 45 mcg propofol, and 25mcg Neo. PCCM notified.

## 2024-01-22 NOTE — Consult Note (Signed)
 Nephrology Consult   Requesting provider: Lenny Drought Service requesting consult: CCM Reason for consult: AKI   Assessment/Recommendations: Sherri Todd is a/an 63 y.o. female with a past medical history HTN, CVA, DM 1, ESRD status post kidney and pancreas transplant in October at 1999 followed at [facility] who presents with septic shock secondary to ischemic bowel  Oliguric AKI: Baseline creatinine normal.  Likely secondary to shock with some degree of tubular injury and ATN.  Creatinine rising slowly.  Urine output fairly low.  Prognosis difficult to clear at this time.  No acute needs for dialysis -Continue with supportive care including pressors as needed -Do not think IV fluids are necessary at this time -Continue to monitor daily Cr, Dose meds for GFR -Monitor Daily I/Os, Daily weight  -Maintain MAP>65 for optimal renal perfusion.  -Avoid nephrotoxic medications including NSAIDs -Use synthetic opioids (Fentanyl/Dilaudid) if needed - Reassess for dialysis needs intermittently - Recommend continued Foley catheter  Severe septic shock: Associated with ischemic bowel.  Status postsurgery as below.  Continue pressors and antibiotics per primary team  Ischemic bowel: Associated with volvulus.  Also ischemic sized pancreas.  Status postsurgical resection.  Surgery following  History of kidney/pancreas transplant: In 1999.  Followed by [facility].  Hold home mycophenolate.  Continue tacrolimus  as sublingual.  Receiving stress dose steroids because of hypotension  Hyponatremia: Mild.  Associated with AKI.  Continue to monitor  Lactic acidosis: Associated with septic shock.  Continue with supportive care  Blood loss anemia: Associated with surgery.  Transfuse as needed   Recommendations conveyed to primary service.    [facility] 01/22/2024 10:48 AM   _____________________________________________________________________________________ CC:  Patient received nausea/vomiting, abdominal pain  History of Present Illness: Sherri Todd is a/an 63 y.o. 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  Patient was sedated so history was obtained per chart review.  The patient presented to the hospital yesterday with 2 days of abdominal pain, nausea, vomiting.  In the emergency department she was found to be hypotensive with concerns of sepsis.  Workup demonstrated small bowel obstruction with mesenteric volvulus.  She was taken to surgery urgently with resection of ischemic bowel as well as explantation of her transplanted pancreas due to concerns for necrosis of the vessels leading to the graft.  She was moved to the ICU after the procedure.  She was requiring norepinephrine, phenylephrine initially now on norepinephrine and vasopressin.  She has some lactic acidosis that has been ongoing.  She was placed on antibiotics.  Her baseline creatinine is 0.9.  Creatinine on arrival was 2.2 and most recent creatinine is 2.7.  Urine output has been fairly low.   Medications:  Current Facility-Administered Medications  Medication Dose Route Frequency Provider Last Rate Last Admin   albuterol (PROVENTIL) (2.5 MG/3ML) 0.083% nebulizer solution 2.5 mg  2.5 mg Nebulization Q4H PRN Paliwal, Aditya, MD       Chlorhexidine Gluconate Cloth 2 % PADS 6 each  6 each Topical Daily Paliwal, Aditya, MD       fentaNYL (SUBLIMAZE) bolus via infusion 25-100 mcg  25-100 mcg Intravenous Q15 min PRN Bowser, Grace E, NP       fentaNYL in NS (65mcg/ml) infusion-PREMIX  0-400 mcg/hr Intravenous Continuous Bowser, Ronnald BRAVO, NP 5 mL/hr at 01/22/24 1000 50 mcg/hr at 01/22/24 1000   heparin injection  5,000 Units  5,000 Units Subcutaneous Q8H Dasie Leonor CROME, MD       hydrocortisone sodium succinate (SOLU-CORTEF) 100 MG injection 100  mg  100 mg Intravenous Q8H Paliwal, Aditya, MD   100 mg at 01/22/24 0851   insulin aspart (novoLOG) injection 0-9 Units  0-9 Units Subcutaneous Q4H Dasie Leonor CROME, MD   2 Units at 01/22/24 0853   insulin glargine-yfgn (SEMGLEE) injection 5 Units  5 Units Subcutaneous QHS Paliwal, Aditya, MD       lactated ringers bolus 1,000 mL  1,000 mL Intravenous Once Horton, Charmaine FALCON, MD       lactated ringers infusion   Intravenous Continuous Dasie Leonor CROME, MD 75 mL/hr at 01/22/24 1000 Infusion Verify at 01/22/24 1000   mupirocin ointment (BACTROBAN) 2 % 1 Application  1 Application Nasal BID Dasie Leonor CROME, MD       norepinephrine (LEVOPHED) 16 mg in (0.064 mg/mL) premix infusion  0-40 mcg/min Intravenous Titrated Daren Ronnald BRAVO, NP 9.38 mL/hr at 01/22/24 1000 10 mcg/min at 01/22/24 1000   Oral care mouth rinse  15 mL Mouth Rinse Q2H Paliwal, Aditya, MD   15 mL at 01/22/24 0856   Oral care mouth rinse  15 mL Mouth Rinse PRN Paliwal, Aditya, MD       pantoprazole (PROTONIX) injection 40 mg  40 mg Intravenous Daily Paliwal, Aditya, MD       piperacillin-tazobactam (ZOSYN) IVPB 2.25 g  2.25 g Intravenous Q6H Dasie Leonor L, MD       propofol (DIPRIVAN) 1000 MG/100ML infusion  0-80 mcg/kg/min Intravenous Continuous Daren Ronnald BRAVO, NP 14.79 mL/hr at 01/22/24 1000 50 mcg/kg/min at 01/22/24 1000   tacrolimus  (PROGRAF ) capsule 0.5 mg  0.5 mg Sublingual BID Topacio Cella J, MD       vasopressin (PITRESSIN) 20 Units in 100 mL (0.2 unit/mL) infusion-*FOR SHOCK*  0.04 Units/min Intravenous Continuous Paliwal, Aditya, MD 12 mL/hr at 01/22/24 1000 0.04 Units/min at 01/22/24 1000     ALLERGIES Ferric oxide, Gluten meal, Iron dextran, Iron sucrose, Soy allergy (obsolete), and Wheat  MEDICAL HISTORY Past Medical History:  Diagnosis Date   Asthma    High cholesterol    Hypertension    Stroke [facility])      SOCIAL HISTORY Social History   Socioeconomic History   Marital status: Married    Spouse  name: Not on file   Number of children: Not on file   Years of education: Not on file   Highest education level: Not on file  Occupational History   Not on file  Tobacco Use   Smoking status: Never   Smokeless tobacco: Never  Substance and Sexual Activity   Alcohol use: Yes    Alcohol/week: 2.0 standard drinks of alcohol    Types: 2 Glasses of wine per week   Drug use: No   Sexual activity: Not on file  Other Topics Concern   Not on file  Social History Narrative   Not on file   Social Drivers of Health   Financial Resource Strain: Not on file  Food Insecurity: Not on file  Transportation Needs: Not on file  Physical Activity: Not on file  Stress: Not on file  Social Connections: Not on file  Intimate Partner Violence: Not on file     FAMILY HISTORY Family History  Problem Relation Age of Onset   Stroke Mother    Hypothyroidism Mother    Rheum arthritis Mother    Stroke Maternal Grandmother  Alcoholism Father    Hypertension Father    Suicidality Brother       Review of Systems: Unable to obtain due to the patient's sedation  Physical Exam: Vitals:   01/22/24 0826 01/22/24 0847  BP:    Pulse:    Resp:    Temp:  98.9 F (37.2 C)  SpO2: 100%    Total I/O In: 2265.8 [I.V.:1265.4; IV Piggyback:1000.4] Out: 400 [Emesis/NG output:400]  Intake/Output Summary (Last 24 hours) at 01/22/2024 1048 Last data filed at 01/22/2024 1000 Gross per 24 hour  Intake 4931.61 ml  Output 1925 ml  Net 3006.61 ml   General: Critically ill-appearing, no acute distress HEENT: anicteric sclera, oropharynx clear without lesions CV: Tachycardia, no rub, no lower extremity edema Lungs: Coarse bilateral breath sounds, bilateral chest rise, ventilated Abd: Decompressed, bandage in place Skin: no visible lesions or rashes Psych: Sedated, not interactive Musculoskeletal: no obvious deformities Neuro: Sedated, not interactive  Test Results Reviewed Lab Results  Component  Value Date   NA 132 (L) 01/22/2024   K 3.6 01/22/2024   CL 99 01/21/2024   CO2 20 (L) 01/21/2024   BUN 48 (H) 01/21/2024   CREATININE 2.72 (H) 01/22/2024   CALCIUM 9.3 01/21/2024   ALBUMIN 3.1 (L) 01/21/2024    CBC Recent Labs  Lab 01/21/24 2248 01/21/24 2323 01/22/24 0447 01/22/24 0908 01/22/24 0959  WBC 16.3*  --   --  9.3  --   NEUTROABS 14.3*  --   --   --   --   HGB 13.3   < > 8.2* 8.5* 7.5*  HCT 39.7   < > 24.0* 25.6* 22.0*  MCV 107.9*  --   --  107.1*  --   PLT 214  --   --  162  --    < > = values in this interval not displayed.    I have reviewed all relevant outside healthcare records related to the patient's current hospitalization

## 2024-01-22 NOTE — Transfer of Care (Signed)
 Immediate Anesthesia Transfer of Care Note  Patient: Sherri Todd  Procedure(s) Performed: LAPAROTOMY, EXPLORATORY (Abdomen) SMALL BOWEL RESECTION (Abdomen) COLECTOMY, PARTIAL (Abdomen) EXPLANTED OF TRANSPLANTED PANCREAS (Abdomen)  Patient Location: ICU  Anesthesia Type:General  Level of Consciousness: sedated, unresponsive, and Patient remains intubated per anesthesia plan  Airway & Oxygen Therapy: Patient remains intubated per anesthesia plan and Patient placed on Ventilator (see vital sign flow sheet for setting)  Post-op Assessment: Report given to RN and Post -op Vital signs reviewed and stable  Post vital signs: Reviewed and stable  Last Vitals:  Vitals Value Taken Time  BP 125/61 01/22/24 05:45  Temp 36.9 C 01/22/24 05:50  Pulse 102 01/22/24 06:03  Resp 19 01/22/24 06:03  SpO2 100 % 01/22/24 06:03  Vitals shown include unfiled device data.  Last Pain:  Vitals:   01/22/24 0550  TempSrc: Oral  PainSc:          Complications: No notable events documented.

## 2024-01-22 NOTE — ED Notes (Signed)
 Assisted pt on to bed pan for UA. Pt attempted x3 to urinated without success. Pt refused a cathter placement

## 2024-01-22 NOTE — Anesthesia Preprocedure Evaluation (Addendum)
 Anesthesia Evaluation  Patient identified by MRN, date of birth, ID band Patient awake    Reviewed: Allergy & Precautions, Patient's Chart, lab work & pertinent test results  Airway Mallampati: Unable to assess  TM Distance: >3 FB Neck ROM: Full    Dental  (+) Teeth Intact, Dental Advisory Given   Pulmonary asthma    breath sounds clear to auscultation       Cardiovascular hypertension, Pt. on medications + Valvular Problems/Murmurs  Rhythm:Regular Rate:Normal     Neuro/Psych  PSYCHIATRIC DISORDERS  Depression    CVA    GI/Hepatic Neg liver ROS,GERD  ,,  Endo/Other  diabetes Hyperthyroidism   Renal/GU CRFRenal disease     Musculoskeletal  (+) Arthritis ,    Abdominal   Peds  Hematology  (+) Blood dyscrasia, anemia   Anesthesia Other Findings NGT in place and to suction  Reproductive/Obstetrics                              Anesthesia Physical Anesthesia Plan  ASA: 3 and emergent  Anesthesia Plan: General   Post-op Pain Management: Ofirmev  IV (intra-op)*   Induction: Intravenous  PONV Risk Score and Plan: 4 or greater and Ondansetron , Dexamethasone, Midazolam and Scopolamine patch - Pre-op  Airway Management Planned: Oral ETT  Additional Equipment: Arterial line  Intra-op Plan:   Post-operative Plan: Possible Post-op intubation/ventilation  Informed Consent: I have reviewed the patients History and Physical, chart, labs and discussed the procedure including the risks, benefits and alternatives for the proposed anesthesia with the patient or authorized representative who has indicated his/her understanding and acceptance.       Plan Discussed with: CRNA  Anesthesia Plan Comments:          Anesthesia Quick Evaluation

## 2024-01-22 NOTE — Plan of Care (Signed)

## 2024-01-22 NOTE — H&P (Addendum)
 Sheva Mcdougle Kazmierczak 16-May-1960  [id number].     HPI:  Ms. Brach is a 63 yo female who presented to the ED with nausea/vomiting and abdominal pain. Her symptoms began two days ago and have been getting progressively worse. She is still having bowel function. In the ED she has been hypotensive and has received several liters of fluid, and is now on low dose levophed. Labs in the ED are significant for a WBC of 16 and AKI (creatinine 2.2 from baseline of 1.4). 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 She is now followed at [facility] and has good graft function. Her only other abdominal surgery is a C section. She does not take any blood thinners.  ROS: Review of Systems  Constitutional:  Positive for chills, fever and malaise/fatigue.  Respiratory:  Negative for shortness of breath.   Gastrointestinal:  Positive for abdominal pain, nausea and vomiting.    Family History  Problem Relation Age of Onset   Stroke Mother    Hypothyroidism Mother    Rheum arthritis Mother    Stroke Maternal Grandmother    Alcoholism Father    Hypertension Father    Suicidality Brother     Past Medical History:  Diagnosis Date   Asthma    High cholesterol    Hypertension    Stroke [facility])     Past Surgical History:  Procedure Laterality Date   ABDOMINAL HYSTERECTOMY     CESAREAN SECTION     COMBINED KIDNEY-PANCREAS TRANSPLANT     FOOT SURGERY     Toe repair   REPLACEMENT TOTAL KNEE Bilateral    Partial on Left and complete on right    Social History:  reports that she has never smoked. She has never used smokeless tobacco. She reports current alcohol use of about 2.0 standard drinks of alcohol per week. She reports that she does not use drugs.  Allergies:  Allergies  Allergen Reactions   Ferric Oxide Anaphylaxis    IV Ferric Oxide    Gluten Meal Swelling    Other reaction(s): Unknown    Iron Dextran Anaphylaxis   Iron Sucrose Anaphylaxis   Soy Allergy (Obsolete) Nausea Only   Wheat Other (See Comments)    Celiac disease Celiac disease     (Not in a hospital admission)    Physical Exam: Blood pressure (!) 102/53, pulse (!) 117, temperature 98.4 F (36.9 C), temperature source Oral, resp. rate (!) 26, height 5' 1 (1.549 m), weight 47.6 kg, SpO2 97%. General: appears ill Neurological: alert and oriented, no focal deficits HEENT: normocephalic, atraumatic, NG in place draining bilious fluid CV: mild tachycardia, regular Respiratory: normal work of breathing on room air Abdomen: distended but compressible, tender to palpation. Well-healed lower midline surgical scar. Extremities: warm and well-perfused, no deformities, moving all extremities spontaneously   Results for orders placed or performed during the hospital encounter of 01/21/24 (from the past 48 hours)  Comprehensive metabolic panel     Status: Abnormal   Collection Time: 01/21/24 10:48 PM  Result Value Ref Range   Sodium 135 135 - 145 mmol/L   Potassium 3.8 3.5 - 5.1 mmol/L   Chloride 100 98 - 111 mmol/L   CO2 20 (L) 22 - 32 mmol/L   Glucose, Bld 122 (H) 70 - 99 mg/dL    Comment: Glucose reference range applies only to samples taken after fasting for  at least 8 hours.   BUN 31 (H) 8 - 23 mg/dL   Creatinine, Ser 7.79 (H) 0.44 - 1.00 mg/dL   Calcium 9.3 8.9 - 89.6 mg/dL   Total Protein 6.2 (L) 6.5 - 8.1 g/dL   Albumin 3.1 (L) 3.5 - 5.0 g/dL   AST 36 15 - 41 U/L   ALT 15 0 - 44 U/L   Alkaline Phosphatase 44 38 - 126 U/L   Total Bilirubin 0.8 0.0 - 1.2 mg/dL   GFR, Estimated 25 (L) >60 mL/min    Comment: (NOTE) Calculated using the CKD-EPI Creatinine Equation (2021)    Anion gap 15 5 - 15    Comment: ELECTROLYTES REPEATED TO VERIFY Performed at [facility], [address] [place], [place] [address]   CBC with Differential     Status: Abnormal   Collection Time: 01/21/24 10:48 PM   Result Value Ref Range   WBC 16.3 (H) 4.0 - 10.5 K/uL   RBC 3.68 (L) 3.87 - 5.11 MIL/uL   Hemoglobin 13.3 12.0 - 15.0 g/dL   HCT 60.2 63.9 - 53.9 %   MCV 107.9 (H) 80.0 - 100.0 fL   MCH 36.1 (H) 26.0 - 34.0 pg   MCHC 33.5 30.0 - 36.0 g/dL   RDW 85.7 88.4 - 84.4 %   Platelets 214 150 - 400 K/uL   nRBC 0.0 0.0 - 0.2 %   Neutrophils Relative % 87 %   Neutro Abs 14.3 (H) 1.7 - 7.7 K/uL   Lymphocytes Relative 4 %   Lymphs Abs 0.6 (L) 0.7 - 4.0 K/uL   Monocytes Relative 7 %   Monocytes Absolute 1.2 (H) 0.1 - 1.0 K/uL   Eosinophils Relative 0 %   Eosinophils Absolute 0.0 0.0 - 0.5 K/uL   Basophils Relative 1 %   Basophils Absolute 0.1 0.0 - 0.1 K/uL   WBC Morphology See Note     Comment: Mild Left Shift (1-5% metas, occ myelo)   RBC Morphology MORPHOLOGY UNREMARKABLE    Smear Review Normal platelet morphology    Immature Granulocytes 1 %   Abs Immature Granulocytes 0.10 (H) 0.00 - 0.07 K/uL    Comment: Performed at [facility], [address] [place], [place] [address]  Protime-INR     Status: Abnormal   Collection Time: 01/21/24 10:48 PM  Result Value Ref Range   Prothrombin Time 17.3 (H) 11.4 - 15.2 seconds   INR 1.3 (H) 0.8 - 1.2    Comment: (NOTE) INR goal varies based on device and disease states. Performed at [facility], [address], [place], [place] [address]   ABO/Rh     Status: None   Collection Time: 01/21/24 10:48 PM  Result Value Ref Range   ABO/RH(D)      O NEG Performed at [facility], [address] [place], [place] [address]   I-stat chem 8, ED (not at [facility], [facility] or [facility])     Status: Abnormal   Collection Time: 01/21/24 11:23 PM  Result Value Ref Range   Sodium 134 (L) 135 - 145 mmol/L   Potassium 4.6 3.5 - 5.1 mmol/L   Chloride 99 98 - 111 mmol/L   BUN 48 (H) 8 - 23 mg/dL   Creatinine, Ser 7.59 (H) 0.44 - 1.00 mg/dL   Glucose, Bld 880 (H) 70 - 99 mg/dL    Comment: Glucose reference range applies only to samples taken after  fasting for at least 8  hours.   Calcium, Ion 1.11 (L) 1.15 - 1.40 mmol/L   TCO2 26 22 - 32 mmol/L   Hemoglobin 13.9 12.0 - 15.0 g/dL   HCT 58.9 63.9 - 53.9 %  I-Stat Lactic Acid, ED     Status: Abnormal   Collection Time: 01/21/24 11:26 PM  Result Value Ref Range   Lactic Acid, Venous 4.1 (HH) 0.5 - 1.9 mmol/L   Comment NOTIFIED PHYSICIAN   I-Stat Lactic Acid, ED     Status: Abnormal   Collection Time: 01/22/24  2:11 AM  Result Value Ref Range   Lactic Acid, Venous 4.6 (HH) 0.5 - 1.9 mmol/L   Comment NOTIFIED PHYSICIAN   Type and screen  [facility]     Status: None (Preliminary result)   Collection Time: 01/22/24  2:15 AM  Result Value Ref Range   ABO/RH(D) PENDING    Antibody Screen PENDING    Sample Expiration      01/25/2024,2359 Performed at [facility], [address], [place], [place] [address]      Assessment/Plan 63 yo female with a history of kidney-pancreas transplant presenting with vomiting and abdominal pain. I personally reviewed her labs, imaging and notes. CT shows evidence of SBO with a mesenteric swirl, consistent with volvulus. I recommended proceeding with emergent laparotomy to prevent bowel ischemia and necrosis. I reviewed the benefits and risks of the planned procedure with the patient and her husband, including the possibility of a small bowel resection and the possibility of an ostomy. She expressed understanding and consents to proceed with surgery. - NPO, NG tube placed - IV fluid resuscitation - Antibiotics given in ED - Proceed to OR, plan for ICU admission postop. CCM has been consulted, appreciate assistance.   Leonor Dawn, MD [facility] General, Hepatobiliary and Pancreatic Surgery 01/22/24 2:42 AM

## 2024-01-22 NOTE — Progress Notes (Addendum)
 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  Now status post exploratory laparotomy and resection of ischemic bowel as well as explantation of the transplanted pancreas with temporary closure.  Patient is mechanically ventilated, vital signs are normal.  Currently on norepinephrine infusion and phenylephrine infusion.  Results consistent with hyperventilation, drop of hemoglobin from 13.9-8.2.  Stable lactic acidosis.  Status post 2 units PRBC and broad-spectrum antibiotics  eICU Interventions  Given type I DM status post pancreatic explantation, had 5 units of long-acting insulin to maintain some basal dosing.  Transition from norepinephrine and phenylephrine to norepinephrine and vasopressin  Add stress dose steroids in the setting of chronic immunosuppressive therapy with relative adrenal insufficiency and multi pressor shock.  Holding remaining immunosuppressive therapy.  Continue Zosyn, consider adding micafungin and vanc given immunocompromised state  Complete ground team evaluation pending, anticipate patient will need CVC-notified  CXR pending  - post OR/ intubation  DVT prophylaxis with heparin GI prophylaxis add pantoprazole     Intervention Category Evaluation Type: New Patient Evaluation  Shelma Eiben 01/22/2024, 6:35 AM

## 2024-01-22 NOTE — ED Notes (Signed)
 Total NGT output 

## 2024-01-22 NOTE — ED Notes (Signed)
 Transfer pt to [employer] [facility] [employer] with Personnel Officer

## 2024-01-22 NOTE — ED Notes (Signed)
 Back from CT

## 2024-01-22 NOTE — Anesthesia Procedure Notes (Signed)
 Arterial Line Insertion Start/End11/17/2025 3:17 AM, 01/22/2024 3:19 AM Performed by: Tilford Franky BIRCH, MD  Patient location: Pre-op. Preanesthetic checklist: patient identified, IV checked, site marked, risks and benefits discussed, surgical consent, monitors and equipment checked, pre-op evaluation, timeout performed and anesthesia consent Lidocaine 1% used for infiltration Left, radial was placed Catheter size: 20 G Hand hygiene performed  and maximum sterile barriers used   Attempts: 1 Following insertion, dressing applied and Biopatch. Post procedure assessment: normal and unchanged  Patient tolerated the procedure well with no immediate complications.

## 2024-01-22 NOTE — Progress Notes (Signed)
 Initial Nutrition Assessment  DOCUMENTATION CODES:   Not applicable  INTERVENTION:   If expecting prolonged period of inability to utilize GI tract, recommend considering TPN initiation within 48 hours.   Likely need to consider Relizorb if/when TF initiated; concern for malabsorption and need for pancreatic enzymes given removal of transplanted pancreas  Recommend insulin gtt with initiation of any nutrition support (TPN vs EN)   Noted pt with documented allergies to gluten, soy.   NUTRITION DIAGNOSIS:   Inadequate oral intake related to acute illness as evidenced by NPO status.   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  PMH includes type 1 DM and ESRD s/p pancreas and kidney transplants in 1999, HTN, CVA. Pt is followed by [facility]  11/17 CT: SBO secondary to mesenteric volvulus; OR: Ex Lap, resection of distal ileum and ascending colon, explant of transplanted pancreas, open abdomen with wound VAC  Remains on vent post op, sedated with fentanyl and propofol Strict NPO, NG to LIS. 400 mL documented so far today Levophed at 10 Vasopressin 0.04  Bowel in discontinuity, wound vac to open abdomen. Noted plan to return to OR tomorrow 11/18 or 11/19  Minimal UOP, +AKI. Baseline Creatinine normal with hx of renal transplant. Nephrology consulted, no RRT at the moment  Per allergy documentation, pt with hx of allergies to gluten (?celiac disease) and soy.   Pt on Tacrolimus , Cellcept and Prednisone as outpatient  Admission wt 47.6 kg; no recent wt encounters. Wt up post-op, currently  49.3 kg  Labs: CBGs 115-208 (goal 140-180)-noted DM coordinator consulted  Meds: Ss novolog Solucortef  NUTRITION - FOCUSED PHYSICAL EXAM:  Assess on follow-up  Diet Order:   Diet Order             Diet NPO time specified  Diet effective now                   EDUCATION NEEDS:   Not appropriate for education at this time  Skin:  Skin Assessment: Skin Integrity Issues: Skin Integrity Issues:: Wound VAC Wound Vac: open abdomen  Last BM:  PTA  Height:   Ht Readings from Last 1 Encounters:  01/22/24 5' 1 (1.549 m)    Weight:   Wt Readings from Last 1 Encounters:  01/22/24 49.3 kg    BMI:  Body mass index is 20.54 kg/m.  Estimated Nutritional Needs:   Kcal:  1500-1700 kcals  Protein:  75-90 g  Fluid:  1.5L   Betsey Finger MS, RDN, LDN, CNSC Registered Dietitian 3 Clinical Nutrition RD Inpatient Contact Info in Amion

## 2024-01-22 NOTE — Anesthesia Postprocedure Evaluation (Signed)
 Anesthesia Post Note  Patient: Sherri Todd  Procedure(s) Performed: LAPAROTOMY, EXPLORATORY (Abdomen) SMALL BOWEL RESECTION (Abdomen) COLECTOMY, PARTIAL (Abdomen) EXPLANTED OF TRANSPLANTED PANCREAS (Abdomen)     Patient location during evaluation: SICU Anesthesia Type: General Level of consciousness: sedated Pain management: pain level controlled Vital Signs Assessment: post-procedure vital signs reviewed and stable Respiratory status: patient remains intubated per anesthesia plan Cardiovascular status: stable Postop Assessment: no apparent nausea or vomiting Anesthetic complications: no   No notable events documented.  Last Vitals:  Vitals:   01/22/24 0650 01/22/24 0700  BP:    Pulse: (!) 102 (!) 102  Resp: (!) 23 (!) 26  Temp:    SpO2: 100% 100%    Last Pain:  Vitals:   01/22/24 0550  TempSrc: Oral  PainSc:                  Raphaela Cannaday D Kinzi Frediani

## 2024-01-22 NOTE — Consult Note (Signed)
 NAME:  Sherri Todd, MRN:  [id number], DOB:  1960/06/13, LOS: 0 ADMISSION DATE:  01/21/2024, CONSULTATION DATE:  01/22/24 REFERRING MD:  Dasie, CHIEF COMPLAINT:  volvulus    History of Present Illness:  Sherri Todd is a 63 y.o. 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    Pertinent  Medical History   has a past medical history of Asthma, High cholesterol, Hypertension, and Stroke (HCC). Pancreas and kidney transplant  Type 1 DM  Significant Hospital Events: Including procedures, antibiotic start and stop dates in addition to other pertinent events   11/17 to OR for emergent laparotomy  Interim History / Subjective:  POD 0 ex lap +  resection of distal ileum and ascending colon, pancreas explant -- open and in discontinuity  EBL 500cc    Case d/w primary surgeon at bedside in ICU  Objective    Blood pressure 125/61, pulse (!) 102, temperature 98.4 F (36.9 C), temperature source Oral, resp. rate (!) 26, height 5' 1 (1.549 m), weight 49.3 kg, SpO2 100%.    Vent Mode: SIMV;PRVC;PSV FiO2 (%):  [50 %] 50 % Set Rate:  [16 bmp] 16 bmp Vt Set:  [380 mL] 380 mL PEEP:  [5 cmH20] 5 cmH20 Pressure Support:  [10 cmH20] 10 cmH20   Intake/Output Summary (Last 24 hours) at 01/22/2024 0826 Last data filed at 01/22/2024 9390 Gross per 24 hour  Intake 2665.8 ml  Output 1525 ml  Net 1140.8 ml   Filed Weights   01/21/24 2242 01/22/24 0550  Weight: 47.6 kg 49.3 kg    Examination: General: wdwn critically  ill appearing middle aged F intubated sedated  HENT: ETT secure anicteric sclera pink mm  Lungs: mechanically ventilated, symmetrical chest expansion but diminished L side sounds  Cardiovascular: tachycardic, regular.  cap refill < 3 sec  Abdomen: open w wound vac in place w serosang output. Soft.   Extremities: no acute joint deformity no cyanosis or clubbing  Neuro: lightly sedated, awakens and follows commands  GU: foley   Resolved problem list   Assessment and Plan   Post-op mechanical ventilation  Malpositioned ETT Reported hx asthma  P -pull back ETT 4 cm  -cont MV support but will change over to [facility], and get an ABG an hour after -RASS -3 w open abdomen -- adding fent.  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 DM coordinator consult placed   S/p Simultaneous Pancreatic and Renal Transplant  Chronic immunosuppression  -(tacro  1mg  q12, cellcept 750mg  q12, pred 5mg  daily)  AKI  P -holding tacro cellcept pred -- on stress dose solucortef acutely -- as well as home acyclovir  -cont foley -strict I/O -follow renal indices, UOP  -nephro cs   HTN HL P -holding home meds in setting of shock + nothing per tube  Labs   CBC: Recent Labs  Lab 01/21/24 2248 01/21/24 2323 01/22/24 0447  WBC 16.3*  --   --   NEUTROABS 14.3*  --   --   HGB 13.3 13.9 8.2*  HCT 39.7 41.0 24.0*  MCV 107.9*  --   --   PLT 214  --   --     Basic Metabolic Panel: Recent  Labs  Lab 01/21/24 2248 01/21/24 2323 01/22/24 0447 01/22/24 0630  NA 135 134* 135  --   K 3.8 4.6 3.2*  --   CL 100 99  --   --   CO2 20*  --   --   --   GLUCOSE 122* 119*  --   --   BUN 31* 48*  --   --   CREATININE 2.20* 2.40*  --  2.72*  CALCIUM 9.3  --   --   --    GFR: Estimated Creatinine Clearance: 16.4 mL/min (A) (by C-G formula based on SCr of 2.72 mg/dL (H)). Recent Labs  Lab 01/21/24 2248 01/21/24 2326 01/22/24 0211  WBC 16.3*  --   --   LATICACIDVEN  --  4.1* 4.6*    Liver Function Tests: Recent Labs  Lab 01/21/24 2248  AST 36  ALT 15  ALKPHOS 44  BILITOT 0.8  PROT 6.2*  ALBUMIN 3.1*   No results for input(s): LIPASE, AMYLASE in the last 168 hours. No results for input(s): AMMONIA in the last 168 hours.  ABG    Component Value Date/Time   PHART 7.504 (H) 01/22/2024 0447   PCO2ART 27.8 (L) 01/22/2024 0447   PO2ART 144 (H) 01/22/2024 0447   HCO3 21.9 01/22/2024 0447   TCO2 23 01/22/2024 0447   ACIDBASEDEF 1.0 01/22/2024 0447   O2SAT 99 01/22/2024 0447     Coagulation Profile: Recent Labs  Lab 01/21/24 2248  INR 1.3*    Cardiac Enzymes: No results for input(s): CKTOTAL, CKMB, CKMBINDEX, TROPONINI in the last 168 hours.  HbA1C: No results found for: HGBA1C  CBG: Recent Labs  Lab 01/22/24 0445 01/22/24 0552  GLUCAP 92 115*    Review of Systems:   Unable to obtain intubated sedated   Past Medical History:  She,  has a past medical history of Asthma, High cholesterol, Hypertension, and Stroke (HCC).   Surgical History:   Past Surgical History:  Procedure Laterality Date   ABDOMINAL HYSTERECTOMY     CESAREAN SECTION     COMBINED KIDNEY-PANCREAS TRANSPLANT     FOOT SURGERY     Toe repair   REPLACEMENT TOTAL KNEE Bilateral    Partial on Left and complete on right     Social History:   reports that she has never smoked. She has never used smokeless tobacco. She reports current alcohol use of about 2.0  standard drinks of alcohol per week. She reports that she does not use drugs.   Family History:  Her family history includes Alcoholism in her father; Hypertension in her father; Hypothyroidism in her mother; Rheum arthritis in her mother; Stroke in her maternal grandmother and mother; Suicidality in her brother.   Allergies Allergies  Allergen Reactions  Ferric Oxide Anaphylaxis    IV Ferric Oxide    Gluten Meal Swelling    Other reaction(s): Unknown   Iron Dextran Anaphylaxis   Iron Sucrose Anaphylaxis   Soy Allergy (Obsolete) Nausea Only   Wheat Other (See Comments)    Celiac disease Celiac disease      Home Medications  Prior to Admission medications   Medication Sig Start Date End Date Taking? Authorizing Provider  acetaminophen -codeine  (TYLENOL  #3) 300-30 MG tablet Take 1 tablet by mouth every 4 (four) hours as needed for moderate pain. 11/18/20   Price, Michael J, DPM  acyclovir (ZOVIRAX) 200 MG/5ML suspension Take by mouth in the morning and at bedtime.    [provider]  albuterol (PROAIR HFA) 108 (90 Base) MCG/ACT inhaler 2 puffs as needed 02/13/15   [provider]  amoxicillin (AMOXIL) 500 MG capsule Take 1,000 mg by mouth 2 (two) times daily. 11/16/20   [provider]  Ascorbic Acid (VITAMIN C) 500 MG CAPS 1 capsule    [provider]  ascorbic acid (VITAMIN C) 500 MG tablet Take by mouth daily.    [provider]  aspirin 81 MG chewable tablet 1 tablet    [provider]  aspirin 81 MG EC tablet Take 81 mg by mouth daily.    [provider]  Boswellia-Glucosamine-Vit D (OSTEO BI-FLEX-GLUCOS/5-LOXIN) TABS Take by mouth.    [provider]  calcium carbonate (OSCAL) 1500 (600 Ca) MG TABS tablet     [provider]  calcium carbonate (TUMS EX) 750 MG chewable tablet Chew by mouth.    [provider]  cephALEXin  (KEFLEX ) 500 MG capsule Take 1 tablet by mouth tonight, then one  tomorrow AM and one in PM 11/18/20   Price, Michael J, DPM  cephALEXin  (KEFLEX ) 500 MG capsule Take 1 capsule (500 mg total) by mouth 2 (two) times daily. 05/17/22   Palumbo, April, MD  Cetirizine HCl 10 MG CAPS Take 10 mg by mouth daily.    [provider]  Cholecalciferol (VITAMIN D3) 50 MCG (2000 UT) capsule 1 capsule    [provider]  Cholecalciferol 125 MCG (5000 UT) TABS Take 25 Units by mouth daily.     [provider]  diltiazem (CARDIZEM CD) 240 MG 24 hr capsule Take 240 mg by mouth daily. 09/24/19   [provider]  diltiazem (DILACOR XR) 240 MG 24 hr capsule 1 capsule on an empty stomach in the morning    [provider]  diltiazem (TIAZAC) 240 MG 24 hr capsule TAKE 1 CAPSULE(240 MG) BY MOUTH EVERY DAY 03/18/20   [provider]  FLUoxetine (PROZAC) 20 MG capsule Take 1 capsule by mouth daily. 09/26/12   [provider]  FLUoxetine (PROZAC) 20 MG capsule 1 capsule    [provider]  montelukast (SINGULAIR) 10 MG tablet Take 10 mg by mouth daily. 11/25/20   [provider]  montelukast (SINGULAIR) 10 MG tablet 1 tablet 11/25/20   [provider]  Multiple Vitamin (MULTIVITAMIN) capsule Take 1 capsule by mouth daily.    [provider]  mupirocin ointment (BACTROBAN) 2 % SMARTSIG:1 Application Topical 2-3 Times Daily 12/24/20   [provider]  mycophenolate (CELLCEPT) 250 MG capsule Take 750 mg by mouth 2 (two) times daily. 08/15/19   [provider]  mycophenolate (CELLCEPT) 250 MG capsule Take by mouth. 09/14/20   [provider]  mycophenolate (CELLCEPT) 250 MG capsule 3 tablets    [provider]  Omega-3 Fatty Acids (FP FISH OIL PO) Take 1 tablet by mouth daily. 12/04/09   [provider]  ondansetron  (ZOFRAN ) 4 MG tablet Take 1 tablet (4 mg total) by mouth every 8 (eight) hours as needed for nausea or vomiting. 11/18/20   Price, Michael J, DPM   ondansetron  (ZOFRAN -ODT) 8 MG disintegrating tablet 8mg  ODT q8hours prn nausea 05/17/22   Palumbo, April, MD  POLY-IRON 150 150 MG capsule Take 150 mg by mouth at bedtime. 09/22/19   [provider]  polyethylene glycol powder (GLYCOLAX/MIRALAX) 17 GM/SCOOP powder Take by mouth as needed.    [provider]  predniSONE (DELTASONE) 5 MG tablet Take 5 mg by mouth daily. 08/13/19   [provider]  simvastatin (ZOCOR) 40 MG tablet Take 40 mg by mouth at bedtime. 08/07/19   [provider]  tacrolimus  (PROGRAF ) 0.5 MG capsule Take by mouth. 09/01/19   [provider]  tacrolimus  (PROGRAF ) 0.5 MG capsule Take by mouth. 08/26/20   [provider]  tacrolimus  (PROGRAF ) 1 MG capsule 1 tablet in the AM and 1 tablet in PM    [provider]  valACYclovir (VALTREX) 500 MG tablet TAKE 1 CAPLET BY MOUTH TWICE DAILY FOR 3 DAYS AS NEEDED FOR OUTBREAK 12/18/18   [provider]     Critical care time: 60 min       CRITICAL CARE Performed by: Ronnald FORBES Gave   Total critical care time: 60 minutes  Critical care time was exclusive of separately billable procedures and treating other patients. Critical care was necessary to treat or prevent imminent or life-threatening deterioration.  Critical care was time spent personally by me on the following activities: development of treatment plan with patient and/or surrogate as well as nursing, discussions with consultants, evaluation of patient's response to treatment, examination of patient, obtaining history from patient or surrogate, ordering and performing treatments and interventions, ordering and review of laboratory studies, ordering and review of radiographic studies, pulse oximetry and re-evaluation of patient's condition.  Ronnald Gave MSN, AGACNP-BC [place] [facility]/Critical Care Medicine Amion for pager 01/22/2024, 8:26 AM

## 2024-01-22 NOTE — Anesthesia Procedure Notes (Signed)
 Procedure Name: Intubation Date/Time: 01/22/2024 3:26 AM  Performed by: Celia Alan HERO, CRNAPre-anesthesia Checklist: Patient identified, Emergency Drugs available, Suction available, Patient being monitored and Timeout performed Patient Re-evaluated:Patient Re-evaluated prior to induction Oxygen Delivery Method: Circle system utilized Preoxygenation: Pre-oxygenation with 100% oxygen Induction Type: IV induction and Rapid sequence Laryngoscope Size: Glidescope and 3 Grade View: Grade I Tube type: Oral Tube size: 7.0 mm Number of attempts: 1 Airway Equipment and Method: Stylet and Video-laryngoscopy Placement Confirmation: ETT inserted through vocal cords under direct vision, positive ETCO2 and breath sounds checked- equal and bilateral Secured at: 21 cm Tube secured with: Tape Dental Injury: Teeth and Oropharynx as per pre-operative assessment

## 2024-01-22 NOTE — Procedures (Signed)
 Central Venous Catheter Insertion Procedure Note  Sherri Todd  [id number]  06-13-1960  Date:01/22/24  Time:7:56 AM   Provider Performing:Brennyn Ortlieb E Jarika Robben   Procedure: Insertion of Non-tunneled Central Venous Catheter(36556) with US  guidance (23062)   Indication(s) Shock requiring vasoactive medication administration   Consent Unable to obtain consent due to emergent nature of procedure.  Anesthesia Topical only with 1% lidocaine   Timeout Verified patient identification, verified procedure, site/side was marked, verified correct patient position, special equipment/implants available, medications/allergies/relevant history reviewed, required imaging and test results available.  Sterile Technique Maximal sterile technique including full sterile barrier drape, hand hygiene, sterile gown, sterile gloves, mask, hair covering, sterile ultrasound probe cover (if used).  Procedure Description Area of catheter insertion was cleaned with chlorhexidine and draped in sterile fashion.  With real-time ultrasound guidance a central venous catheter was placed into the right femoral vein. Positioning of guidewire within right femoral vein verified with ultrasound x 2 views prior to dilation of vessel and placement of central venous catheter. Nonpulsatile blood flow and easy flushing noted in all ports.  The catheter was sutured in place and sterile dressing applied.  Complications/Tolerance None; patient tolerated the procedure well.   EBL Minimal  Specimen(s) None    Sherri Gave MSN, AGACNP-BC [facility] Pulmonary/Critical Care Medicine Amion for pager  01/22/2024, 7:58 AM

## 2024-01-22 NOTE — Telephone Encounter (Signed)
 Pharmacy Patient Advocate Encounter  Insurance verification completed.    The patient is insured through [employer]. Patient has [employer], may use a copay card, and/or apply for patient assistance if available.    Ran test claim for Generic Semglee 100unit Pen and the current 30 day co-pay is $82.99.  Ran test claim for Generic Novolog 100unit Pen and the current 30 day co-pay is $30.   This test claim was processed through [facility]- copay amounts may vary at other pharmacies due to pharmacy/plan contracts, or as the patient moves through the different stages of their insurance plan.

## 2024-01-22 NOTE — ED Notes (Signed)
 14 french NGT placement at 45cm

## 2024-01-22 NOTE — ED Notes (Signed)
 Pt going to imagining

## 2024-01-22 NOTE — Progress Notes (Signed)
 S: intubated and sedated, discussed pt at bedside with CCM NP. Off neo, on vaso.  O: sedated on the vent, NGT with bilious output, open abd VAC with SS drainage  A/P: Hx of kidney-pancreas transplant 1999, followed at [facility]  Internal hernia with small bowel volvulus and necrosis of terminal ileum and ascending colon POD0 ex-lap, resection of distal ileum and ascending colon, explant of transplanted pancreas, ABThera VAC placement Dr. Dasie  - will plan to take back to OR tomorrow pending clinical stability  - appreciate CCM and resuscitative efforts - continue IV abx - on SSI but may ultimately need insulin gtt initially  - continue NGT on LIWS  Burnard JONELLE Louder, [facility] Surgery 01/22/2024, 11:28 AM Please see Amion for pager number during day hours 7:00am-4:30pm

## 2024-01-22 NOTE — ED Notes (Signed)
 Labs + type and screen sent

## 2024-01-23 ENCOUNTER — Encounter (HOSPITAL_COMMUNITY)
Admission: EM | Disposition: A | Discharge status: Home / Self Care | Payer: Self-pay | Source: Home / Self Care | Attending: Internal Medicine

## 2024-01-23 ENCOUNTER — Inpatient Hospital Stay (HOSPITAL_COMMUNITY): Admitting: Anesthesiology

## 2024-01-23 ENCOUNTER — Encounter (HOSPITAL_COMMUNITY): Payer: Self-pay | Admitting: Surgery

## 2024-01-23 DIAGNOSIS — Z992 Dependence on renal dialysis: Secondary | ICD-10-CM | POA: Diagnosis not present

## 2024-01-23 DIAGNOSIS — I120 Hypertensive chronic kidney disease with stage 5 chronic kidney disease or end stage renal disease: Secondary | ICD-10-CM | POA: Diagnosis not present

## 2024-01-23 DIAGNOSIS — R6521 Severe sepsis with septic shock: Secondary | ICD-10-CM | POA: Diagnosis not present

## 2024-01-23 DIAGNOSIS — K562 Volvulus: Secondary | ICD-10-CM | POA: Diagnosis not present

## 2024-01-23 DIAGNOSIS — N186 End stage renal disease: Secondary | ICD-10-CM | POA: Diagnosis not present

## 2024-01-23 DIAGNOSIS — N179 Acute kidney failure, unspecified: Secondary | ICD-10-CM | POA: Diagnosis not present

## 2024-01-23 DIAGNOSIS — A419 Sepsis, unspecified organism: Secondary | ICD-10-CM | POA: Diagnosis not present

## 2024-01-23 DIAGNOSIS — K559 Vascular disorder of intestine, unspecified: Secondary | ICD-10-CM | POA: Diagnosis not present

## 2024-01-23 HISTORY — PX: LAPAROTOMY: SHX154

## 2024-01-23 LAB — POCT I-STAT 7, (LYTES, BLD GAS, ICA,H+H)
Acid-Base Excess: 2 mmol/L (ref 0.0–2.0)
Bicarbonate: 28 mmol/L (ref 20.0–28.0)
Calcium, Ion: 1.01 mmol/L — ABNORMAL LOW (ref 1.15–1.40)
HCT: 26 % — ABNORMAL LOW (ref 36.0–46.0)
Hemoglobin: 8.8 g/dL — ABNORMAL LOW (ref 12.0–15.0)
O2 Saturation: 99 %
Patient temperature: 98.2
Potassium: 4.5 mmol/L (ref 3.5–5.1)
Sodium: 135 mmol/L (ref 135–145)
TCO2: 30 mmol/L (ref 22–32)
pCO2 arterial: 49.3 mmHg — ABNORMAL HIGH (ref 32–48)
pH, Arterial: 7.362 (ref 7.35–7.45)
pO2, Arterial: 145 mmHg — ABNORMAL HIGH (ref 83–108)

## 2024-01-23 LAB — GLUCOSE, CAPILLARY
Glucose-Capillary: 114 mg/dL — ABNORMAL HIGH (ref 70–99)
Glucose-Capillary: 134 mg/dL — ABNORMAL HIGH (ref 70–99)
Glucose-Capillary: 160 mg/dL — ABNORMAL HIGH (ref 70–99)
Glucose-Capillary: 72 mg/dL (ref 70–99)
Glucose-Capillary: 90 mg/dL (ref 70–99)
Glucose-Capillary: 95 mg/dL (ref 70–99)

## 2024-01-23 LAB — CBC
HCT: 26.1 % — ABNORMAL LOW (ref 36.0–46.0)
Hemoglobin: 8.7 g/dL — ABNORMAL LOW (ref 12.0–15.0)
MCH: 35.5 pg — ABNORMAL HIGH (ref 26.0–34.0)
MCHC: 33.3 g/dL (ref 30.0–36.0)
MCV: 106.5 fL — ABNORMAL HIGH (ref 80.0–100.0)
Platelets: 166 K/uL (ref 150–400)
RBC: 2.45 MIL/uL — ABNORMAL LOW (ref 3.87–5.11)
RDW: 14.4 % (ref 11.5–15.5)
WBC: 23.6 K/uL — ABNORMAL HIGH (ref 4.0–10.5)
nRBC: 0 % (ref 0.0–0.2)

## 2024-01-23 LAB — BASIC METABOLIC PANEL WITH GFR
Anion gap: 12 (ref 5–15)
BUN: 32 mg/dL — ABNORMAL HIGH (ref 8–23)
CO2: 24 mmol/L (ref 22–32)
Calcium: 7.3 mg/dL — ABNORMAL LOW (ref 8.9–10.3)
Chloride: 99 mmol/L (ref 98–111)
Creatinine, Ser: 1.96 mg/dL — ABNORMAL HIGH (ref 0.44–1.00)
GFR, Estimated: 29 mL/min — ABNORMAL LOW (ref >60)
Glucose, Bld: 87 mg/dL (ref 70–99)
Potassium: 4.5 mmol/L (ref 3.5–5.1)
Sodium: 135 mmol/L (ref 135–145)

## 2024-01-23 LAB — MAGNESIUM: Magnesium: 1.4 mg/dL — ABNORMAL LOW (ref 1.7–2.4)

## 2024-01-23 LAB — TRIGLYCERIDES: Triglycerides: 77 mg/dL (ref <150)

## 2024-01-23 LAB — MISC LABCORP TEST (SEND OUT): Labcorp test code: 83935

## 2024-01-23 SURGERY — LAPAROTOMY, EXPLORATORY
Anesthesia: General

## 2024-01-23 MED ORDER — SODIUM CHLORIDE 0.9 % IV SOLN: INTRAVENOUS | Status: DC | PRN

## 2024-01-23 MED ORDER — 0.9 % SODIUM CHLORIDE (POUR BTL) OPTIME
TOPICAL | Status: DC | PRN
  Administered 2024-01-23: 1000 mL

## 2024-01-23 MED ORDER — CALCIUM GLUCONATE-NACL 2-0.675 GM/100ML-% IV SOLN
2.0000 g | Freq: Once | INTRAVENOUS | Status: AC
  Administered 2024-01-23: 2000 mg via INTRAVENOUS
  Filled 2024-01-23: qty 100

## 2024-01-23 MED ORDER — LACTATED RINGERS IV SOLN: INTRAVENOUS | Status: DC | PRN

## 2024-01-23 MED ORDER — ROCURONIUM BROMIDE 100 MG/10ML IV SOLN
INTRAVENOUS | Status: DC | PRN
  Administered 2024-01-23: 50 mg via INTRAVENOUS

## 2024-01-23 MED ORDER — FENTANYL CITRATE (PF) 100 MCG/2ML IJ SOLN
INTRAMUSCULAR | Status: AC
  Filled 2024-01-23: qty 2

## 2024-01-23 MED ORDER — MAGNESIUM SULFATE 50 % IJ SOLN
6.0000 g | Freq: Once | INTRAVENOUS | Status: AC
  Administered 2024-01-23: 6 g via INTRAVENOUS
  Filled 2024-01-23: qty 12

## 2024-01-23 MED ORDER — PHENYLEPHRINE HCL (PRESSORS) 10 MG/ML IV SOLN
INTRAVENOUS | Status: DC | PRN
  Administered 2024-01-23: 160 ug via INTRAVENOUS

## 2024-01-23 SURGICAL SUPPLY — 42 items
BAG COUNTER SPONGE SURGICOUNT (BAG) ×1 IMPLANT
BLADE CLIPPER SURG (BLADE) IMPLANT
BNDG GAUZE DERMACEA FLUFF 4 (GAUZE/BANDAGES/DRESSINGS) IMPLANT
CANISTER SUCTION 3000ML PPV (SUCTIONS) ×1 IMPLANT
CHLORAPREP W/TINT 26 (MISCELLANEOUS) ×1 IMPLANT
COVER SURGICAL LIGHT HANDLE (MISCELLANEOUS) ×1 IMPLANT
DRAPE LAPAROSCOPIC ABDOMINAL (DRAPES) ×1 IMPLANT
DRAPE WARM FLUID 44X44 (DRAPES) ×1 IMPLANT
DRSG OPSITE POSTOP 4X10 (GAUZE/BANDAGES/DRESSINGS) IMPLANT
DRSG OPSITE POSTOP 4X8 (GAUZE/BANDAGES/DRESSINGS) IMPLANT
ELECT BLADE 6.5 EXT (BLADE) IMPLANT
ELECT CAUTERY BLADE 6.4 (BLADE) ×1 IMPLANT
ELECTRODE REM PT RTRN 9FT ADLT (ELECTROSURGICAL) ×1 IMPLANT
GAUZE SPONGE 4X4 12PLY STRL (GAUZE/BANDAGES/DRESSINGS) IMPLANT
GLOVE BIO SURGEON STRL SZ 6 (GLOVE) ×1 IMPLANT
GLOVE INDICATOR 6.5 STRL GRN (GLOVE) ×1 IMPLANT
GOWN STRL REUS W/ TWL LRG LVL3 (GOWN DISPOSABLE) ×1 IMPLANT
GOWN STRL REUS W/ TWL XL LVL3 (GOWN DISPOSABLE) ×1 IMPLANT
HANDLE SUCTION POOLE (INSTRUMENTS) ×1 IMPLANT
KIT BASIN OR (CUSTOM PROCEDURE TRAY) ×1 IMPLANT
KIT TURNOVER KIT B (KITS) ×1 IMPLANT
LIGASURE IMPACT 36 18CM CVD LR (INSTRUMENTS) IMPLANT
PACK GENERAL/GYN (CUSTOM PROCEDURE TRAY) ×1 IMPLANT
PAD ARMBOARD POSITIONER FOAM (MISCELLANEOUS) ×1 IMPLANT
PENCIL SMOKE EVACUATOR (MISCELLANEOUS) ×1 IMPLANT
SOLN 0.9% NACL POUR BTL 1000ML (IV SOLUTION) ×2 IMPLANT
SPECIMEN JAR LARGE (MISCELLANEOUS) IMPLANT
SPONGE T-LAP 18X18 ~~LOC~~+RFID (SPONGE) IMPLANT
STAPLER GUN LINEAR PROX 60 (STAPLE) IMPLANT
STAPLER PROXIMATE 75MM BLUE (STAPLE) IMPLANT
STAPLER SKIN PROX 35W (STAPLE) ×1 IMPLANT
SUT PDS AB 1 TP1 96 (SUTURE) IMPLANT
SUT PDS II 0 TP-1 LOOPED 60 (SUTURE) ×2 IMPLANT
SUT VIC AB 2-0 SH 18 (SUTURE) ×1 IMPLANT
SUT VIC AB 3-0 SH 18 (SUTURE) ×1 IMPLANT
SUT VIC AB 4-0 PS2 18 (SUTURE) IMPLANT
SUT VICRYL AB 2 0 TIES (SUTURE) ×1 IMPLANT
SUT VICRYL AB 3 0 TIES (SUTURE) ×1 IMPLANT
TOWEL GREEN STERILE (TOWEL DISPOSABLE) ×1 IMPLANT
TOWEL GREEN STERILE FF (TOWEL DISPOSABLE) ×1 IMPLANT
TRAY FOLEY MTR SLVR 16FR STAT (SET/KITS/TRAYS/PACK) IMPLANT
YANKAUER SUCT BULB TIP NO VENT (SUCTIONS) IMPLANT

## 2024-01-23 NOTE — Anesthesia Postprocedure Evaluation (Signed)
 Anesthesia Post Note  Patient: Margrett Kalb Luppino  Procedure(s) Performed: LAPAROTOMY, EXPLORATORY     Patient location during evaluation: SICU Anesthesia Type: General Level of consciousness: sedated Pain management: pain level controlled Vital Signs Assessment: post-procedure vital signs reviewed and stable Respiratory status: patient remains intubated per anesthesia plan Cardiovascular status: stable Postop Assessment: no apparent nausea or vomiting Anesthetic complications: no   No notable events documented.  Last Vitals:  Vitals:   01/23/24 1500 01/23/24 1515  BP: 96/63 (!) 97/58  Pulse: 75   Resp: 16 16  Temp:    SpO2: 100%     Last Pain:  Vitals:   01/23/24 0700  TempSrc: Axillary  PainSc:                  Franky JONETTA Bald

## 2024-01-23 NOTE — Plan of Care (Signed)

## 2024-01-23 NOTE — Anesthesia Preprocedure Evaluation (Signed)
 Anesthesia Evaluation  Patient identified by MRN, date of birth, ID band Patient awake    Reviewed: Allergy & Precautions, Patient's Chart, lab work & pertinent test results  Airway Mallampati: Intubated  TM Distance: >3 FB Neck ROM: Full    Dental  (+) Teeth Intact, Dental Advisory Given   Pulmonary asthma     + decreased breath sounds      Cardiovascular hypertension, Pt. on medications + Valvular Problems/Murmurs  Rhythm:Regular Rate:Normal     Neuro/Psych  PSYCHIATRIC DISORDERS  Depression    CVA    GI/Hepatic Neg liver ROS,GERD  ,,  Endo/Other  diabetes Hyperthyroidism   Renal/GU CRFRenal disease     Musculoskeletal  (+) Arthritis ,    Abdominal   Peds  Hematology  (+) Blood dyscrasia, anemia   Anesthesia Other Findings Intubated, NGT in place and arterial line in place.   Reproductive/Obstetrics                              Anesthesia Physical Anesthesia Plan  ASA: 4  Anesthesia Plan: General   Post-op Pain Management: Ofirmev  IV (intra-op)*   Induction: Inhalational  PONV Risk Score and Plan: 4 or greater and Ondansetron , Dexamethasone, Midazolam and Scopolamine patch - Pre-op  Airway Management Planned:   Additional Equipment:   Intra-op Plan:   Post-operative Plan: Possible Post-op intubation/ventilation  Informed Consent: I have reviewed the patients History and Physical, chart, labs and discussed the procedure including the risks, benefits and alternatives for the proposed anesthesia with the patient or authorized representative who has indicated his/her understanding and acceptance.       Plan Discussed with: CRNA  Anesthesia Plan Comments:          Anesthesia Quick Evaluation

## 2024-01-23 NOTE — Transfer of Care (Signed)
 Immediate Anesthesia Transfer of Care Note  Patient: Sherri Todd  Procedure(s) Performed: LAPAROTOMY, EXPLORATORY  Patient Location: ICU  Anesthesia Type:General  Level of Consciousness: sedated and Patient remains intubated per anesthesia plan  Airway & Oxygen Therapy: Patient remains intubated per anesthesia plan and Patient placed on Ventilator (see vital sign flow sheet for setting)  Post-op Assessment: Report given to RN and Post -op Vital signs reviewed and stable  Post vital signs: Reviewed and stable  Last Vitals:  Vitals Value Taken Time  BP 122/62 01/23/24 14:04  Temp    Pulse 75 01/23/24 14:04  Resp 16 01/23/24 14:03  SpO2 100 % 01/23/24 14:04  Vitals shown include unfiled device data.  Last Pain:  Vitals:   01/23/24 0700  TempSrc: Axillary  PainSc:          Complications: No notable events documented.

## 2024-01-23 NOTE — Progress Notes (Signed)
 NAME:  Sherri Todd, MRN:  [id number], DOB:  [birth year], LOS: 1 ADMISSION DATE:  01/21/2024, CONSULTATION DATE:  01/23/24 REFERRING MD:  Dasie, CHIEF COMPLAINT:  volvulus    History of Present Illness:  Sherri Todd is a 63 y.o. 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   Pertinent  Medical History   has a past medical history of Asthma, High cholesterol, Hypertension, and Stroke (HCC). Pancreas and kidney transplant  Type 1 DM  Significant Hospital Events: Including procedures, antibiotic start and stop dates in addition to other pertinent events   11/17 to OR for emergent laparotomy 11/18 return OR   Interim History / Subjective:   NAEO UOP has incr, Cr down to 1.96  On low dose NE, on 0.02 vaso   While sedated this morning, had dense LUE weakness    Objective    Blood pressure (!) 141/74, pulse 94, temperature (!) 97.5 F (36.4 C), temperature source Axillary, resp. rate 17, height 5' 1 (1.549 m), weight 51.6 kg, SpO2 100%.    Vent Mode: PRVC FiO2 (%):  [40 %] 40 % Set Rate:  [16 bmp] 16 bmp Vt Set:  [380 mL] 380 mL PEEP:  [5 cmH20] 5 cmH20 Plateau Pressure:  [12 cmH20-15 cmH20] 12 cmH20   Intake/Output Summary (Last 24 hours) at 01/23/2024 1028 Last data filed at 01/23/2024 0900 Gross per 24 hour  Intake 2620.52 ml  Output 1925 ml  Net 695.52 ml   Filed Weights   01/21/24 2242 01/22/24 0550 01/23/24 0600  Weight: 47.6 kg 49.3 kg 51.6 kg   Examination: General: critically ill  appearing adult F intubated  Neuro: Off of sedation, PERRL, no facial droop though obscured in part by ETT/securement device. 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 MAP goal 65 -solucortef -cont basal + SSI insulin -- will need to be adjusted PRN moving forward -RDN consulted  S/p pancreas kidney txp Chronic immunosuppression AKI  P -nephro following, appreciate recs  -holding cellcept pred. SL tacro. Solucortef  -cont foley, follow renal indices and uop    LUE weakness -concerns relayed from nursing re dense LUE weakness while sedated.  Start of this sx is not clear. I shut off sedation, removed foam block stabilizing her arterial line and pt had subsequent improvements-- following commands and can lift antigravity though not overhead. No neuro deficits otherwise.  P -has been d/w MD defer CT H   Hx HTN HLD P -holding home meds-- on pressors + nothing per tube   Labs   CBC: Recent Labs  Lab 01/21/24 2248 01/21/24 2323 01/22/24 0447 01/22/24 0908 01/22/24 0959 01/23/24 0417  WBC 16.3*  --   --  9.3  --  23.6*   NEUTROABS 14.3*  --   --   --   --   --   HGB 13.3 13.9 8.2* 8.5* 7.5* 8.7*  8.8*  HCT 39.7 41.0 24.0* 25.6* 22.0* 26.1*  26.0*  MCV 107.9*  --   --  107.1*  --  106.5*  PLT 214  --   --  162  --  166    Basic Metabolic Panel: Recent Labs  Lab 01/21/24 2248 01/21/24 2323 01/22/24 0447 01/22/24 0630 01/22/24 0908 01/22/24 0959 01/23/24 0417  NA 135 134* 135  --  134* 132* 135  135  K 3.8 4.6 3.2*  --  3.7 3.6 4.5  4.5  CL 100 99  --   --  98  --  99  CO2 20*  --   --   --  21*  --  24  GLUCOSE 122* 119*  --   --  231*  --  87  BUN 31* 48*  --   --  30*  --  32*  CREATININE 2.20* 2.40*  --  2.72* 2.91*  --  1.96*  CALCIUM 9.3  --   --   --  7.7*  --  7.3*  MG  --   --   --   --   --   --  1.4*   GFR: Estimated Creatinine Clearance: 22.7 mL/min (A) (by C-G formula based on SCr of 1.96 mg/dL (H)). Recent Labs  Lab 01/21/24 2248 01/21/24 2326 01/22/24 0211 01/22/24 0908 01/23/24 0417  WBC 16.3*  --   --  9.3 23.6*  LATICACIDVEN  --  4.1* 4.6*  --   --     Liver Function Tests: Recent Labs  Lab 01/21/24 2248 01/22/24 0908  AST 36 36  ALT 15 14  ALKPHOS 44 28*  BILITOT 0.8 1.0  PROT 6.2* 4.1*  ALBUMIN 3.1* 2.2*   No results for input(s): LIPASE, AMYLASE in the last 168 hours. No results for input(s): AMMONIA in the last 168 hours.  ABG    Component Value Date/Time   PHART 7.362 01/23/2024 0417   PCO2ART 49.3 (H) 01/23/2024 0417   PO2ART 145 (H) 01/23/2024 0417   HCO3 28.0 01/23/2024 0417   TCO2 30 01/23/2024 0417   ACIDBASEDEF 3.0 (H) 01/22/2024 0959   O2SAT 99 01/23/2024 0417     Coagulation Profile: Recent Labs  Lab 01/21/24 2248  INR 1.3*    Cardiac Enzymes: No results for input(s): CKTOTAL, CKMB, CKMBINDEX, TROPONINI in the last 168 hours.  HbA1C: Hgb A1c MFr Bld  Date/Time Value Ref Range Status  01/22/2024 09:08 AM 5.0 4.8 - 5.6 % Final    Comment:    (NOTE) Diagnosis of Diabetes The following HbA1c ranges  recommended by the American Diabetes Association (ADA) may be used as an aid in the diagnosis of diabetes mellitus.  Hemoglobin  Suggested A1C NGSP%              Diagnosis  <5.7                   Non Diabetic  5.7-6.4                Pre-Diabetic  >6.4                   Diabetic  <7.0                   Glycemic control for                       adults with diabetes.      CBG: Recent Labs  Lab 01/22/24 1555 01/22/24 1935 01/23/24 0018 01/23/24 0418 01/23/24 0731  GLUCAP 184* 157* 72 90 134*    CRITICAL CARE Performed by: Ronnald FORBES Gave   Total critical care time: 48 minutes  Critical care time was exclusive of separately billable procedures and treating other patients. Critical care was necessary to treat or prevent imminent or life-threatening deterioration.  Critical care was time spent personally by me on the following activities: development of treatment plan with patient and/or surrogate as well as nursing, discussions with consultants, evaluation of patient's response to treatment, examination of patient, obtaining history from patient or surrogate, ordering and performing treatments and interventions, ordering and review of laboratory studies, ordering and review of radiographic studies, pulse oximetry and re-evaluation of patient's condition.  Ronnald Gave MSN, AGACNP-BC [facility] Amion for pager  01/23/2024, 10:28 AM

## 2024-01-23 NOTE — Op Note (Signed)
 PRE-OPERATIVE DIAGNOSIS: s/p ex lap with bowel ischemia from internal hernia, discontinuous small bowel.    POST-OPERATIVE DIAGNOSIS:  Same plus umbilical hernia  PROCEDURE:  Procedure(s): Reopening of prior laparotomy, ileocolonic anastamosis at proximal transverse colon, suture repair upon closure of umbilical hernia  SURGEON:  Surgeon(s): Jina Nephew, MD  ASSIST:   Burnard Louder, PA-C  ANESTHESIA:   general  DRAINS: none   LOCAL MEDICATIONS USED:  NONE  SPECIMEN:  No Specimen  DISPOSITION OF SPECIMEN:  N/A  COUNTS:  YES  DICTATION: .Dragon Dictation  PLAN OF CARE: back to ICU intubated  PATIENT DISPOSITION:  ICU - intubated and critically ill.  FINDINGS: No evidence of bowel ischemia.  Minimal intra-abdominal swelling.  Subcentimeter umbilical hernia.  Intact transplanted kidney left iliac fossa.  EBL: min  PROCEDURE:  Patient was identified in the ICU and then taken directly to the operating room where she was placed supine on the operating room table.  General anesthesia was induced via the already in place endotracheal tube.  The outer portion of the ABThera wound VAC was removed.  The patient's abdomen was then prepped and draped in sterile fashion.  A timeout was performed according to the surgical safety checklist.  When all was correct, we continued.  The inner layer of the wound VAC was removed.  The small bowel was examined from distal to proximal and back.  There was no evidence of bowel ischemia.  The staple line was intact at the end of the ileum.  There was minimal bowel wall edema.  The bowel was minimally dilated as well.  The patient's pressor requirement was very minimal and had only been left in place to allow for adequate sedation.    A side-to-side, functional end-to-end stapled anastomosis was created between the end of the ileum and the proximal transverse colon.  This was returned to the abdomen.  The abdomen was then irrigated.  There was no  evidence of any other gross pathology.  The transplant kidney in the left pelvic fossa was intact without evidence of ischemia.  There was no evidence of abscess.  There was no appreciable murky fluid.  The fascia was then closed using #1 looped PDS suture x 2.  There was a small umbilical hernia present along the suture line which was incorporated into the fascial closure.  The suture knot was secured down to the abdominal wall.  The wound was then dressed with wet-to-dry Betadine soaked Kerlix, gauze, and tape.  The patient was taken back to the ICU intubated.

## 2024-01-23 NOTE — Progress Notes (Signed)
 Progress Note  1 Day Post-Op  Subjective: Pt on low dose levo and small amount of vaso, likely secondary in part to need for sedation. UOP good. VAC SS.  Discussed with CCM at bedside and would not anticipate decreased pressor need between today and tomorrow.  Discussed with husband over the phone who consents to proceed to OR today.   Objective: Vital signs in last 24 hours: Temp:  [94.5 F (34.7 C)-98.2 F (36.8 C)] 97.5 F (36.4 C) (11/18 0700) Pulse Rate:  [78-100] 94 (11/18 0741) Resp:  [12-22] 17 (11/18 0741) BP: (68-141)/(54-80) 141/74 (11/18 0700) SpO2:  [94 %-100 %] 100 % (11/18 0809) FiO2 (%):  [40 %] 40 % (11/18 0742) Weight:  [51.6 kg] 51.6 kg (11/18 0600) Last BM Date :  (PTA)  Intake/Output from previous day: 11/17 0701 - 11/18 0700 In: 4886.3 [I.V.:3646.1; NG/GT:40; IV Piggyback:1200.3] Out: 2250 [Urine:450; Emesis/NG output:800; Drains:1000] Intake/Output this shift: Total I/O In: -  Out: 15 [Urine:15]  PE: General: WD, WN female who is intubated and sedated  Heart: regular, rate, and rhythm. Palpable radial and pedal pulses bilaterally Lungs: mechanically ventilated but on minimal settings Abd: soft, minimal distention, VAC with SS output, NGT with expected bilious drainage, grimace on palpation of abdomen GU: foley present with straw colored urine     Lab Results:  Recent Labs    01/22/24 0908 01/22/24 0959 01/23/24 0417  WBC 9.3  --  23.6*  HGB 8.5* 7.5* 8.7*  8.8*  HCT 25.6* 22.0* 26.1*  26.0*  PLT 162  --  166   BMET Recent Labs    01/22/24 0908 01/22/24 0959 01/23/24 0417  NA 134* 132* 135  135  K 3.7 3.6 4.5  4.5  CL 98  --  99  CO2 21*  --  24  GLUCOSE 231*  --  87  BUN 30*  --  32*  CREATININE 2.91*  --  1.96*  CALCIUM 7.7*  --  7.3*   PT/INR Recent Labs    01/21/24 2248  LABPROT 17.3*  INR 1.3*   CMP     Component Value Date/Time   NA 135 01/23/2024 0417   NA 135 01/23/2024 0417   K 4.5 01/23/2024 0417    K 4.5 01/23/2024 0417   CL 99 01/23/2024 0417   CO2 24 01/23/2024 0417   GLUCOSE 87 01/23/2024 0417   BUN 32 (H) 01/23/2024 0417   CREATININE 1.96 (H) 01/23/2024 0417   CALCIUM 7.3 (L) 01/23/2024 0417   PROT 4.1 (L) 01/22/2024 0908   ALBUMIN 2.2 (L) 01/22/2024 0908   AST 36 01/22/2024 0908   ALT 14 01/22/2024 0908   ALKPHOS 28 (L) 01/22/2024 0908   BILITOT 1.0 01/22/2024 0908   GFRNONAA 29 (L) 01/23/2024 0417   Lipase  No results found for: LIPASE     Studies/Results: DG CHEST PORT 1 VIEW Result Date: 01/22/2024 EXAM: 1 VIEW(S) XRAY OF THE CHEST 01/22/2024 12:25:00 AM COMPARISON: None available. CLINICAL HISTORY: History of ETT [id number]; Encounter for orogastric (OG) tube placement [id number] FINDINGS: LINES, TUBES AND DEVICES: Endotracheal tube with tip extended 2 cm down the right main bronchus and needs to be withdrawn 5 cm to the mid trachea. Nasogastric tube has been inserted and terminates in the distal body of the stomach. LUNGS AND PLEURA: Vascular markings are normal. No focal pneumonia is seen. No pleural effusion. No pneumothorax. HEART AND MEDIASTINUM: There is mild chronic cardiomegaly. The mediastinum is normally outlined. BONES AND SOFT TISSUES:  A bone island is again noted in the anterior right 3rd rib. Slight thoracic dextroscoliosis. IMPRESSION: 1. Endotracheal tube malposition with tip 2 cm into the right main bronchus; recommend withdrawal by 5 cm to the mid trachea. 2. Mild chronic cardiomegaly. 3. No evidence of chf or other acute chest process. 4. These results will be telephoned to the referring provider or the referring providers representative by professional radiology assistant [facility]) personnel, with communication documented in the [facility] dashboard. Electronically signed by: Francis Quam MD 01/22/2024 07:20 AM EST RP Workstation: [id number]   DG Abd Portable 1 View Result Date: 01/22/2024 EXAM: 1 VIEW XRAY OF THE ABDOMEN 01/22/2024 01:49:00 AM COMPARISON: None  available. CLINICAL HISTORY: NG tube placement. FINDINGS: LINES, TUBES AND DEVICES: Nasogastric tube tip overlies the distal body of the stomach. BOWEL: Dilated loops of small bowel were seen throughout the visualized abdomen in keeping with a distal small bowel obstruction. No free air. SOFT TISSUES: No opaque urinary calculi. BONES: No acute osseous abnormality. IMPRESSION: 1. Dilated loops of small bowel consistent with distal small bowel obstruction. No free air. 2. Nasogastric tube tip overlies the distal body of the stomach. Electronically signed by: Dorethia Molt MD 01/22/2024 02:00 AM EST RP Workstation: [id number]   CT ABDOMEN PELVIS WO CONTRAST Addendum Date: 01/22/2024 ADDENDUM #1 ADDENDUM: The critical findings of this study were discussed directly with Dr. Bari by myself at 1:37 am Electronically signed by: Dorethia Molt MD 01/22/2024 01:37 AM EST RP Workstation: [id number]   Result Date: 01/22/2024  ORIGINAL REPORT  EXAM: CT ABDOMEN AND PELVIS WITHOUT CONTRAST 01/22/2024 01:00:39 AM TECHNIQUE: CT of the abdomen and pelvis was performed without the administration of intravenous contrast. Multiplanar reformatted images are provided for review. Automated exposure control, iterative reconstruction, and/or weight-based adjustment of the mA/kV was utilized to reduce the radiation dose to as low as reasonably achievable. COMPARISON: None available. CLINICAL HISTORY: Abdominal pain, acute, nonlocalized; Sepsis. FINDINGS: LOWER CHEST: Extensive coronary artery calcifications. The distal esophagus is fluid filled with changes of esophageal dysmotility or gastroesophageal reflux. Superimposed circumferential wall thickening may reflect changes of reflux esophagitis. LIVER: The liver is unremarkable. GALLBLADDER AND BILE DUCTS: Gallbladder is unremarkable. No biliary ductal dilatation. SPLEEN: No acute abnormality. PANCREAS: No acute abnormality. ADRENAL GLANDS: No acute abnormality. KIDNEYS, URETERS AND  BLADDER: Native kidneys are markedly atrophic in keeping with given history of end-stage renal disease. Transplant kidney noted within the left iliac fossa with small perinephric fluid collection present also representing a postoperative seroma, lymphocele, or chronic hematoma. No significant mass effect on the transplant kidney. No stones in the kidneys or ureters. No hydronephrosis. No perinephric or periureteral stranding. Urinary bladder is unremarkable. GI AND BOWEL: There is a distal small bowel obstruction present with numerous loops of more proximal dilated, fluid-filled loops of small bowel as well as fluid distention of the stomach and duodenum. There is a whirl appearance of the mesenteric vasculature in keeping with a mesenteric volvulus best appreciated on image 51 - 56, series 3. The point of obstruction occurs just distal to an anastomotic line noted within the mid pelvis. The terminal ileum and colon are decompressed. PERITONEUM AND RETROPERITONEUM: Trace ascites. No free intraperitoneal gas. VASCULATURE: Aorta is normal in caliber. LYMPH NODES: No lymphadenopathy. REPRODUCTIVE ORGANS: Uterus is unremarkable. No adnexal masses. BONES AND SOFT TISSUES: Osseous structures are age appropriate. No acute bone abnormality. No lytic or blastic bone lesion. Small fat-containing umbilical hernia. IMPRESSION: 1. 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  No free intraperitoneal gas. Trace ascites. 2. Changes of esophageal dysmotility or gastroesophageal reflux with superimposed circumferential distal esophageal wall thickening, possibly reflecting reflux esophagitis. 3. Transplant kidney in the left iliac fossa with small perinephric fluid collection, possibly representing a postoperative seroma, lymphocele, or chronic hematoma. No significant mass effect on the transplant kidney. 4. Reported pancreatic transplant is not well appreciated on this examination due to  noncontrast technique and superimposed pathology. Electronically signed by: Dorethia Molt MD 01/22/2024 01:31 AM EST RP Workstation: [id number]   DG Chest Port 1 View if patient is in a treatment room. Result Date: 01/22/2024 EXAM: 1 VIEW(S) XRAY OF THE CHEST 01/22/2024 12:46:00 AM COMPARISON: Chest x-ray 05/16/2022, CT right shoulder 01/17/2015. CLINICAL HISTORY: Suspected Sepsis. FINDINGS: LUNGS AND PLEURA: Stable chronic 6 mm peripheral right upper lobe nodule. No focal pulmonary opacity. No pleural effusion. No pneumothorax. HEART AND MEDIASTINUM: No acute abnormality of the cardiac and mediastinal silhouettes. BONES AND SOFT TISSUES: No acute osseous abnormality. IMPRESSION: 1. No acute cardiopulmonary process. Electronically signed by: Morgane Naveau MD 01/22/2024 12:55 AM EST RP Workstation: [id number]    Anti-infectives: Anti-infectives (From admission, onward)    Start     Dose/Rate Route Frequency Ordered Stop   01/22/24 1200  piperacillin-tazobactam (ZOSYN) IVPB 2.25 g        2.25 g 100 mL/hr over 30 Minutes Intravenous Every 6 hours 01/22/24 0613     01/21/24 2330  ceFEPIme (MAXIPIME) 2 g in sodium chloride  0.9 % 100 mL IVPB        2 g 200 mL/hr over 30 Minutes Intravenous  Once 01/21/24 2316 01/21/24 2356   01/21/24 2330  metroNIDAZOLE (FLAGYL) IVPB 500 mg        500 mg 100 mL/hr over 60 Minutes Intravenous  Once 01/21/24 2316 01/22/24 0109        Assessment/Plan  Hx of kidney-pancreas transplant 1999, followed at [facility]  Internal hernia with small bowel volvulus and necrosis of terminal ileum and ascending colon POD1 ex-lap, resection of distal ileum and ascending colon, explant of transplanted pancreas, ABThera VAC placement Dr. Dasie  - on 2 pressors but suspect a lot of this is secondary to need for sedation, discussed with CCM as well  - NGT with bilious output as expected - Cr improving and UOP has done well over the last 24h - VAC output  - remains on SSI currently   - plan OR today for washout and hopefully restoration of bowel continuity and closure  - called husband and got consent for OR over the phone with bedside RN as a witness, consent form signed and in the chart.   FEN: NPO, NGT to LIWS VTE: SQH ID: [id number] 11/17>>  - per CCM -  HTN HLD Asthma Hx of CVA  LOS: 1 day    Burnard JONELLE Louder, [facility] 01/23/2024, 9:05 AM Please see Amion for pager number during day hours 7:00am-4:30pm

## 2024-01-23 NOTE — Progress Notes (Signed)
 Nephrology Follow-Up Consult note   Assessment/Recommendations: Sherri Todd is a/an 63 y.o. 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  Likely secondary to shock with some degree of tubular injury and ATN.  Crt now improving and UOP seems to be picking up. -Continue with supportive care including pressors as needed -IVFs PRN -Continue to monitor daily Cr, Dose meds for GFR -Monitor Daily I/Os, Daily weight  -Maintain MAP>65 for optimal renal perfusion.  -Avoid nephrotoxic medications including NSAIDs -Use synthetic opioids (Fentanyl/Dilaudid) if needed -No need for RRT   Severe septic shock: Associated with ischemic bowel.  Status post surgery as below.  Continue pressors and antibiotics per primary team    Ischemic bowel: Associated with volvulus.  Also with ischemic pancreas.  Status postsurgical resection.  Surgery following   History of kidney/pancreas transplant: In 1999.  Followed by [facility].  Hold home mycophenolate given critical illness and restart as she stabilizes.  Continue tacrolimus  as sublingual and obtain trough.  Receiving stress dose steroids because of hypotension   Lactic acidosis: Associated with septic shock. improving.  Continue with supportive care   Blood loss anemia: Associated with surgery.  Transfuse as needed   Recommendations conveyed to primary service.    [facility] 01/23/2024 8:55 AM  ___________________________________________________________  CC: Nausea and vomiting  Interval History/Subjective: Persistent hypotension on norepinephrine and vasopressin.  Creatinine improving.  Better urine output overnight.   Medications:  Current Facility-Administered Medications  Medication Dose Route  Frequency Provider Last Rate Last Admin   0.9 %  sodium chloride  infusion   Intravenous PRN Sharie Bourbon, MD       0.9 %  sodium chloride  infusion   Intravenous PRN Sharie Bourbon, MD 10 mL/hr at 01/23/24 0700 Infusion Verify at 01/23/24 0700   0.9 %  sodium chloride  infusion   Intravenous PRN Sharie Bourbon, MD 10 mL/hr at 01/23/24 0700 Infusion Verify at 01/23/24 0700   albuterol (PROVENTIL) (2.5 MG/3ML) 0.083% nebulizer solution 2.5 mg  2.5 mg Nebulization Q4H PRN Paliwal, Aditya, MD       Chlorhexidine Gluconate Cloth 2 % PADS 6 each  6 each Topical Daily Paliwal, Aditya, MD   6 each at 01/22/24 1050   fentaNYL (SUBLIMAZE) bolus via infusion 25-100 mcg  25-100 mcg Intravenous Q15 min PRN Bowser, Grace E, NP   25 mcg at 01/23/24 0520   fentaNYL in NS (10mcg/ml) infusion-PREMIX  0-400 mcg/hr Intravenous Continuous Bowser, Ronnald BRAVO, NP 5 mL/hr at 01/23/24 0700 50 mcg/hr at 01/23/24 0700   heparin injection 5,000 Units  5,000 Units Subcutaneous Q8H Dasie Leonor CROME, MD   5,000 Units at 01/23/24 0515   hydrocortisone sodium succinate (SOLU-CORTEF) 100 MG injection 100 mg  100 mg Intravenous Q8H Paliwal, Aditya, MD   100 mg at 01/23/24 0507   insulin aspart (novoLOG) injection 0-20 Units  0-20 Units Subcutaneous Q4H Sharie Bourbon, MD   3 Units at 01/23/24 0830   insulin glargine-yfgn (SEMGLEE) injection 5 Units  5 Units Subcutaneous QHS Paliwal, Aditya, MD   5 Units at 01/22/24 2257   magnesium sulfate 6 g in dextrose 5 % 100 mL IVPB  6 g Intravenous Once Paliwal, Aditya, MD 56 mL/hr at 01/23/24 0818 6 g at 01/23/24 0818   mupirocin ointment (BACTROBAN) 2 %  1 Application  1 Application Nasal BID Dasie Leonor CROME, MD   1 Application at 01/23/24 9972   norepinephrine  (LEVOPHED ) 16 mg in (0.064 mg/mL) premix infusion  0-40 mcg/min Intravenous Titrated Daren Ronnald BRAVO, NP 7.5 mL/hr at 01/23/24 0733 8 mcg/min at 01/23/24 0733   Oral care mouth rinse  15 mL Mouth Rinse Q2H  Paliwal, Aditya, MD   15 mL at 01/23/24 0830   Oral care mouth rinse  15 mL Mouth Rinse PRN Paliwal, Ria, MD       pantoprazole  (PROTONIX ) injection 40 mg  40 mg Intravenous Daily Paliwal, Aditya, MD   40 mg at 01/22/24 1050   piperacillin -tazobactam (ZOSYN ) IVPB 2.25 g  2.25 g Intravenous Q6H Dasie Leonor CROME, MD   Stopped at 01/23/24 0539   propofol  (DIPRIVAN ) 1000 MG/100ML infusion  0-80 mcg/kg/min Intravenous Continuous Daren Ronnald BRAVO, NP 10.35 mL/hr at 01/23/24 0735 35 mcg/kg/min at 01/23/24 0735   tacrolimus  (PROGRAF ) capsule 0.5 mg  0.5 mg Sublingual BID Macel Jayson PARAS, MD   0.5 mg at 01/22/24 2250   vasopressin  (PITRESSIN) 20 Units in 100 mL (0.2 unit/mL) infusion-*FOR SHOCK*  0.04 Units/min Intravenous Titrated Sharie Bourbon, MD 6 mL/hr at 01/23/24 0734 0.02 Units/min at 01/23/24 0734      Review of Systems: Unable to obtain due to the patient's AMS/sedation  Physical Exam: Vitals:   01/23/24 0742 01/23/24 0809  BP:    Pulse:    Resp:    Temp:    SpO2: 100% 100%   Total I/O In: -  Out: 15 [Urine:15]  Intake/Output Summary (Last 24 hours) at 01/23/2024 0855 Last data filed at 01/23/2024 0730 Gross per 24 hour  Intake 3725.08 ml  Output 2265 ml  Net 1460.08 ml   Constitutional: critically ill-appearing, no acute distress ENMT: ears and nose without scars or lesions, MMM CV: normal rate, no edema Respiratory: Bilateral chest rise, ventilated Gastrointestinal: Surgical incision present, not markedly distended Skin: no visible lesions or rashes Psych: Sedated, not interactive   Test Results I personally reviewed new and old clinical labs and radiology tests Lab Results  Component Value Date   NA 135 01/23/2024   NA 135 01/23/2024   K 4.5 01/23/2024   K 4.5 01/23/2024   CL 99 01/23/2024   CO2 24 01/23/2024   BUN 32 (H) 01/23/2024   CREATININE 1.96 (H) 01/23/2024   CALCIUM  7.3 (L) 01/23/2024   ALBUMIN  2.2 (L) 01/22/2024    CBC Recent Labs  Lab  01/21/24 2248 01/21/24 2323 01/22/24 0908 01/22/24 0959 01/23/24 0417  WBC 16.3*  --  9.3  --  23.6*  NEUTROABS 14.3*  --   --   --   --   HGB 13.3   < > 8.5* 7.5* 8.7*  8.8*  HCT 39.7   < > 25.6* 22.0* 26.1*  26.0*  MCV 107.9*  --  107.1*  --  106.5*  PLT 214  --  162  --  166   < > = values in this interval not displayed.

## 2024-01-23 NOTE — Progress Notes (Signed)
 Brief Nutrition Note:  Pt remains on vent support, plan to return to OR today with Dr. Aron. Plan for washout with possible anastomosis of SB to transverse colon if no evidence of ischemia, possible closure of abdomen if swelling minimal and no questionable bowel.   NPO, bowel in discontinuity.  NG with bluish green output; 800 mL in 24 hours Wound VAC: 1000 mL in 24 hours  UOP only 450 mL in 24 hours. Weight up to 51.6 kg this AM, 49.3 kg yesterday. Admit wt 47.6 kg. Net +3.8 L per I/O flow sheet  Creatinine 1.96, BUN 32. Mag 1.4. No phosphorus since admission( will need to check phosphorus prior to any initiation of nutrition support) CBGs 72-208 (goal 140-180). Currently on sliding scale. DM Coordinator following.   Recommend considering TPN in the next 48-72 hours if unable to utilize GI tract for nutrition.   Pt will likely need PERT therapy once on po diet. If pt requires continuous TF, recommend considering Relizorb.   Betsey Finger MS, RDN, LDN, CNSC Registered Dietitian 3 Clinical Nutrition RD Inpatient Contact Info in Amion

## 2024-01-23 NOTE — Inpatient Diabetes Management (Signed)
 Inpatient Diabetes Program Recommendations  AACE/ADA: New Consensus Statement on Inpatient Glycemic Control (2015)  Target Ranges:  Prepandial:   less than 140 mg/dL      Peak postprandial:   less than 180 mg/dL (1-2 hours)      Critically ill patients:  140 - 180 mg/dL   Lab Results  Component Value Date   GLUCAP 134 (H) 01/23/2024   HGBA1C 5.0 01/22/2024    Review of Glycemic Control  Latest Reference Range & Units 01/22/24 05:52 01/22/24 08:44 01/22/24 12:18 01/22/24 15:55 01/22/24 19:35 01/23/24 00:18 01/23/24 04:18 01/23/24 07:31  Glucose-Capillary 70 - 99 mg/dL 884 (H) 808 (H) 791 (H) 184 (H) 157 (H) 72 90 134 (H)  (H): Data is abnormally high Diabetes history:  Type 1 DM (ex-transplanted pancreas) Outpatient Diabetes medications: none Current orders for Inpatient glycemic control: Semglee 5 untis at bedtime, Novolog 0-20 units Q4H Solucortef 100 mg Q8H  Inpatient Diabetes Program Recommendations:    Consider reducing correction to Novolog 0-6 units Q4H.   Will plan to see when appropriate.   Thanks, Tinnie Minus, MSN, RNC-OB Diabetes Coordinator [phone] (8a-5p)

## 2024-01-23 NOTE — Progress Notes (Signed)
 eLink Physician-Brief Progress Note Patient Name: Sherri Todd DOB: 02/11/1961 MRN: [id number]   Date of Service  01/23/2024  HPI/Events of Note  Mag 1.4  eICU Interventions  Mag Sulfate      Intervention Category Minor Interventions: Electrolytes abnormality - evaluation and management  Aurorah Schlachter 01/23/2024, 5:28 AM

## 2024-01-24 ENCOUNTER — Encounter (HOSPITAL_COMMUNITY): Payer: Self-pay | Admitting: General Surgery

## 2024-01-24 DIAGNOSIS — R6521 Severe sepsis with septic shock: Secondary | ICD-10-CM | POA: Diagnosis not present

## 2024-01-24 DIAGNOSIS — N179 Acute kidney failure, unspecified: Secondary | ICD-10-CM | POA: Diagnosis not present

## 2024-01-24 DIAGNOSIS — A419 Sepsis, unspecified organism: Secondary | ICD-10-CM | POA: Diagnosis not present

## 2024-01-24 DIAGNOSIS — K559 Vascular disorder of intestine, unspecified: Secondary | ICD-10-CM | POA: Diagnosis not present

## 2024-01-24 DIAGNOSIS — E440 Moderate protein-calorie malnutrition: Secondary | ICD-10-CM

## 2024-01-24 LAB — AMMONIA: Ammonia: 16 umol/L (ref 9–35)

## 2024-01-24 LAB — POCT I-STAT 7, (LYTES, BLD GAS, ICA,H+H)
Acid-base deficit: 1 mmol/L (ref 0.0–2.0)
Bicarbonate: 23.5 mmol/L (ref 20.0–28.0)
Calcium, Ion: 1.12 mmol/L — ABNORMAL LOW (ref 1.15–1.40)
HCT: 22 % — ABNORMAL LOW (ref 36.0–46.0)
Hemoglobin: 7.5 g/dL — ABNORMAL LOW (ref 12.0–15.0)
O2 Saturation: 100 %
Potassium: 3.8 mmol/L (ref 3.5–5.1)
Sodium: 134 mmol/L — ABNORMAL LOW (ref 135–145)
TCO2: 25 mmol/L (ref 22–32)
pCO2 arterial: 38.7 mmHg (ref 32–48)
pH, Arterial: 7.391 (ref 7.35–7.45)
pO2, Arterial: 306 mmHg — ABNORMAL HIGH (ref 83–108)

## 2024-01-24 LAB — CBC
HCT: 24 % — ABNORMAL LOW (ref 36.0–46.0)
Hemoglobin: 8.2 g/dL — ABNORMAL LOW (ref 12.0–15.0)
MCH: 34.3 pg — ABNORMAL HIGH (ref 26.0–34.0)
MCHC: 34.2 g/dL (ref 30.0–36.0)
MCV: 100.4 fL — ABNORMAL HIGH (ref 80.0–100.0)
Platelets: 131 K/uL — ABNORMAL LOW (ref 150–400)
RBC: 2.39 MIL/uL — ABNORMAL LOW (ref 3.87–5.11)
RDW: 17.6 % — ABNORMAL HIGH (ref 11.5–15.5)
WBC: 19.1 K/uL — ABNORMAL HIGH (ref 4.0–10.5)
nRBC: 0 % (ref 0.0–0.2)

## 2024-01-24 LAB — RENAL FUNCTION PANEL
Albumin: 1.9 g/dL — ABNORMAL LOW (ref 3.5–5.0)
Anion gap: 12 (ref 5–15)
BUN: 35 mg/dL — ABNORMAL HIGH (ref 8–23)
CO2: 27 mmol/L (ref 22–32)
Calcium: 8.2 mg/dL — ABNORMAL LOW (ref 8.9–10.3)
Chloride: 99 mmol/L (ref 98–111)
Creatinine, Ser: 1.5 mg/dL — ABNORMAL HIGH (ref 0.44–1.00)
GFR, Estimated: 39 mL/min — ABNORMAL LOW (ref >60)
Glucose, Bld: 120 mg/dL — ABNORMAL HIGH (ref 70–99)
Phosphorus: 5.2 mg/dL — ABNORMAL HIGH (ref 2.5–4.6)
Potassium: 3.7 mmol/L (ref 3.5–5.1)
Sodium: 138 mmol/L (ref 135–145)

## 2024-01-24 LAB — GLUCOSE, CAPILLARY
Glucose-Capillary: 102 mg/dL — ABNORMAL HIGH (ref 70–99)
Glucose-Capillary: 135 mg/dL — ABNORMAL HIGH (ref 70–99)
Glucose-Capillary: 145 mg/dL — ABNORMAL HIGH (ref 70–99)
Glucose-Capillary: 194 mg/dL — ABNORMAL HIGH (ref 70–99)
Glucose-Capillary: 91 mg/dL (ref 70–99)
Glucose-Capillary: 92 mg/dL (ref 70–99)

## 2024-01-24 MED ORDER — LACTATED RINGERS IV SOLN: INTRAVENOUS | Status: AC

## 2024-01-24 MED ORDER — BOOST / RESOURCE BREEZE PO LIQD CUSTOM
1.0000 | Freq: Three times a day (TID) | ORAL | Status: DC
  Administered 2024-01-25 (×3): 1 via ORAL

## 2024-01-24 MED ORDER — INSULIN ASPART 100 UNIT/ML IJ SOLN
0.0000 [IU] | INTRAMUSCULAR | Status: DC
  Administered 2024-01-24: 2 [IU] via SUBCUTANEOUS
  Administered 2024-01-25: 3 [IU] via SUBCUTANEOUS
  Administered 2024-01-25: 8 [IU] via SUBCUTANEOUS
  Administered 2024-01-25: 5 [IU] via SUBCUTANEOUS
  Administered 2024-01-25: 3 [IU] via SUBCUTANEOUS
  Administered 2024-01-25: 5 [IU] via SUBCUTANEOUS
  Administered 2024-01-26: 11 [IU] via SUBCUTANEOUS
  Administered 2024-01-26: 5 [IU] via SUBCUTANEOUS
  Filled 2024-01-24: qty 8
  Filled 2024-01-24: qty 5
  Filled 2024-01-24: qty 3
  Filled 2024-01-24: qty 5
  Filled 2024-01-24: qty 11
  Filled 2024-01-24: qty 2
  Filled 2024-01-24: qty 5
  Filled 2024-01-24: qty 3

## 2024-01-24 MED ORDER — ORAL CARE MOUTH RINSE
15.0000 mL | OROMUCOSAL | Status: DC | PRN
Start: 2024-01-24 — End: 2024-01-30

## 2024-01-24 MED ORDER — POTASSIUM CHLORIDE 20 MEQ PO PACK
20.0000 meq | PACK | Freq: Once | ORAL | Status: AC
  Administered 2024-01-24: 20 meq via ORAL
  Filled 2024-01-24: qty 1

## 2024-01-24 MED ORDER — HYDROMORPHONE HCL 1 MG/ML IJ SOLN: 0.5000 mg | INTRAMUSCULAR | Status: DC | PRN

## 2024-01-24 MED ORDER — ACETAMINOPHEN 160 MG/5ML PO SOLN
650.0000 mg | Freq: Four times a day (QID) | ORAL | Status: DC
  Administered 2024-01-24 – 2024-01-26 (×8): 650 mg
  Filled 2024-01-24 (×9): qty 20.3

## 2024-01-24 MED ORDER — OXYCODONE HCL 5 MG/5ML PO SOLN
5.0000 mg | ORAL | Status: DC | PRN
  Administered 2024-01-24: 5 mg
  Filled 2024-01-24: qty 10

## 2024-01-24 MED ORDER — PIPERACILLIN-TAZOBACTAM 3.375 G IVPB
3.3750 g | Freq: Three times a day (TID) | INTRAVENOUS | Status: DC
  Administered 2024-01-24 – 2024-01-30 (×17): 3.375 g via INTRAVENOUS
  Filled 2024-01-24 (×17): qty 50

## 2024-01-24 NOTE — Evaluation (Signed)
 Clinical/Bedside Swallow Evaluation Patient Details  Name: Sherri Todd MRN: [id number] Date of Birth: [birth year]  Today's Date: 01/24/2024 Time: SLP Start Time (ACUTE ONLY): 1425 SLP Stop Time (ACUTE ONLY): 1441 SLP Time Calculation (min) (ACUTE ONLY): 16 min  Past Medical History:  Past Medical History:  Diagnosis Date   Asthma    High cholesterol    Hypertension    Stroke [facility])    Past Surgical History:  Past Surgical History:  Procedure Laterality Date   ABDOMINAL HYSTERECTOMY     BOWEL RESECTION  01/22/2024   Procedure: SMALL BOWEL RESECTION;  Surgeon: Dasie Leonor CROME, MD;  Location: [facility] OR;  Service: General;;   CESAREAN SECTION     COMBINED KIDNEY-PANCREAS TRANSPLANT     FOOT SURGERY     Toe repair   LAPAROTOMY N/A 01/22/2024   Procedure: LAPAROTOMY, EXPLORATORY;  Surgeon: Dasie Leonor CROME, MD;  Location: [facility] OR;  Service: General;  Laterality: N/A;   LAPAROTOMY N/A 01/23/2024   Procedure: LAPAROTOMY, EXPLORATORY;  Surgeon: Aron Shoulders, MD;  Location: [facility] OR;  Service: General;  Laterality: N/A;  RE EXPLORE LAPAROTOMY, ABDOMEN WASHOUT , POSSIBLE ANASTOMOSIS, POSSIBLE OSTOMY, POSSIBLE ABDOMEN CLOSURE   PARTIAL COLECTOMY N/A 01/22/2024   Procedure: COLECTOMY, PARTIAL;  Surgeon: Dasie Leonor CROME, MD;  Location: [facility] OR;  Service: General;  Laterality: N/A;   REPLACEMENT TOTAL KNEE Bilateral    Partial on Left and complete on right   HPI:  Sherri Todd is a 63 y.o. 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 Underwent emergent laparotomy in 11/17 and returned to OR on 11/18 for anastomosis and closure. Extubated on 11/19. MD wrote can try sips of clears on 11/19. PMH significant for Asthma, HL, HTN and right hemisphere CVA without residual deficits, , Type 1 DM  resulting in kidney disease and transplant.    Assessment /  Plan / Recommendation  Clinical Impression  Pt tolerates consecutive sips of water without signs of aspiration. Pt cleared some secretions from the back of her throat after being given ice, agrees that ther mouth is dry. Pt able to tolerate sips without an obervable impairment. Vocal quality clear, though pts voice is very high pitched. She says that is her normal voice and complains that her husband cant ever hear her. Will write an order for clear liquid. MD to advance diet when appropriate. SLP will sign off. SLP Visit Diagnosis: Dysphagia, unspecified (R13.10)    Aspiration Risk  Mild aspiration risk    Diet Recommendation      Liquid Administration via: Cup;Straw Medication Administration: Whole meds with liquid Supervision: Staff to assist with self feeding Compensations: Slow rate;Small sips/bites Postural Changes: Seated upright at 90 degrees    Other  Recommendations       Assistance Recommended at Discharge    Functional Status Assessment    Frequency and Duration            Prognosis        Swallow Study   General HPI: Sherri Todd is a 63 y.o. 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 Underwent emergent laparotomy in 11/17 and returned to OR on 11/18 for anastomosis and closure. Extubated on 11/19. MD wrote can try sips of clears  on 11/19. PMH significant for Asthma, HL, HTN and right hemisphere CVA without residual deficits, , Type 1 DM  resulting in kidney disease and transplant. Type of Study: Bedside Swallow Evaluation Previous Swallow Assessment: none in chart Diet Prior to this Study: NPO;Large bore NG tube Temperature Spikes Noted: No Respiratory Status: Room air History of Recent Intubation: Yes Total duration of intubation (days): 3 days Date extubated: 01/24/24 Behavior/Cognition:  Alert;Cooperative;Confused Oral Cavity Assessment: Within Functional Limits Oral Care Completed by SLP: No Oral Cavity - Dentition: Adequate natural dentition Vision: Functional for self-feeding Self-Feeding Abilities: Needs assist Patient Positioning: Upright in bed Baseline Vocal Quality: Normal;Other (comment) (very high pitched voice) Volitional Cough: Weak Volitional Swallow: Able to elicit    Oral/Motor/Sensory Function Overall Oral Motor/Sensory Function: Within functional limits   Ice Chips Ice chips: Impaired Pharyngeal Phase Impairments: Throat Clearing - Immediate   Thin Liquid Thin Liquid: Within functional limits Presentation: Straw    Nectar Thick Nectar Thick Liquid: Not tested   Honey Thick Honey Thick Liquid: Not tested   Puree Puree: Not tested   Solid     Solid: Not tested      Sherri Todd, Sherri Todd 01/24/2024,2:54 PM

## 2024-01-24 NOTE — Progress Notes (Signed)
 Nutrition Follow-up  DOCUMENTATION CODES:   Non-severe (moderate) malnutrition in context of chronic illness  INTERVENTION:  Diet advanced to clear liquids Monitor for further ability to advance to more solid textured diet in addition to nutritional adequate   Likely need to consider Relizorb versus Creon dependent upon method of enteral nutrition; concern for malabsorption and need for pancreatic enzymes given removal of transplanted pancreas; Surgery planning to add once on a solid diet   Recommend insulin gtt with initiation of any nutrition support (TPN vs EN)    Noted pt with documented allergies to gluten, soy.   NUTRITION DIAGNOSIS:   Moderate Malnutrition related to chronic illness (ESRD s/p pancreatic and kidney transplants) as evidenced by moderate fat depletion, severe muscle depletion. 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  PMH includes type 1 DM and ESRD s/p pancreas and kidney transplants in 1999, HTN, CVA. 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 No signs of aspiration and cleared for diet advancement per medical team. Clear liquids ordered given  extubation. NGT remains in place.   Patient just post extubation at time of visit. Not very conversant and concerned with recent procedures and removal of pancreas.   Reached out to Surgery to discuss addition of creon with oral intake to support adequate absorption.   Admit weight: 47.6 kg Current weight: 49 kg  Medications: solu-cortef, SSI 0-15 units q4h, semglee 5 units daily, protonix, klor-con (once) Drips: Levo @ 0.7467 mcg/min IV abx  Labs:  BUN 35 Cr 1.50 Phosphorus 5.2 GFR 39 CBG's 91-194 x24 hours  Nutrition Focused Physical Findings: Given findings on nutrition focused physical exam, patient meets criteria for moderate malnutrition however suspect she has a more severe degree of malnutrition though cannot confirm at this time. Will re-assess once able to gather more nutrition history.  Flowsheet Row Most Recent Value  Orbital Region Severe depletion  Upper Arm Region Moderate depletion  Thoracic and Lumbar Region Moderate depletion  Buccal Region Moderate depletion  Temple Region Moderate depletion  Clavicle Bone Region Severe depletion  Clavicle and Acromion Bone Region Severe depletion  Scapular Bone Region Severe depletion  Dorsal Hand Unable to assess  [mild edema]  Patellar Region Unable to assess  Anterior Thigh Region Unable to assess  Posterior Calf Region Unable to assess  Edema (RD Assessment) Mild  [generalized, BUE]  Hair Reviewed  Eyes Unable to assess  Mouth Unable to assess  Skin Reviewed  Nails Reviewed   Diet Order:   Diet Order             Diet clear liquid Room service appropriate? Yes; Fluid consistency: Thin  Diet effective now  EDUCATION NEEDS:   Not appropriate for education at this time  Skin:  Skin Assessment: Skin Integrity Issues: Skin Integrity Issues:: Wound VAC Wound Vac: abdomen removed  Last BM:  none since admit  Height:   Ht Readings from Last 1 Encounters:  01/23/24 5' 1 (1.549 m)     Weight:   Wt Readings from Last 1 Encounters:  01/24/24 49 kg   BMI:  Body mass index is 20.41 kg/m.  Estimated Nutritional Needs:   Kcal:  1500-1700 kcals  Protein:  75-90 g  Fluid:  1.5L  Allie Amilah Greenspan, RDN, LDN Clinical Nutrition See AMiON for contact information.

## 2024-01-24 NOTE — Progress Notes (Signed)
 Progress Note  1 Day Post-Op  Subjective: Pt off pressors, possible extubation today. NGT output thin and bilious but low volume. Husband at bedside.   Objective: Vital signs in last 24 hours: Temp:  [98 F (36.7 C)] 98 F (36.7 C) (11/19 0755) Pulse Rate:  [68-92] 74 (11/19 0900) Resp:  [10-27] 16 (11/19 0900) BP: (81-141)/(50-79) 100/62 (11/19 0900) SpO2:  [92 %-100 %] 99 % (11/19 0900) Arterial Line BP: (90-145)/(41-67) 107/47 (11/19 0900) FiO2 (%):  [40 %] 40 % (11/19 0720) Weight:  [49 kg] 49 kg (11/19 0500) Last BM Date :  (PTA)  Intake/Output from previous day: 11/18 0701 - 11/19 0700 In: 1390 [I.V.:573.1; Blood:315; NG/GT:90; IV Piggyback:411.9] Out: 1435 [Urine:1385; Blood:50] Intake/Output this shift: Total I/O In: 13.2 [I.V.:13.2] Out: 210 [Urine:180; Emesis/NG output:30]  PE: General: WD, WN female who is intubated but alert  Heart: regular, rate, and rhythm. Palpable radial and pedal pulses bilaterally Lungs: SBT, ETT in place Abd: soft, minimal distention, NGT with thin low volume bilious drainage, midline wound clean  GU: foley present with straw colored urine     Lab Results:  Recent Labs    01/22/24 0908 01/22/24 0959 01/23/24 0417 01/23/24 1256  WBC 9.3  --  23.6*  --   HGB 8.5*   < > 8.7*  8.8* 7.5*  HCT 25.6*   < > 26.1*  26.0* 22.0*  PLT 162  --  166  --    < > = values in this interval not displayed.   BMET Recent Labs    01/23/24 0417 01/23/24 1256 01/24/24 0637  NA 135  135 134* 138  K 4.5  4.5 3.8 3.7  CL 99  --  99  CO2 24  --  27  GLUCOSE 87  --  120*  BUN 32*  --  35*  CREATININE 1.96*  --  1.50*  CALCIUM 7.3*  --  8.2*   PT/INR Recent Labs    01/21/24 2248  LABPROT 17.3*  INR 1.3*   CMP     Component Value Date/Time   NA 138 01/24/2024 0637   K 3.7 01/24/2024 0637   CL 99 01/24/2024 0637   CO2 27 01/24/2024 0637   GLUCOSE 120 (H) 01/24/2024 0637   BUN 35 (H) 01/24/2024 0637   CREATININE 1.50 (H)  01/24/2024 0637   CALCIUM 8.2 (L) 01/24/2024 0637   PROT 4.1 (L) 01/22/2024 0908   ALBUMIN 1.9 (L) 01/24/2024 0637   AST 36 01/22/2024 0908   ALT 14 01/22/2024 0908   ALKPHOS 28 (L) 01/22/2024 0908   BILITOT 1.0 01/22/2024 0908   GFRNONAA 39 (L) 01/24/2024 0637   Lipase  No results found for: LIPASE     Studies/Results: DG CHEST PORT 1 VIEW Result Date: 01/22/2024 EXAM: 1 VIEW(S) XRAY OF THE CHEST 01/22/2024 12:25:00 AM COMPARISON: None available. CLINICAL HISTORY: History of ETT [id number]; Encounter for orogastric (OG) tube placement [id number] FINDINGS: LINES, TUBES AND DEVICES: Endotracheal tube with tip extended 2 cm down the right main bronchus and needs to be withdrawn 5 cm to the mid trachea. Nasogastric tube has been inserted and terminates in the distal body of the stomach. LUNGS AND PLEURA: Vascular markings are normal. No focal pneumonia is seen. No pleural effusion. No pneumothorax. HEART AND MEDIASTINUM: There is mild chronic cardiomegaly. The mediastinum is normally outlined. BONES AND SOFT TISSUES: A bone island is again noted in the anterior right 3rd rib. Slight thoracic dextroscoliosis. IMPRESSION: 1. Endotracheal tube malposition  with tip 2 cm into the right main bronchus; recommend withdrawal by 5 cm to the mid trachea. 2. Mild chronic cardiomegaly. 3. No evidence of chf or other acute chest process. 4. These results will be telephoned to the referring provider or the referring providers representative by professional radiology assistant [facility]) personnel, with communication documented in the [facility] dashboard. Electronically signed by: Francis Quam MD 01/22/2024 07:20 AM EST RP Workstation: [id number]   DG Abd Portable 1 View Result Date: 01/22/2024 EXAM: 1 VIEW XRAY OF THE ABDOMEN 01/22/2024 01:49:00 AM COMPARISON: None available. CLINICAL HISTORY: NG tube placement. FINDINGS: LINES, TUBES AND DEVICES: Nasogastric tube tip overlies the distal body of the stomach. BOWEL: Dilated  loops of small bowel were seen throughout the visualized abdomen in keeping with a distal small bowel obstruction. No free air. SOFT TISSUES: No opaque urinary calculi. BONES: No acute osseous abnormality. IMPRESSION: 1. Dilated loops of small bowel consistent with distal small bowel obstruction. No free air. 2. Nasogastric tube tip overlies the distal body of the stomach. Electronically signed by: Dorethia Molt MD 01/22/2024 02:00 AM EST RP Workstation: [id number]   CT ABDOMEN PELVIS WO CONTRAST Addendum Date: 01/22/2024 ADDENDUM #1 ADDENDUM: The critical findings of this study were discussed directly with Dr. Bari by myself at 1:37 am Electronically signed by: Dorethia Molt MD 01/22/2024 01:37 AM EST RP Workstation: [id number]   Result Date: 01/22/2024  ORIGINAL REPORT  EXAM: CT ABDOMEN AND PELVIS WITHOUT CONTRAST 01/22/2024 01:00:39 AM TECHNIQUE: CT of the abdomen and pelvis was performed without the administration of intravenous contrast. Multiplanar reformatted images are provided for review. Automated exposure control, iterative reconstruction, and/or weight-based adjustment of the mA/kV was utilized to reduce the radiation dose to as low as reasonably achievable. COMPARISON: None available. CLINICAL HISTORY: Abdominal pain, acute, nonlocalized; Sepsis. FINDINGS: LOWER CHEST: Extensive coronary artery calcifications. The distal esophagus is fluid filled with changes of esophageal dysmotility or gastroesophageal reflux. Superimposed circumferential wall thickening may reflect changes of reflux esophagitis. LIVER: The liver is unremarkable. GALLBLADDER AND BILE DUCTS: Gallbladder is unremarkable. No biliary ductal dilatation. SPLEEN: No acute abnormality. PANCREAS: No acute abnormality. ADRENAL GLANDS: No acute abnormality. KIDNEYS, URETERS AND BLADDER: Native kidneys are markedly atrophic in keeping with given history of end-stage renal disease. Transplant kidney noted within the left iliac fossa with  small perinephric fluid collection present also representing a postoperative seroma, lymphocele, or chronic hematoma. No significant mass effect on the transplant kidney. No stones in the kidneys or ureters. No hydronephrosis. No perinephric or periureteral stranding. Urinary bladder is unremarkable. GI AND BOWEL: There is a distal small bowel obstruction present with numerous loops of more proximal dilated, fluid-filled loops of small bowel as well as fluid distention of the stomach and duodenum. There is a whirl appearance of the mesenteric vasculature in keeping with a mesenteric volvulus best appreciated on image 51 - 56, series 3. The point of obstruction occurs just distal to an anastomotic line noted within the mid pelvis. The terminal ileum and colon are decompressed. PERITONEUM AND RETROPERITONEUM: Trace ascites. No free intraperitoneal gas. VASCULATURE: Aorta is normal in caliber. LYMPH NODES: No lymphadenopathy. REPRODUCTIVE ORGANS: Uterus is unremarkable. No adnexal masses. BONES AND SOFT TISSUES: Osseous structures are age appropriate. No acute bone abnormality. No lytic or blastic bone lesion. Small fat-containing umbilical hernia. IMPRESSION: 1. 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 No free intraperitoneal gas. Trace ascites. 2. Changes of esophageal dysmotility or gastroesophageal reflux with superimposed circumferential distal esophageal wall  thickening, possibly reflecting reflux esophagitis. 3. Transplant kidney in the left iliac fossa with small perinephric fluid collection, possibly representing a postoperative seroma, lymphocele, or chronic hematoma. No significant mass effect on the transplant kidney. 4. Reported pancreatic transplant is not well appreciated on this examination due to noncontrast technique and superimposed pathology. Electronically signed by: Dorethia Molt MD 01/22/2024 01:31 AM EST RP Workstation: [id number]   DG Chest Port 1 View if  patient is in a treatment room. Result Date: 01/22/2024 EXAM: 1 VIEW(S) XRAY OF THE CHEST 01/22/2024 12:46:00 AM COMPARISON: Chest x-ray 05/16/2022, CT right shoulder 01/17/2015. CLINICAL HISTORY: Suspected Sepsis. FINDINGS: LUNGS AND PLEURA: Stable chronic 6 mm peripheral right upper lobe nodule. No focal pulmonary opacity. No pleural effusion. No pneumothorax. HEART AND MEDIASTINUM: No acute abnormality of the cardiac and mediastinal silhouettes. BONES AND SOFT TISSUES: No acute osseous abnormality. IMPRESSION: 1. No acute cardiopulmonary process. Electronically signed by: Morgane Naveau MD 01/22/2024 12:55 AM EST RP Workstation: [id number]    Anti-infectives: Anti-infectives (From admission, onward)    Start     Dose/Rate Route Frequency Ordered Stop   01/22/24 1200  piperacillin-tazobactam (ZOSYN) IVPB 2.25 g        2.25 g 100 mL/hr over 30 Minutes Intravenous Every 6 hours 01/22/24 0613     01/21/24 2330  ceFEPIme (MAXIPIME) 2 g in sodium chloride  0.9 % 100 mL IVPB        2 g 200 mL/hr over 30 Minutes Intravenous  Once 01/21/24 2316 01/21/24 2356   01/21/24 2330  metroNIDAZOLE (FLAGYL) IVPB 500 mg        500 mg 100 mL/hr over 60 Minutes Intravenous  Once 01/21/24 2316 01/22/24 0109        Assessment/Plan  Hx of kidney-pancreas transplant 1999, followed at [facility]  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 Dasie  POD1 ex-lap, abdominal washout, ileocolic anastomosis, abdominal closure Dr. Aron - off pressors - possible extubation per CCM - if extubated ok to start sips of clears, if not extubated ok to start trickle TF - continue abx - BID WTD to midline wound - AROBF  FEN: NPO, NGT to LIWS VTE: SQH ID: Zosyn 11/17>>  - per CCM -  HTN HLD Asthma Hx of CVA  LOS: 2 days    Burnard JONELLE Louder, [facility] 01/24/2024, 9:23 AM Please see Amion for pager number during day hours 7:00am-4:30pm

## 2024-01-24 NOTE — Progress Notes (Signed)
 Nephrology Follow-Up Consult note   Assessment/Recommendations: Sherri Todd is a/an 63 y.o. 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  Likely secondary to shock with some degree of tubular injury. Improving -Continue with supportive care including pressors as needed -IVFs PRN; ordered today -Continue to monitor daily Cr, Dose meds for GFR -Monitor Daily I/Os, Daily weight  -Maintain MAP>65 for optimal renal perfusion.  -Avoid nephrotoxic medications including NSAIDs -Use synthetic opioids (Fentanyl/Dilaudid) if needed -No need for RRT   Severe septic shock: Associated with ischemic bowel.  Status post surgery as below.  Continue pressors and antibiotics per primary team    Ischemic bowel: Associated with volvulus.  Also with ischemic pancreas.  Status postsurgical resection.  Surgery following   History of kidney/pancreas transplant: In 1999.  Followed by [facility].  Hold home mycophenolate given critical illness and restart as she stabilizes.  Continue tacrolimus  as sublingual and f/u trough.  Receiving stress dose steroids because of hypotension. Switch to pred 5mg  equivalent when able. Restart MMF hopefully when tolerating PO   Lactic acidosis: Associated with septic shock. improving.  Continue with supportive care   Blood loss anemia: Associated with surgery.  Transfuse as needed   Recommendations conveyed to primary service.    [facility] 01/24/2024 9:54 AM  ___________________________________________________________  CC: Nausea and vomiting  Interval History/Subjective: Now off pressors.  Went back to the OR yesterday with abdominal closure.  Good urine output.  Creatinine  downtrending.   Medications:  Current Facility-Administered Medications  Medication Dose Route Frequency Provider Last Rate Last Admin   0.9 %  sodium chloride  infusion   Intravenous PRN Vicci Sor R, PA-C       0.9 %  sodium chloride  infusion   Intravenous PRN Johnson, Kelly R, PA-C   Stopped at 01/23/24 1225   0.9 %  sodium chloride  infusion   Intravenous PRN Johnson, Kelly R, PA-C   Stopped at 01/23/24 1224   acetaminophen  (TYLENOL ) 160 MG/5ML solution 650 mg  650 mg Per Tube Q6H Vicci Sor R, PA-C       albuterol (PROVENTIL) (2.5 MG/3ML) 0.083% nebulizer solution 2.5 mg  2.5 mg Nebulization Q4H PRN Johnson, Kelly R, PA-C       Chlorhexidine Gluconate Cloth 2 % PADS 6 each  6 each Topical Daily Johnson, Kelly R, PA-C   6 each at 01/23/24 1027   heparin injection 5,000 Units  5,000 Units Subcutaneous Q8H Vicci Sor SAUNDERS, PA-C   5,000 Units at 01/24/24 0600   hydrocortisone sodium succinate (SOLU-CORTEF) 100 MG injection 100 mg  100 mg Intravenous Q8H Vicci Sor SAUNDERS, PA-C   100 mg at 01/24/24 0608   HYDROmorphone (DILAUDID) injection 0.5 mg  0.5 mg Intravenous Q2H PRN Johnson, Kelly R, PA-C       insulin aspart (novoLOG) injection 0-20 Units  0-20 Units Subcutaneous Q4H Johnson, Kelly R, PA-C   3 Units at 01/24/24 0410   insulin glargine-yfgn [facility]) injection 5 Units  5 Units Subcutaneous QHS Johnson, Kelly R, PA-C   5 Units at 01/23/24 2130   mupirocin ointment (BACTROBAN) 2 % 1 Application  1 Application Nasal BID Vicci Sor SAUNDERS, PA-C   1 Application at 01/24/24 0944   norepinephrine (LEVOPHED) 16 mg in 250mL (0.064 mg/mL) premix  infusion  0-40 mcg/min Intravenous Titrated Vicci Burnard SAUNDERS, [place]   Stopped at 01/24/24 9244   Oral care mouth rinse  15 mL Mouth Rinse Q2H Vicci Burnard SAUNDERS, PA-C   15 mL at 01/24/24 9056   Oral care mouth rinse  15 mL Mouth Rinse PRN Vicci Burnard SAUNDERS, PA-C       oxyCODONE (ROXICODONE) 5 MG/5ML solution 5-10 mg  5-10 mg Per Tube Q4H PRN Vicci Burnard SAUNDERS, PA-C       pantoprazole (PROTONIX) injection 40 mg  40 mg Intravenous Daily Vicci Burnard SAUNDERS, PA-C   40 mg at 01/24/24 0943   piperacillin-tazobactam (ZOSYN) IVPB 2.25 g  2.25 g Intravenous Q6H Vicci Burnard SAUNDERS, [place]   Stopped at 01/24/24 9361   tacrolimus  (PROGRAF ) capsule 0.5 mg  0.5 mg Sublingual BID Vicci Burnard SAUNDERS, PA-C   0.5 mg at 01/23/24 2129   vasopressin (PITRESSIN) 20 Units in 100 mL (0.2 unit/mL) infusion-*FOR SHOCK*  0.04 Units/min Intravenous Titrated Vicci Burnard SAUNDERS, PA-C   Stopped at 01/24/24 0401      Review of Systems: Unable to obtain due to the patient's AMS/sedation  Physical Exam: Vitals:   01/24/24 0800 01/24/24 0900  BP: 100/71 100/62  Pulse: 79 74  Resp: (!) 25 16  Temp:    SpO2: 92% 99%   Total I/O In: 13.2 [I.V.:13.2] Out: 210 [Urine:180; Emesis/NG output:30]  Intake/Output Summary (Last 24 hours) at 01/24/2024 0954 Last data filed at 01/24/2024 0900 Gross per 24 hour  Intake 1281.05 ml  Output 1570 ml  Net -288.95 ml   Constitutional: ill-appearing, no acute distress ENMT: ears and nose without scars or lesions, MMM CV: normal rate, no edema Respiratory: Bilateral chest rise, ventilated Gastrointestinal: Surgical incision present, not markedly distended Skin: no visible lesions or rashes Psych: Sedated, not interactive   Test Results I personally reviewed new and old clinical labs and radiology tests Lab Results  Component Value Date   NA 138 01/24/2024   K 3.7 01/24/2024   CL 99 01/24/2024   CO2 27 01/24/2024   BUN 35 (H) 01/24/2024   CREATININE 1.50 (H) 01/24/2024   CALCIUM 8.2 (L) 01/24/2024   ALBUMIN 1.9 (L) 01/24/2024   PHOS 5.2 (H) 01/24/2024    CBC Recent Labs  Lab 01/21/24 2248 01/21/24 2323 01/22/24 0908 01/22/24 0959 01/23/24 0417 01/23/24 1256 01/24/24 0913  WBC 16.3*  --  9.3  --  23.6*  --  19.1*  NEUTROABS 14.3*  --   --   --   --   --   --   HGB 13.3   < > 8.5*   < > 8.7*  8.8* 7.5* 8.2*  HCT  39.7   < > 25.6*   < > 26.1*  26.0* 22.0* 24.0*  MCV 107.9*  --  107.1*  --  106.5*  --  100.4*  PLT 214  --  162  --  166  --  131*   < > = values in this interval not displayed.

## 2024-01-24 NOTE — Procedures (Signed)
 Extubation Procedure Note  Patient Details:   Name: Sherri Todd DOB: 1960-08-09 MRN: [id number]   Airway Documentation:    Vent end date: 01/24/24 Vent end time: 1030   Evaluation  O2 sats: stable throughout Complications: No apparent complications Patient did tolerate procedure well. Bilateral Breath Sounds: Clear, Diminished   Extubated per MD order & placed on 4L [place]. Patient is able to speak & cough post extubation.  [name] 01/24/2024, 10:37 AM

## 2024-01-24 NOTE — Progress Notes (Addendum)
 NAME:  Sherri Todd, MRN:  [id number], DOB:  09-Mar-1960, LOS: 2 ADMISSION DATE:  01/21/2024, CONSULTATION DATE:  01/24/24 REFERRING MD:  Dasie, CHIEF COMPLAINT:  volvulus    History of Present Illness:  Sherri Todd is a 63 y.o. 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   Pertinent  Medical History   has a past medical history of Asthma, High cholesterol, Hypertension, and Stroke (HCC). Pancreas and kidney transplant  Type 1 DM  Significant Hospital Events: Including procedures, antibiotic start and stop dates in addition to other pertinent events   11/17 to OR for emergent laparotomy 11/18 return OR,  anastomosis and closure   Interim History / Subjective:   NAEO Weaned off pressors   Cr down to 1.5, hgb 8.2, WBC 19   Objective    Blood pressure 100/62, pulse 74, temperature 98 F (36.7 C), temperature source Oral, resp. rate 16, height 5' 1 (1.549 m), weight 49 kg, SpO2 99%.    Vent Mode: PRVC FiO2 (%):  [40 %] 40 % Set Rate:  [16 bmp] 16 bmp Vt Set:  [380 mL] 380 mL PEEP:  [5 cmH20] 5 cmH20 Plateau Pressure:  [14 cmH20-15 cmH20] 15 cmH20   Intake/Output Summary (Last 24 hours) at 01/24/2024 1017 Last data filed at 01/24/2024 0900 Gross per 24 hour  Intake 1188.76 ml  Output 1570 ml  Net -381.24 ml   Filed Weights   01/22/24 0550 01/23/24 0600 01/24/24 0500  Weight: 49.3 kg 51.6 kg 49 kg   Examination: General: critically and chronically ill adult F NAD intubated lightly sedated   Neuro: slightly drowsy. Awakens and follows commands. Answering yes no questions. Trying to talk, no appreciable focal deficit   HENT: NCAT pink mm ETT secure  Lungs: mechanically ventilated, symmetrical chest expansion, clear  Cardiovascular: rrr s1s2 cap refill < 3 sec  Abdomen: abd is soft slightly distended, and surgical dressing is intact.  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 DM coordinator consult placed  AKI  S/p kidney pancreas txp  -now s/p panc explant Chronic immunosuppression  P -nephro following, appreciate recs -SL tacro and solucortef for now -holding cellcept (and home pred)  -cont foley, follow renal indices and uop  Hx HTN Hx HLD  P -holding home meds   Labs   CBC: Recent Labs  Lab 01/21/24 2248 01/21/24 2323 01/22/24 0908 01/22/24 0959 01/23/24 0417 01/23/24 1256 01/24/24 0913  WBC 16.3*  --  9.3  --  23.6*  --  19.1*  NEUTROABS 14.3*  --   --   --   --   --   --   HGB 13.3   < > 8.5* 7.5* 8.7*  8.8* 7.5* 8.2*  HCT 39.7   < > 25.6* 22.0* 26.1*  26.0* 22.0* 24.0*  MCV 107.9*  --  107.1*  --  106.5*  --  100.4*  PLT 214  --  162  --  166  --  131*   < > = values in this interval not  displayed.    Basic Metabolic Panel: Recent Labs  Lab 01/21/24 2248 01/21/24 2323 01/22/24 0447 01/22/24 0630 01/22/24 0908 01/22/24 0959 01/23/24 0417 01/23/24 1256 01/24/24 0637  NA 135 134*   < >  --  134* 132* 135  135 134* 138  K 3.8 4.6   < >  --  3.7 3.6 4.5  4.5 3.8 3.7  CL 100 99  --   --  98  --  99  --  99  CO2 20*  --   --   --  21*  --  24  --  27  GLUCOSE 122* 119*  --   --  231*  --  87  --  120*  BUN 31* 48*  --   --  30*  --  32*  --  35*  CREATININE 2.20* 2.40*  --  2.72* 2.91*  --  1.96*  --  1.50*  CALCIUM  9.3  --   --   --  7.7*  --  7.3*  --  8.2*  MG  --   --   --   --   --   --  1.4*  --   --   PHOS  --   --   --   --   --   --   --   --  5.2*   < > = values in this interval not displayed.   GFR: Estimated Creatinine Clearance: 29.7 mL/min (A) (by C-G formula based on SCr of 1.5 mg/dL (H)). Recent Labs  Lab 01/21/24 2248 01/21/24 2326 01/22/24 0211 01/22/24 0908 01/23/24 0417 01/24/24 0913  WBC 16.3*  --   --  9.3 23.6* 19.1*  LATICACIDVEN  --  4.1* 4.6*  --   --   --     Liver Function Tests: Recent Labs  Lab 01/21/24 2248 01/22/24 0908 01/24/24 0637  AST 36 36  --   ALT 15 14  --   ALKPHOS 44 28*  --   BILITOT 0.8 1.0  --   PROT 6.2* 4.1*  --   ALBUMIN  3.1* 2.2* 1.9*   No results for input(s): LIPASE, AMYLASE in the last 168 hours. No results for input(s): AMMONIA in the last 168 hours.  ABG    Component Value Date/Time   PHART 7.391 01/23/2024 1256   PCO2ART 38.7 01/23/2024 1256   PO2ART 306 (H) 01/23/2024 1256   HCO3 23.5 01/23/2024 1256   TCO2 25 01/23/2024 1256   ACIDBASEDEF 1.0 01/23/2024 1256   O2SAT 100 01/23/2024 1256     Coagulation Profile: Recent Labs  Lab 01/21/24 2248  INR 1.3*    Cardiac Enzymes: No results for  input(s): CKTOTAL, CKMB, CKMBINDEX, TROPONINI in the last 168 hours.  HbA1C: Hgb A1c MFr Bld  Date/Time Value Ref Range Status  01/22/2024 09:08 AM 5.0 4.8 - 5.6 % Final     Comment:    (NOTE) Diagnosis of Diabetes The following HbA1c ranges recommended by the American Diabetes Association (ADA) may be used as an aid in the diagnosis of diabetes mellitus.  Hemoglobin             Suggested A1C NGSP%              Diagnosis  <5.7                   Non Diabetic  5.7-6.4                Pre-Diabetic  >6.4                   Diabetic  <7.0                   Glycemic control for                       adults with diabetes.      CBG: Recent Labs  Lab 01/23/24 1557 01/23/24 2021 01/24/24 0010 01/24/24 0403 01/24/24 0752  GLUCAP 95 114* 194* 145* 92    CRITICAL CARE Performed by: Ronnald FORBES Gave   Total critical care time: 40 minutes  Critical care time was exclusive of separately billable procedures and treating other patients. Critical care was necessary to treat or prevent imminent or life-threatening deterioration.  Critical care was time spent personally by me on the following activities: development of treatment plan with patient and/or surrogate as well as nursing, discussions with consultants, evaluation of patient's response to treatment, examination of patient, obtaining history from patient or surrogate, ordering and performing treatments and interventions, ordering and review of laboratory studies, ordering and review of radiographic studies, pulse oximetry and re-evaluation of patient's condition.  Ronnald Gave MSN, AGACNP-BC [facility] Amion for pager 01/24/2024, 10:17 AM

## 2024-01-24 NOTE — Plan of Care (Signed)
 Problem: Education: Goal: Knowledge of General Education information will improve Description: Including pain rating scale, medication(s)/side effects and non-pharmacologic comfort measures 01/24/2024 1851 by Eloyse Causey, Hart MATSU, RN Outcome: Progressing 01/24/2024 1851 by Itxel Wickard, Hart MATSU, RN Outcome: Progressing   Problem: Health Behavior/Discharge Planning: Goal: Ability to manage health-related needs will improve 01/24/2024 1851 by Nabilah Davoli, Hart MATSU, RN Outcome: Progressing 01/24/2024 1851 by Shellee Streng, Hart MATSU, RN Outcome: Progressing   Problem: Clinical Measurements: Goal: Ability to maintain clinical measurements within normal limits will improve 01/24/2024 1851 by Faydra Korman, Hart MATSU, RN Outcome: Progressing 01/24/2024 1851 by Icie Kuznicki, Hart MATSU, RN Outcome: Progressing Goal: Will remain free from infection 01/24/2024 1851 by Marnae Madani, Hart MATSU, RN Outcome: Progressing 01/24/2024 1851 by Asuzena Weis, Hart MATSU, RN Outcome: Progressing Goal: Diagnostic test results will improve 01/24/2024 1851 by Rickeya Manus, Hart MATSU, RN Outcome: Progressing 01/24/2024 1851 by Yassine Brunsman, Hart MATSU, RN Outcome: Progressing Goal: Respiratory complications will improve 01/24/2024 1851 by Quantez Schnyder, Hart MATSU, RN Outcome: Progressing 01/24/2024 1851 by Thaxton Pelley, Hart MATSU, RN Outcome: Progressing Goal: Cardiovascular complication will be avoided 01/24/2024 1851 by Breiona Couvillon, Hart MATSU, RN Outcome: Progressing 01/24/2024 1851 by Ariyannah Pauling, Hart MATSU, RN Outcome: Progressing   Problem: Activity: Goal: Risk for activity intolerance will decrease 01/24/2024 1851 by Cherye Gaertner, Hart MATSU, RN Outcome: Progressing 01/24/2024 1851 by Alexzandra Bilton, Hart MATSU, RN Outcome: Progressing   Problem: Nutrition: Goal: Adequate nutrition will be maintained 01/24/2024 1851 by Archer Vise, Hart MATSU, RN Outcome: Progressing 01/24/2024 1851 by Sallee Hogrefe, Hart MATSU, RN Outcome: Progressing   Problem:  Coping: Goal: Level of anxiety will decrease 01/24/2024 1851 by Raelin Pixler, Hart MATSU, RN Outcome: Progressing 01/24/2024 1851 by Cydni Reddoch, Hart MATSU, RN Outcome: Progressing   Problem: Elimination: Goal: Will not experience complications related to bowel motility 01/24/2024 1851 by Brittney Caraway, Hart MATSU, RN Outcome: Progressing 01/24/2024 1851 by Zylpha Poynor, Hart MATSU, RN Outcome: Progressing Goal: Will not experience complications related to urinary retention 01/24/2024 1851 by Jessie Cowher, Hart MATSU, RN Outcome: Progressing 01/24/2024 1851 by Orton Capell G, RN Outcome: Progressing   Problem: Pain Managment: Goal: General experience of comfort will improve and/or be controlled 01/24/2024 1851 by Caine Barfield, Hart MATSU, RN Outcome: Progressing 01/24/2024 1851 by Zylpha Poynor G, RN Outcome: Progressing   Problem: Safety: Goal: Ability to remain free from injury will improve 01/24/2024 1851 by Jamy Whyte, Hart MATSU, RN Outcome: Progressing 01/24/2024 1851 by Orvilla Truett G, RN Outcome: Progressing   Problem: Skin Integrity: Goal: Risk for impaired skin integrity will decrease 01/24/2024 1851 by Masey Scheiber, Hart MATSU, RN Outcome: Progressing 01/24/2024 1851 by Ellis Koffler G, RN Outcome: Progressing   Problem: Education: Goal: Ability to describe self-care measures that may prevent or decrease complications (Diabetes Survival Skills Education) will improve 01/24/2024 1851 by Arlo Butt, Hart MATSU, RN Outcome: Progressing 01/24/2024 1851 by Zoha Spranger, Hart MATSU, RN Outcome: Progressing Goal: Individualized Educational Video(s) 01/24/2024 1851 by Eivan Gallina, Hart MATSU, RN Outcome: Progressing 01/24/2024 1851 by Shahiem Bedwell, Hart MATSU, RN Outcome: Progressing   Problem: Coping: Goal: Ability to adjust to condition or change in health will improve Outcome: Progressing   Problem: Fluid Volume: Goal: Ability to maintain a balanced intake and output will  improve 01/24/2024 1851 by Mirriam Vadala, Hart MATSU, RN Outcome: Progressing 01/24/2024 1851 by Reid Regas, Hart MATSU, RN Outcome: Progressing   Problem: Health Behavior/Discharge Planning: Goal: Ability to identify and utilize available resources and services will improve 01/24/2024 1851 by Shylo Zamor, Hart MATSU, RN Outcome: Progressing 01/24/2024 1851 by Alixander Rallis, Hart MATSU, RN Outcome: Progressing Goal: Ability to manage health-related needs will improve 01/24/2024 1851  by Aubrianna Orchard G, RN Outcome: Progressing 01/24/2024 1851 by Cataleah Stites, Hart MATSU, RN Outcome: Progressing   Problem: Metabolic: Goal: Ability to maintain appropriate glucose levels will improve Outcome: Progressing   Problem: Nutritional: Goal: Maintenance of adequate nutrition will improve 01/24/2024 1851 by Kania Regnier, Hart MATSU, RN Outcome: Progressing 01/24/2024 1851 by Iven Earnhart, Hart MATSU, RN Outcome: Progressing Goal: Progress toward achieving an optimal weight will improve 01/24/2024 1851 by Morrill Bomkamp, Hart MATSU, RN Outcome: Progressing 01/24/2024 1851 by Jazline Cumbee G, RN Outcome: Progressing   Problem: Skin Integrity: Goal: Risk for impaired skin integrity will decrease 01/24/2024 1851 by Eleaner Dibartolo, Hart MATSU, RN Outcome: Progressing 01/24/2024 1851 by Geordie Nooney, Hart MATSU, RN Outcome: Progressing   Problem: Tissue Perfusion: Goal: Adequacy of tissue perfusion will improve 01/24/2024 1851 by Brier Firebaugh, Hart MATSU, RN Outcome: Progressing 01/24/2024 1851 by Kaisyn Reinhold G, RN Outcome: Progressing   Problem: Role Relationship: Goal: Method of communication will improve 01/24/2024 1851 by Coti Burd, Hart MATSU, RN Outcome: Progressing 01/24/2024 1851 by Rishikesh Khachatryan, Hart MATSU, RN Outcome: Progressing

## 2024-01-25 ENCOUNTER — Other Ambulatory Visit (HOSPITAL_COMMUNITY): Payer: Self-pay

## 2024-01-25 DIAGNOSIS — D696 Thrombocytopenia, unspecified: Secondary | ICD-10-CM

## 2024-01-25 DIAGNOSIS — D62 Acute posthemorrhagic anemia: Secondary | ICD-10-CM

## 2024-01-25 DIAGNOSIS — E1022 Type 1 diabetes mellitus with diabetic chronic kidney disease: Secondary | ICD-10-CM

## 2024-01-25 DIAGNOSIS — G9340 Encephalopathy, unspecified: Secondary | ICD-10-CM

## 2024-01-25 LAB — RENAL FUNCTION PANEL
Albumin: 1.8 g/dL — ABNORMAL LOW (ref 3.5–5.0)
Anion gap: 13 (ref 5–15)
BUN: 43 mg/dL — ABNORMAL HIGH (ref 8–23)
CO2: 25 mmol/L (ref 22–32)
Calcium: 8.3 mg/dL — ABNORMAL LOW (ref 8.9–10.3)
Chloride: 104 mmol/L (ref 98–111)
Creatinine, Ser: 1.4 mg/dL — ABNORMAL HIGH (ref 0.44–1.00)
GFR, Estimated: 43 mL/min — ABNORMAL LOW (ref >60)
Glucose, Bld: 126 mg/dL — ABNORMAL HIGH (ref 70–99)
Phosphorus: 3.7 mg/dL (ref 2.5–4.6)
Potassium: 3.7 mmol/L (ref 3.5–5.1)
Sodium: 142 mmol/L (ref 135–145)

## 2024-01-25 LAB — SURGICAL PATHOLOGY

## 2024-01-25 LAB — CBC
HCT: 24.8 % — ABNORMAL LOW (ref 36.0–46.0)
Hemoglobin: 8.5 g/dL — ABNORMAL LOW (ref 12.0–15.0)
MCH: 35 pg — ABNORMAL HIGH (ref 26.0–34.0)
MCHC: 34.3 g/dL (ref 30.0–36.0)
MCV: 102.1 fL — ABNORMAL HIGH (ref 80.0–100.0)
Platelets: 131 K/uL — ABNORMAL LOW (ref 150–400)
RBC: 2.43 MIL/uL — ABNORMAL LOW (ref 3.87–5.11)
RDW: 17.4 % — ABNORMAL HIGH (ref 11.5–15.5)
WBC: 20.5 K/uL — ABNORMAL HIGH (ref 4.0–10.5)
nRBC: 0.1 % (ref 0.0–0.2)

## 2024-01-25 LAB — GLUCOSE, CAPILLARY
Glucose-Capillary: 179 mg/dL — ABNORMAL HIGH (ref 70–99)
Glucose-Capillary: 186 mg/dL — ABNORMAL HIGH (ref 70–99)
Glucose-Capillary: 204 mg/dL — ABNORMAL HIGH (ref 70–99)
Glucose-Capillary: 227 mg/dL — ABNORMAL HIGH (ref 70–99)
Glucose-Capillary: 267 mg/dL — ABNORMAL HIGH (ref 70–99)
Glucose-Capillary: 290 mg/dL — ABNORMAL HIGH (ref 70–99)
Glucose-Capillary: 99 mg/dL (ref 70–99)

## 2024-01-25 LAB — MAGNESIUM: Magnesium: 2.9 mg/dL — ABNORMAL HIGH (ref 1.7–2.4)

## 2024-01-25 MED ORDER — LABETALOL HCL 5 MG/ML IV SOLN
10.0000 mg | INTRAVENOUS | Status: DC | PRN
  Administered 2024-01-25: 10 mg via INTRAVENOUS

## 2024-01-25 MED ORDER — HYDROCORTISONE SOD SUC (PF) 100 MG IJ SOLR: 50.0000 mg | Freq: Two times a day (BID) | INTRAMUSCULAR | Status: DC

## 2024-01-25 MED ORDER — HYDROCORTISONE SOD SUC (PF) 100 MG IJ SOLR
100.0000 mg | Freq: Three times a day (TID) | INTRAMUSCULAR | Status: DC
  Administered 2024-01-25 – 2024-01-26 (×4): 100 mg via INTRAVENOUS
  Filled 2024-01-25 (×4): qty 2

## 2024-01-25 MED ORDER — LABETALOL HCL 5 MG/ML IV SOLN
10.0000 mg | INTRAVENOUS | Status: DC | PRN
  Administered 2024-01-25: 10 mg via INTRAVENOUS
  Filled 2024-01-25: qty 4

## 2024-01-25 MED ORDER — HYDRALAZINE HCL 20 MG/ML IJ SOLN
10.0000 mg | Freq: Four times a day (QID) | INTRAMUSCULAR | Status: DC | PRN
  Filled 2024-01-25: qty 1

## 2024-01-25 MED ORDER — ALBUMIN HUMAN 25 % IV SOLN
25.0000 g | Freq: Once | INTRAVENOUS | Status: AC
  Administered 2024-01-25: 12.5 g via INTRAVENOUS
  Filled 2024-01-25: qty 100

## 2024-01-25 MED ORDER — FLUOXETINE HCL 20 MG PO CAPS
20.0000 mg | ORAL_CAPSULE | Freq: Every day | ORAL | Status: DC
  Administered 2024-01-25: 20 mg via ORAL
  Filled 2024-01-25 (×2): qty 1

## 2024-01-25 MED ORDER — MELATONIN 3 MG PO TABS
3.0000 mg | ORAL_TABLET | Freq: Every day | ORAL | Status: DC
  Administered 2024-01-25: 3 mg via ORAL
  Filled 2024-01-25: qty 1

## 2024-01-25 NOTE — Progress Notes (Signed)
 NAME:  Sherri Todd, MRN:  [id number], DOB:  04-Jan-1961, LOS: 3 ADMISSION DATE:  01/21/2024, CONSULTATION DATE:  01/25/24 REFERRING MD:  Dasie, CHIEF COMPLAINT:  volvulus    History of Present Illness:  Sherri Todd is a 63 y.o. 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   Pertinent  Medical History   has a past medical history of Asthma, High cholesterol, Hypertension, and Stroke (HCC). Pancreas and kidney transplant  Type 1 DM  Significant Hospital Events: Including procedures, antibiotic start and stop dates in addition to other pertinent events   11/17 to OR for emergent laparotomy 11/18 return OR,  anastomosis and closure  11/19 extubated   Interim History / Subjective:  Off pressors  Aline positional  Some intermittent confusion  C/o abd pain but improved with dilaudid   Objective    Blood pressure (!) 149/77, pulse 84, temperature 98.4 F (36.9 C), temperature source Oral, resp. rate (!) 22, height 5' 1 (1.549 m), weight 49 kg, SpO2 95%.        Intake/Output Summary (Last 24 hours) at 01/25/2024 0942 Last data filed at 01/25/2024 0907 Gross per 24 hour  Intake 1603.92 ml  Output 1535 ml  Net 68.92 ml   Filed Weights   01/22/24 0550 01/23/24 0600 01/24/24 0500  Weight: 49.3 kg 51.6 kg 49 kg   Examination: General:  chronically ill appearing female, NAD  HEENT: MM pink/moist Neuro: awake, alert, mostly appropriate but some intermittent confusion,  oriented, but some odd statements,  MAE CV: s1s2 rrr, no m/r/g PULM:  resps even non labored on [employer], diminishes bases  GI: soft, slight distention, dressing c/d  Extremities: warm/dry, no sig edema  Skin: no rashes or lesions  Resolved problem list   Assessment and Plan   Septic shock 2/2 purulent peritonitis, bowel necrosis, 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 -- shock resolved, off pressors  S/p bowel resection, explant of transplanted pancreas, vac  S/p anastomosis & abd closure (fascia)  -11/17 resection of distal ileum, ascending colon, explant of pancreas, left open and in discontinuity -11/18 anastomosis, abd closed  P -post op per [facility] -cont zosyn  - sips of clears ok -keep NGT  -d/c aline  -consider wean stress dose steroids - chronic pred  - insulin needs as below  - trend WBC   DM1 with hyperglycemia, s/p pancreatectomy of transplanted pancreas -glargine 5 units daily -SSI PRN -will be a new start on insulin again at discharge -DM coordinator consulted   AKI - improving  S/p kidney pancreas txp  -now s/p panc explant Chronic immunosuppression  P -nephro following, appreciate recs -SL tacro and solucortef for now -holding cellcept (and home pred)  -cont foley, follow renal indices and uop   - f/u chem   Hx HTN Hx HLD  P -holding home meds   Husband updated at bedside 11/20, also called daughter and left VM at pt's request.   Labs   CBC: Recent Labs  Lab 01/21/24 2248 01/21/24 2323 01/22/24 0908 01/22/24  9040 01/23/24 0417 01/23/24 1256 01/24/24 0913 01/25/24 0458  WBC 16.3*  --  9.3  --  23.6*  --  19.1* 20.5*  NEUTROABS 14.3*  --   --   --   --   --   --   --   HGB 13.3   < > 8.5* 7.5* 8.7*  8.8* 7.5* 8.2* 8.5*  HCT 39.7   < > 25.6* 22.0* 26.1*  26.0* 22.0* 24.0* 24.8*  MCV 107.9*  --  107.1*  --  106.5*  --  100.4* 102.1*  PLT 214  --  162  --  166  --  131* 131*   < > = values in this interval not displayed.    Basic Metabolic Panel: Recent Labs  Lab  01/21/24 2248 01/21/24 2323 01/22/24 0447 01/22/24 0630 01/22/24 0908 01/22/24 0959 01/23/24 0417 01/23/24 1256 01/24/24 0637 01/25/24 0458  NA 135 134*   < >  --  134* 132* 135  135 134* 138 142  K 3.8 4.6   < >  --  3.7 3.6 4.5  4.5 3.8 3.7 3.7  CL 100 99  --   --  98  --  99  --  99 104  CO2 20*  --   --   --  21*  --  24  --  27 25  GLUCOSE 122* 119*  --   --  231*  --  87  --  120* 126*  BUN 31* 48*  --   --  30*  --  32*  --  35* 43*  CREATININE 2.20* 2.40*  --  2.72* 2.91*  --  1.96*  --  1.50* 1.40*  CALCIUM  9.3  --   --   --  7.7*  --  7.3*  --  8.2* 8.3*  MG  --   --   --   --   --   --  1.4*  --   --  2.9*  PHOS  --   --   --   --   --   --   --   --  5.2* 3.7   < > = values in this interval not displayed.   GFR: Estimated Creatinine Clearance: 31.8 mL/min (A) (by C-G formula based on SCr of 1.4 mg/dL (H)). Recent Labs  Lab 01/21/24 2326 01/22/24 0211 01/22/24 0908 01/23/24 0417 01/24/24 0913 01/25/24 0458  WBC  --   --  9.3 23.6* 19.1* 20.5*  LATICACIDVEN 4.1* 4.6*  --   --   --   --     Liver Function Tests: Recent Labs  Lab 01/21/24 2248 01/22/24 0908 01/24/24 0637 01/25/24 0458  AST 36 36  --   --   ALT 15 14  --   --   ALKPHOS 44 28*  --   --   BILITOT 0.8 1.0  --   --   PROT 6.2* 4.1*  --   --   ALBUMIN  3.1* 2.2* 1.9* 1.8*   No results for input(s): LIPASE, AMYLASE in the last 168 hours. Recent Labs  Lab 01/24/24 1004  AMMONIA 16    ABG    Component Value Date/Time   PHART 7.391 01/23/2024 1256   PCO2ART 38.7 01/23/2024 1256   PO2ART 306 (H) 01/23/2024 1256   HCO3 23.5 01/23/2024 1256   TCO2 25 01/23/2024 1256   ACIDBASEDEF 1.0 01/23/2024 1256   O2SAT 100 01/23/2024 1256     Coagulation Profile: Recent Labs  Lab 01/21/24 2248  INR 1.3*    Cardiac Enzymes: No results for input(s): CKTOTAL, CKMB, CKMBINDEX, TROPONINI in the last 168 hours.  HbA1C: Hgb A1c MFr Bld  Date/Time Value Ref Range Status   01/22/2024 09:08 AM 5.0 4.8 - 5.6 % Final    Comment:    (NOTE) Diagnosis of Diabetes The following HbA1c ranges recommended by the American Diabetes Association (ADA) may be used as an aid in the diagnosis of diabetes mellitus.  Hemoglobin             Suggested A1C NGSP%              Diagnosis  <5.7                   Non Diabetic  5.7-6.4                Pre-Diabetic  >6.4                   Diabetic  <7.0                   Glycemic control for                       adults with diabetes.      CBG: Recent Labs  Lab 01/24/24 1646 01/24/24 1934 01/25/24 0002 01/25/24 0348 01/25/24 0745  GLUCAP 135* 102* 204* 99 186*     Rockie Myers, NP Pulmonary/Critical Care Medicine  01/25/2024  9:42 AM   See Amion for personal pager PCCM on call pager [phone] until 7pm. Please call Elink 7p-7a. [phone]

## 2024-01-25 NOTE — Evaluation (Signed)
 Physical Therapy Evaluation Patient Details Name: Sherri Todd MRN: [id number] DOB: 07-Nov-1960 Today's Date: 01/25/2024  History of Present Illness  Pt is a 63 y.o female presented to ED 11/17 for R sided abdominal pain. CT showed small bowel obstruction. Intubated 11/17 for S/p ex lap, resection of distal ileum and ascending colon, explant of transplanted pancreas, temporary closure. 11/18 S/p reopening of prior laparotomy,  ileocolonic anastamosis at proximal transverse colon, suture repair upon closure of umbilical hernia. Extubated 11/19. PMH:  kidney-pancreas transplant 1999, HTN, CVA, DM  Clinical Impression  Pt is currently presenting at  Mod A +2 for bed mobility, sit to stand and transfers with a posterior lean. Pt is very anxious about mobility in anticipation of pain with heavy reliance on pillow for support at abdomen during transfers. Pt was very active and ind prior to hospitalization. Spouse is present and support. Due to pt current functional status, home set up and available assistance at home recommending skilled physical therapy services > 3 hours/day in order to address strength, balance and functional mobility to decrease risk for falls, injury, immobility, skin break down and re-hospitalization.         If plan is discharge home, recommend the following: Assist for transportation;Assistance with cooking/housework;Help with stairs or ramp for entrance;A little help with walking and/or transfers     Equipment Recommendations BSC/3in1;Rolling walker (2 wheels)  Recommendations for Other Services  Rehab consult    Functional Status Assessment Patient has had a recent decline in their functional status and demonstrates the ability to make significant improvements in function in a reasonable and predictable amount of time.     Precautions / Restrictions Precautions Precautions: Fall Recall of Precautions/Restrictions: Impaired Precaution/Restrictions Comments: Abdominal  for comfort      Mobility  Bed Mobility Overal bed mobility: Needs Assistance Bed Mobility: Supine to Sit     Supine to sit: Min assist, +2 for physical assistance     General bed mobility comments: Min A for trunk to midline and +2 for safety/comfort    Transfers Overall transfer level: Needs assistance Equipment used: 2 person hand held assist Transfers: Sit to/from Stand, Bed to chair/wheelchair/BSC Sit to Stand: Mod assist, +2 physical assistance, +2 safety/equipment   Step pivot transfers: Min assist, +2 physical assistance, +2 safety/equipment       General transfer comment: HH assist d/t pt holding pillow for pain. Steps with 2 person mod A and posterior lean from EOB to recliner.    Ambulation/Gait     Pre-gait activities: pt taking steps from EOB to recliner with 2 person Mod A at [employer] with assist for balance and LE weakness.       Balance Overall balance assessment: Needs assistance Sitting-balance support: Bilateral upper extremity supported, Feet supported Sitting balance-Leahy Scale: Poor Sitting balance - Comments: Close CGA   Standing balance support: No upper extremity supported, During functional activity Standing balance-Leahy Scale: Poor Standing balance comment: Would benefit from UE support       Pertinent Vitals/Pain Pain Assessment Pain Assessment: 0-10 Pain Score: 5  Pain Location: Abdomen Pain Descriptors / Indicators: Discomfort, Grimacing Pain Intervention(s): Limited activity within patient's tolerance, Monitored during session, Repositioned    Home Living Family/patient expects to be discharged to:: Private residence Living Arrangements: Spouse/significant other Available Help at Discharge: Family;Available 24 hours/day Type of Home: House Home Access: Stairs to enter Entrance Stairs-Rails: Right;Left Entrance Stairs-Number of Steps: 2   Home Layout: One level;Laundry or work area in [employer]:  Rolling Walker  (2 wheels)      Prior Function Prior Level of Function : Independent/Modified Independent             Mobility Comments: Ind, no AD ADLs Comments: Ind     Extremity/Trunk Assessment   Upper Extremity Assessment Upper Extremity Assessment: Defer to OT evaluation    Lower Extremity Assessment Lower Extremity Assessment: Generalized weakness    Cervical / Trunk Assessment Cervical / Trunk Assessment: Normal  Communication   Communication Communication: No apparent difficulties    Cognition Arousal: Alert Behavior During Therapy: Anxious   PT - Cognitive impairments: No apparent impairments       Following commands: Intact       Cueing Cueing Techniques: Verbal cues     General Comments General comments (skin integrity, edema, etc.): spouse present and supportive during session. vital signs stable on 3L o2 via [place]        Assessment/Plan    PT Assessment Patient needs continued PT services  PT Problem List Decreased strength;Decreased activity tolerance;Decreased balance;Decreased mobility;Pain       PT Treatment Interventions DME instruction;Balance training;Gait training;Stair training;Patient/family education;Functional mobility training;Therapeutic activities;Therapeutic exercise    PT Goals (Current goals can be found in the Care Plan section)  Acute Rehab PT Goals Patient Stated Goal: to improve mobility and decrease pain PT Goal Formulation: With patient Time For Goal Achievement: 02/08/24 Potential to Achieve Goals: Good    Frequency Min 3X/week     Co-evaluation PT/OT/SLP Co-Evaluation/Treatment: Yes Reason for Co-Treatment: Complexity of the patient's impairments (multi-system involvement);For patient/therapist safety;To address functional/ADL transfers PT goals addressed during session: Mobility/safety with mobility;Balance OT goals addressed during session: ADL's and self-care       AM-PAC PT 6 Clicks Mobility  Outcome Measure  Help needed turning from your back to your side while in a flat bed without using bedrails?: A Lot Help needed moving from lying on your back to sitting on the side of a flat bed without using bedrails?: A Lot Help needed moving to and from a bed to a chair (including a wheelchair)?: Total Help needed standing up from a chair using your arms (e.g., wheelchair or bedside chair)?: Total Help needed to walk in hospital room?: Total Help needed climbing 3-5 steps with a railing? : Total 6 Click Score: 8    End of Session Equipment Utilized During Treatment: Oxygen Activity Tolerance: Patient tolerated treatment well Patient left: in chair;with call bell/phone within reach;with family/visitor present Nurse Communication: Mobility status PT Visit Diagnosis: Unsteadiness on feet (R26.81);Other abnormalities of gait and mobility (R26.89);Muscle weakness (generalized) (M62.81);Pain Pain - part of body:  (abdomen)    Time: 0925-1000 PT Time Calculation (min) (ACUTE ONLY): 35 min   Charges:   PT Evaluation $PT Eval Low Complexity: 1 Low   PT General Charges $$ ACUTE PT VISIT: 1 Visit         Dorothyann Maier, DPT, CLT  Acute Rehabilitation Services Office: [phone] (Secure chat preferred)   Dorothyann VEAR Maier 01/25/2024, 12:26 PM

## 2024-01-25 NOTE — Progress Notes (Signed)
   01/25/24 1811  Vitals  Temp 97.6 F (36.4 C)  Temp Source Oral  BP 133/77  MAP (mmHg) 94  BP Location Right Arm  BP Method Automatic  Patient Position (if appropriate) Lying  Pulse Rate 85  Pulse Rate Source Monitor  ECG Heart Rate 85  Resp 17  MEWS COLOR  MEWS Score Color Green  Oxygen Therapy  SpO2 94 %  O2 Device Room Air  MEWS Score  MEWS Temp 0  MEWS Systolic 0  MEWS Pulse 0  MEWS RR 0  MEWS LOC 0  MEWS Score 0   Pt arrived on floor from 2H. Pt has a foley catheter and cortrak to R nare. Foley and peri care performed. CHG bath performed. CCMD notified of new admission. Pt in no apparent distress. Stated her pain was a 5/10, but then quickly fell back asleep. Skin assessment performed. Pt alert and oriented only to self. IV's flushed. Pt resting comfortably.

## 2024-01-25 NOTE — Progress Notes (Signed)
 Nephrology Follow-Up Consult note   Assessment/Recommendations: Sherri Todd is a/an 63 y.o. 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  Likely secondary to shock with some degree of tubular injury. Improving -Continue with supportive care including pressors as needed -IVFs PRN -25 g albumin today -Continue to monitor daily Cr, Dose meds for GFR -Monitor Daily I/Os, Daily weight  -Maintain MAP>65 for optimal renal perfusion.  -Avoid nephrotoxic medications including NSAIDs -Use synthetic opioids (Fentanyl/Dilaudid) if needed   Severe septic shock: Associated with ischemic bowel.  Status post surgery as below.  Continue pressors and antibiotics per primary team    Ischemic bowel: Associated with volvulus.  Also with ischemic pancreas.  Status postsurgical resection.  Surgery following   History of kidney/pancreas transplant: In 1999.  Followed by [facility].  Hold home mycophenolate given critical illness and restart as she stabilizes.  Continue tacrolimus  as sublingual and f/u trough.  Switch to tablet formulation when taking p.o.  Receiving stress dose steroids because of hypotension. Switch to pred 5mg  equivalent when able. Restart MMF when tolerating p.o.   Blood loss anemia: Associated with surgery.  Transfuse as needed   Recommendations conveyed to primary service.    [facility] 01/25/2024 10:28 AM  ___________________________________________________________  CC: Nausea and vomiting  Interval History/Subjective: Patient extubated.  More awake and alert.  States that she feels terrible but has a hard time relating exactly what is bothering her.  Urine output continues to be fair.  Creatinine slowly  improving.  Medications:  Current Facility-Administered Medications  Medication Dose Route Frequency Provider Last Rate Last Admin   0.9 %  sodium chloride  infusion   Intravenous PRN Vicci Sor R, PA-C       0.9 %  sodium chloride  infusion   Intravenous PRN Johnson, Kelly R, PA-C   Stopped at 01/23/24 1225   0.9 %  sodium chloride  infusion   Intravenous PRN Johnson, Kelly R, PA-C   Stopped at 01/23/24 1224   acetaminophen  (TYLENOL ) 160 MG/5ML solution 650 mg  650 mg Per Tube Q6H Vicci Sor R, PA-C   650 mg at 01/25/24 0354   albuterol (PROVENTIL) (2.5 MG/3ML) 0.083% nebulizer solution 2.5 mg  2.5 mg Nebulization Q4H PRN Johnson, Kelly R, PA-C       Chlorhexidine Gluconate Cloth 2 % PADS 6 each  6 each Topical Daily Johnson, Kelly R, PA-C   6 each at 01/24/24 1100   feeding supplement (BOOST / RESOURCE BREEZE) liquid 1 Container  1 Container Oral TID BM Gretta Leita SQUIBB, DO       FLUoxetine (PROZAC) capsule 20 mg  20 mg Oral Daily Whiteheart, Kathryn A, NP       heparin injection 5,000 Units  5,000 Units Subcutaneous Q8H Vicci Sor SAUNDERS, PA-C   5,000 Units at 01/25/24 9451   hydrALAZINE (APRESOLINE) injection 10 mg  10 mg Intravenous Q6H PRN Gretta Leita P, DO       hydrocortisone sodium succinate (SOLU-CORTEF) 100 MG injection 100 mg  100 mg Intravenous Q8H Whiteheart, Kathryn A, NP       HYDROmorphone (DILAUDID) injection 0.5 mg  0.5 mg Intravenous Q2H PRN Johnson, Kelly R, PA-C       insulin aspart (novoLOG) injection 0-15 Units  0-15 Units Subcutaneous Q4H  Daren Ronnald BRAVO, NP   3 Units at 01/25/24 0900   insulin glargine-yfgn [facility]) injection 5 Units  5 Units Subcutaneous QHS Vicci Burnard SAUNDERS, PA-C   5 Units at 01/24/24 2151   labetalol (NORMODYNE) injection 10 mg  10 mg Intravenous Q2H PRN Gretta Doffing P, DO       melatonin tablet 3 mg  3 mg Oral QHS Whiteheart, Lamarr LABOR, NP       mupirocin ointment (BACTROBAN) 2 % 1 Application  1 Application Nasal BID Vicci Burnard SAUNDERS, PA-C   1  Application at 01/24/24 2151   Oral care mouth rinse  15 mL Mouth Rinse PRN Vicci Burnard SAUNDERS, PA-C       Oral care mouth rinse  15 mL Mouth Rinse PRN Gretta Doffing SQUIBB, DO       oxyCODONE (ROXICODONE) 5 MG/5ML solution 5-10 mg  5-10 mg Per Tube Q4H PRN Vicci Burnard SAUNDERS, PA-C   5 mg at 01/24/24 2321   pantoprazole (PROTONIX) injection 40 mg  40 mg Intravenous Daily Vicci Burnard SAUNDERS, PA-C   40 mg at 01/24/24 0943   piperacillin-tazobactam (ZOSYN) IVPB 3.375 g  3.375 g Intravenous Q8H Alveria Modest, RPH 12.5 mL/hr at 01/25/24 9092 Infusion Verify at 01/25/24 9092   tacrolimus  (PROGRAF ) capsule 0.5 mg  0.5 mg Sublingual BID Vicci Burnard SAUNDERS, PA-C   0.5 mg at 01/24/24 2155      Review of Systems: 12 systems reviewed and negative except per HPI  Physical Exam: Vitals:   01/25/24 0800 01/25/24 0900  BP: (!) 150/77 (!) 149/77  Pulse: 82 84  Resp: (!) 23 (!) 22  Temp:    SpO2: 95% 95%   Total I/O In: 26.6 [IV Piggyback:26.6] Out: 90 [Urine:90]  Intake/Output Summary (Last 24 hours) at 01/25/2024 1028 Last data filed at 01/25/2024 0907 Gross per 24 hour  Intake 1603.92 ml  Output 1485 ml  Net 118.92 ml   Constitutional: ill-appearing, no acute distress ENMT: ears and nose without scars or lesions, MMM CV: normal rate, no edema Respiratory: Bilateral chest rise, no increased work of breathing Gastrointestinal: Surgical incision present, not markedly distended Skin: no visible lesions or rashes Psych: Awake and alert, minimally interactive   Test Results I personally reviewed new and old clinical labs and radiology tests Lab Results  Component Value Date   NA 142 01/25/2024   K 3.7 01/25/2024   CL 104 01/25/2024   CO2 25 01/25/2024   BUN 43 (H) 01/25/2024   CREATININE 1.40 (H) 01/25/2024   CALCIUM 8.3 (L) 01/25/2024   ALBUMIN 1.8 (L) 01/25/2024   PHOS 3.7 01/25/2024    CBC Recent Labs  Lab 01/21/24 2248 01/21/24 2323 01/23/24 0417 01/23/24 1256 01/24/24 0913  01/25/24 0458  WBC 16.3*   < > 23.6*  --  19.1* 20.5*  NEUTROABS 14.3*  --   --   --   --   --   HGB 13.3   < > 8.7*  8.8* 7.5* 8.2* 8.5*  HCT 39.7   < > 26.1*  26.0* 22.0* 24.0* 24.8*  MCV 107.9*   < > 106.5*  --  100.4* 102.1*  PLT 214   < > 166  --  131* 131*   < > = values in this interval not displayed.

## 2024-01-25 NOTE — Evaluation (Signed)
 Occupational Therapy Evaluation Patient Details Name: Sherri Todd MRN: [id number] DOB: 1960/06/29 Today's Date: 01/25/2024   History of Present Illness   Pt is a 63 y.o female presented to ED 11/17 for R sided abdominal pain. CT showed small bowel obstruction. Intubated 11/17 for S/p ex lap, resection of distal ileum and ascending colon, explant of transplanted pancreas, temporary closure. 11/18 S/p reopening of prior laparotomy,  ileocolonic anastamosis at proximal transverse colon, suture repair upon closure of umbilical hernia. Extubated 11/19. PMH:  kidney-pancreas transplant 1999, HTN, CVA, DM     Clinical Impressions Pt admitted based on above, and was seen based on problem list below. PTA pt was independent with ADLs and IADLs. Today pt is requiring set up  to mod assist for ADLs. Bed mobility and functional transfers are  min to mod +2 assist. Pt limited by abdominal paind, holding pillow throughout session. Pt with good support system available at d/c, and is eager to return to PLOF, recommending  >3 hours of skilled rehab daily. OT will continue to follow acutely to maximize functional independence.        If plan is discharge home, recommend the following:   A lot of help with walking and/or transfers;A lot of help with bathing/dressing/bathroom;Assistance with cooking/housework     Functional Status Assessment   Patient has had a recent decline in their functional status and demonstrates the ability to make significant improvements in function in a reasonable and predictable amount of time.     Equipment Recommendations   BSC/3in1      Precautions/Restrictions   Precautions Precautions: Fall Recall of Precautions/Restrictions: Impaired Precaution/Restrictions Comments: Abdominal for comfort Restrictions Weight Bearing Restrictions Per Provider Order: No     Mobility Bed Mobility Overal bed mobility: Needs Assistance Bed Mobility: Supine to Sit      Supine to sit: Min assist, +2 for physical assistance     General bed mobility comments: Pt with good ability to walk legs to EOB, min for trunk, +2 for safety    Transfers Overall transfer level: Needs assistance Equipment used: 2 person hand held assist Transfers: Sit to/from Stand, Bed to chair/wheelchair/BSC Sit to Stand: Mod assist, +2 physical assistance, +2 safety/equipment     Step pivot transfers: Min assist, +2 physical assistance, +2 safety/equipment     General transfer comment: HH assist d/t pt holding pillow for pain. Good ability to take steps for short pivot to recliner      Balance Overall balance assessment: Needs assistance Sitting-balance support: Bilateral upper extremity supported, Feet supported Sitting balance-Leahy Scale: Poor Sitting balance - Comments: Close CGA   Standing balance support: No upper extremity supported, During functional activity Standing balance-Leahy Scale: Poor Standing balance comment: Would benefit from UE support           ADL either performed or assessed with clinical judgement   ADL Overall ADL's : Needs assistance/impaired Eating/Feeding: Set up;Sitting   Grooming: Set up;Sitting   Upper Body Bathing: Set up;Sitting   Lower Body Bathing: Moderate assistance   Upper Body Dressing : Set up;Sitting   Lower Body Dressing: Moderate assistance   Toilet Transfer: Moderate assistance;+2 for physical assistance;+2 for safety/equipment;Stand-pivot           Functional mobility during ADLs: Moderate assistance;+2 for physical assistance;+2 for safety/equipment;Cueing for safety General ADL Comments: Pt limited by pain and decreased balance     Vision Baseline Vision/History: 1 Wears glasses Vision Assessment?: No apparent visual deficits  Pertinent Vitals/Pain Pain Assessment Pain Assessment: 0-10 Pain Score: 5  Pain Location: Abdomen Pain Descriptors / Indicators: Discomfort, Grimacing Pain  Intervention(s): Limited activity within patient's tolerance, Repositioned     Extremity/Trunk Assessment Upper Extremity Assessment Upper Extremity Assessment: Generalized weakness   Lower Extremity Assessment Lower Extremity Assessment: Defer to PT evaluation       Communication Communication Communication: No apparent difficulties   Cognition Arousal: Alert Behavior During Therapy: Anxious Cognition: Cognition impaired   Orientation impairments: Time   Memory impairment (select all impairments): Working Biochemist, Clinical functioning impairment (select all impairments): Problem solving OT - Cognition Comments: Pt not oriented to time, difficulty answering home set up questions. Aware of deficits and anxious regarding mobility                 Following commands: Intact       Cueing  General Comments   Cueing Techniques: Verbal cues  Spouse present for session and supportive           Home Living Family/patient expects to be discharged to:: Private residence Living Arrangements: Spouse/significant other Available Help at Discharge: Family;Available 24 hours/day Type of Home: House Home Access: Stairs to enter [employer] of Steps: 2 Entrance Stairs-Rails: Right;Left Home Layout: One level;Laundry or work area in basement     [employer] Shower/Tub: Chief Strategy Officer: Standard Bathroom Accessibility: Yes How Accessible: Accessible via walker Home Equipment: Agricultural Consultant (2 wheels)          Prior Functioning/Environment Prior Level of Function : Independent/Modified Independent             Mobility Comments: Ind, no AD ADLs Comments: Ind    OT Problem List: Decreased strength;Decreased range of motion;Decreased activity tolerance;Impaired balance (sitting and/or standing);Decreased cognition;Decreased safety awareness;Decreased knowledge of use of DME or AE;Cardiopulmonary status limiting activity;Pain   OT  Treatment/Interventions: Self-care/ADL training;Therapeutic exercise;Energy conservation;DME and/or AE instruction;Therapeutic activities;Patient/family education;Balance training      OT Goals(Current goals can be found in the care plan section)   Acute Rehab OT Goals Patient Stated Goal: To get better OT Goal Formulation: With patient Time For Goal Achievement: 02/08/24 Potential to Achieve Goals: Good   OT Frequency:  Min 2X/week    Co-evaluation PT/OT/SLP Co-Evaluation/Treatment: Yes Reason for Co-Treatment: Complexity of the patient's impairments (multi-system involvement);For patient/therapist safety;To address functional/ADL transfers   OT goals addressed during session: ADL's and self-care      AM-PAC OT 6 Clicks Daily Activity     Outcome Measure Help from another person eating meals?: None Help from another person taking care of personal grooming?: A Little Help from another person toileting, which includes using toliet, bedpan, or urinal?: Total Help from another person bathing (including washing, rinsing, drying)?: A Lot Help from another person to put on and taking off regular upper body clothing?: A Little Help from another person to put on and taking off regular lower body clothing?: A Lot 6 Click Score: 15   End of Session Equipment Utilized During Treatment: Oxygen Nurse Communication: Mobility status  Activity Tolerance: Patient limited by pain Patient left: in chair;with call bell/phone within reach;with family/visitor present  OT Visit Diagnosis: Unsteadiness on feet (R26.81);Other abnormalities of gait and mobility (R26.89);Muscle weakness (generalized) (M62.81)                Time: 0925-1000 OT Time Calculation (min): 35 min Charges:  OT General Charges $OT Visit: 1 Visit OT Evaluation $OT Eval Moderate Complexity: 1 Mod  Kazumi Lachney C, OT  Acute Rehabilitation Services Office [phone] Secure chat preferred   Adrianne GORMAN Savers 01/25/2024,  11:39 AM

## 2024-01-25 NOTE — Inpatient Diabetes Management (Signed)
 Inpatient Diabetes Program Recommendations  AACE/ADA: New Consensus Statement on Inpatient Glycemic Control   Target Ranges:  Prepandial:   less than 140 mg/dL      Peak postprandial:   less than 180 mg/dL (1-2 hours)      Critically ill patients:  140 - 180 mg/dL    Latest Reference Range & Units 01/24/24 07:52 01/24/24 11:32 01/24/24 16:46 01/24/24 19:34 01/25/24 00:02 01/25/24 03:48 01/25/24 07:45 01/25/24 11:32  Glucose-Capillary 70 - 99 mg/dL 92 91 864 (H) 897 (H) 795 (H) 99 186 (H) 227 (H)   Review of Glycemic Control  Diabetes history: DM1 (ex-transplanted pancreas) Outpatient Diabetes medications: None; had been on insulin prior to pancreas transplant in past Current orders for Inpatient glycemic control: Semglee 5 units at bedtime, Novolog 0-15 units Q4H; Solucortef 100 mg Q8H  NOTE: Spoke with patient at bedside. Patient sitting up in chair. Patient confirms that she has hx of DM1 and she had been on insulin in the past prior to pancreas transplant. Patient is aware that now that transplanted pancreas was removed, she will need to take insulin again. Patient states she has used vial/syringe in the past and would like to learn how to use the insulin pen. 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  Briefly discussed the insulin pen and patient states that she is confused intermittently and she would like team to follow up with her later regarding the insulin pens and CGM.  Will have team follow up with patient when appropriate and closer to discharge.  Thanks, Earnie Gainer, RN, MSN, CDCES Diabetes Coordinator Inpatient Diabetes Program [phone] (Team Pager from 8am to 5pm)

## 2024-01-25 NOTE — Progress Notes (Signed)
   Inpatient Rehab Admissions Coordinator :  Per therapy recommendations, patient was screened for CIR candidacy by Heron Leavell RN MSN.   [facility] is out of network with [facility]. Recommend other AIR venue. Please call me with any questions.  Heron Leavell RN MSN Admissions Coordinator [phone]

## 2024-01-26 ENCOUNTER — Inpatient Hospital Stay (HOSPITAL_COMMUNITY)

## 2024-01-26 ENCOUNTER — Other Ambulatory Visit: Payer: Self-pay

## 2024-01-26 DIAGNOSIS — K559 Vascular disorder of intestine, unspecified: Secondary | ICD-10-CM

## 2024-01-26 LAB — TYPE AND SCREEN
ABO/RH(D): O NEG
Antibody Screen: NEGATIVE
Unit division: 0
Unit division: 0

## 2024-01-26 LAB — RENAL FUNCTION PANEL
Albumin: 2.5 g/dL — ABNORMAL LOW (ref 3.5–5.0)
Anion gap: 11 (ref 5–15)
BUN: 47 mg/dL — ABNORMAL HIGH (ref 8–23)
CO2: 27 mmol/L (ref 22–32)
Calcium: 8.6 mg/dL — ABNORMAL LOW (ref 8.9–10.3)
Chloride: 102 mmol/L (ref 98–111)
Creatinine, Ser: 1.25 mg/dL — ABNORMAL HIGH (ref 0.44–1.00)
GFR, Estimated: 49 mL/min — ABNORMAL LOW (ref >60)
Glucose, Bld: 314 mg/dL — ABNORMAL HIGH (ref 70–99)
Phosphorus: 3.3 mg/dL (ref 2.5–4.6)
Potassium: 2.7 mmol/L — CL (ref 3.5–5.1)
Sodium: 140 mmol/L (ref 135–145)

## 2024-01-26 LAB — URINALYSIS, ROUTINE W REFLEX MICROSCOPIC
Bilirubin Urine: NEGATIVE
Glucose, UA: >=500 mg/dL — AB
Ketones, ur: NEGATIVE mg/dL
Leukocytes,Ua: NEGATIVE
Nitrite: NEGATIVE
Protein, ur: NEGATIVE mg/dL
Specific Gravity, Urine: 1.008 (ref 1.005–1.030)
pH: 7 (ref 5.0–8.0)

## 2024-01-26 LAB — CBC
HCT: 25.6 % — ABNORMAL LOW (ref 36.0–46.0)
Hemoglobin: 8.5 g/dL — ABNORMAL LOW (ref 12.0–15.0)
MCH: 33.6 pg (ref 26.0–34.0)
MCHC: 33.2 g/dL (ref 30.0–36.0)
MCV: 101.2 fL — ABNORMAL HIGH (ref 80.0–100.0)
Platelets: 133 K/uL — ABNORMAL LOW (ref 150–400)
RBC: 2.53 MIL/uL — ABNORMAL LOW (ref 3.87–5.11)
RDW: 17.1 % — ABNORMAL HIGH (ref 11.5–15.5)
WBC: 11.8 K/uL — ABNORMAL HIGH (ref 4.0–10.5)
nRBC: 0.4 % — ABNORMAL HIGH (ref 0.0–0.2)

## 2024-01-26 LAB — GLUCOSE, CAPILLARY
Glucose-Capillary: 320 mg/dL — ABNORMAL HIGH (ref 70–99)
Glucose-Capillary: 224 mg/dL — ABNORMAL HIGH (ref 70–99)
Glucose-Capillary: 62 mg/dL — ABNORMAL LOW (ref 70–99)
Glucose-Capillary: 139 mg/dL — ABNORMAL HIGH (ref 70–99)
Glucose-Capillary: 178 mg/dL — ABNORMAL HIGH (ref 70–99)
Glucose-Capillary: 222 mg/dL — ABNORMAL HIGH (ref 70–99)
Glucose-Capillary: 417 mg/dL — ABNORMAL HIGH (ref 70–99)
Glucose-Capillary: 396 mg/dL — ABNORMAL HIGH (ref 70–99)

## 2024-01-26 LAB — BPAM RBC
Blood Product Expiration Date: 202512042359
Blood Product Expiration Date: 202512052359
ISSUE DATE / TIME: 202511181305
ISSUE DATE / TIME: 202511181305
Unit Type and Rh: 9500
Unit Type and Rh: 9500

## 2024-01-26 LAB — CULTURE, BLOOD (ROUTINE X 2)
Culture: NO GROWTH
Culture: NO GROWTH

## 2024-01-26 LAB — MAGNESIUM: Magnesium: 2.6 mg/dL — ABNORMAL HIGH (ref 1.7–2.4)

## 2024-01-26 LAB — TACROLIMUS LEVEL: Tacrolimus (FK506) - LabCorp: 3.7 ng/mL — ABNORMAL LOW (ref 5.0–20.0)

## 2024-01-26 MED ORDER — KCL IN DEXTROSE-NACL 20-5-0.45 MEQ/L-%-% IV SOLN
INTRAVENOUS | Status: AC
  Filled 2024-01-26: qty 1000

## 2024-01-26 MED ORDER — INSULIN ASPART 100 UNIT/ML IJ SOLN
0.0000 [IU] | INTRAMUSCULAR | Status: DC
  Administered 2024-01-26: 6 [IU] via SUBCUTANEOUS
  Administered 2024-01-26: 2 [IU] via SUBCUTANEOUS
  Administered 2024-01-26: 1 [IU] via SUBCUTANEOUS
  Administered 2024-01-27: 3 [IU] via SUBCUTANEOUS
  Administered 2024-01-27: 4 [IU] via SUBCUTANEOUS
  Filled 2024-01-26: qty 2
  Filled 2024-01-26: qty 4
  Filled 2024-01-26: qty 1
  Filled 2024-01-26: qty 6
  Filled 2024-01-26: qty 2

## 2024-01-26 MED ORDER — ONDANSETRON HCL 4 MG/2ML IJ SOLN
4.0000 mg | Freq: Four times a day (QID) | INTRAMUSCULAR | Status: DC | PRN
  Administered 2024-01-26 – 2024-02-02 (×4): 4 mg via INTRAVENOUS
  Filled 2024-01-26 (×4): qty 2

## 2024-01-26 MED ORDER — DEXTROSE 50 % IV SOLN
12.5000 g | INTRAVENOUS | Status: AC
  Administered 2024-01-26: 12.5 g via INTRAVENOUS

## 2024-01-26 MED ORDER — INSULIN ASPART 100 UNIT/ML IJ SOLN: 0.0000 [IU] | Freq: Three times a day (TID) | INTRAMUSCULAR | Status: DC

## 2024-01-26 MED ORDER — KCL IN DEXTROSE-NACL 20-5-0.45 MEQ/L-%-% IV SOLN
INTRAVENOUS | Status: DC
  Filled 2024-01-26: qty 1000

## 2024-01-26 MED ORDER — INSULIN ASPART 100 UNIT/ML IJ SOLN
2.0000 [IU] | INTRAMUSCULAR | Status: AC
  Administered 2024-01-26: 2 [IU] via SUBCUTANEOUS

## 2024-01-26 MED ORDER — ACETAMINOPHEN 10 MG/ML IV SOLN
1000.0000 mg | Freq: Four times a day (QID) | INTRAVENOUS | Status: AC
  Administered 2024-01-26 – 2024-01-27 (×3): 1000 mg via INTRAVENOUS
  Filled 2024-01-26 (×4): qty 100

## 2024-01-26 MED ORDER — POTASSIUM CHLORIDE 10 MEQ/100ML IV SOLN
10.0000 meq | INTRAVENOUS | Status: AC
  Administered 2024-01-26 (×5): 10 meq via INTRAVENOUS
  Filled 2024-01-26 (×5): qty 100

## 2024-01-26 MED ORDER — POTASSIUM CHLORIDE 20 MEQ PO PACK
40.0000 meq | PACK | ORAL | Status: DC
  Administered 2024-01-26: 40 meq
  Filled 2024-01-26: qty 2

## 2024-01-26 MED ORDER — TRAVASOL 10 % IV SOLN
INTRAVENOUS | Status: AC
  Filled 2024-01-26: qty 381.6

## 2024-01-26 MED ORDER — METHYLPREDNISOLONE SODIUM SUCC 40 MG IJ SOLR
10.0000 mg | Freq: Every day | INTRAMUSCULAR | Status: DC
  Administered 2024-01-27 – 2024-01-29 (×3): 10 mg via INTRAVENOUS
  Filled 2024-01-26 (×4): qty 1

## 2024-01-26 MED ORDER — SODIUM CHLORIDE 0.9% FLUSH
10.0000 mL | Freq: Two times a day (BID) | INTRAVENOUS | Status: DC
  Administered 2024-01-26 (×2): 10 mL
  Administered 2024-01-27: 30 mL
  Administered 2024-01-28: 10 mL
  Administered 2024-01-29: 20 mL
  Administered 2024-01-29 – 2024-01-31 (×5): 10 mL
  Administered 2024-02-01: 20 mL
  Administered 2024-02-02 – 2024-02-04 (×4): 10 mL
  Administered 2024-02-05: 20 mL
  Administered 2024-02-05 – 2024-02-06 (×2): 10 mL

## 2024-01-26 MED ORDER — PHENAZOPYRIDINE HCL 100 MG PO TABS
100.0000 mg | ORAL_TABLET | Freq: Three times a day (TID) | ORAL | Status: DC
  Filled 2024-01-26 (×2): qty 1

## 2024-01-26 MED ORDER — SODIUM CHLORIDE 0.9% FLUSH: 10.0000 mL | INTRAVENOUS | Status: DC | PRN

## 2024-01-26 MED ORDER — INSULIN GLARGINE-YFGN 100 UNIT/ML ~~LOC~~ SOLN
4.0000 [IU] | Freq: Two times a day (BID) | SUBCUTANEOUS | Status: DC
  Administered 2024-01-26 (×2): 4 [IU] via SUBCUTANEOUS
  Filled 2024-01-26 (×5): qty 0.04

## 2024-01-26 MED ORDER — PROCHLORPERAZINE EDISYLATE 10 MG/2ML IJ SOLN
10.0000 mg | Freq: Four times a day (QID) | INTRAMUSCULAR | Status: DC | PRN
  Administered 2024-01-26 – 2024-01-27 (×2): 10 mg via INTRAVENOUS
  Filled 2024-01-26 (×2): qty 2

## 2024-01-26 MED ORDER — HYDROMORPHONE HCL 1 MG/ML IJ SOLN
0.5000 mg | INTRAMUSCULAR | Status: DC | PRN
  Administered 2024-01-28: 1 mg via INTRAVENOUS
  Filled 2024-01-26: qty 1

## 2024-01-26 MED ORDER — DEXTROSE 50 % IV SOLN
INTRAVENOUS | Status: AC
Start: 2024-01-26 — End: 2024-01-26
  Filled 2024-01-26: qty 50

## 2024-01-26 MED ORDER — INSULIN ASPART 100 UNIT/ML IJ SOLN: 0.0000 [IU] | Freq: Every day | INTRAMUSCULAR | Status: DC

## 2024-01-26 NOTE — Progress Notes (Signed)
 Patient vomited X2 after drinking potassium powder drink. MD notified

## 2024-01-26 NOTE — Progress Notes (Signed)
 Per gen sx on call was told to keep NG tube clamped at this time. Day team stated that they were told NG should be to ILWS, no orders currently for ILWS , pt is on clear liquid diet. Current orders support clamping ng tube. Pt denies any n/v or abd pain at this time.

## 2024-01-26 NOTE — Progress Notes (Signed)
 eLink Physician-Brief Progress Note Patient Name: Sherri Todd DOB: 1961-02-22 MRN: [id number]   Date of Service  01/26/2024  HPI/Events of Note  Potassium of 2.7  eICU Interventions  kcl     Intervention Category Minor Interventions: Electrolytes abnormality - evaluation and management  Anjeli Casad 01/26/2024, 4:54 AM

## 2024-01-26 NOTE — Progress Notes (Signed)
 Nephrology Follow-Up Consult note   Assessment/Recommendations: Sherri Todd is a/an 63 y.o. 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  Likely secondary to shock with some degree of tubular injury. Improving -Continue with supportive care including pressors as needed -IVFs PRN -Continue to monitor daily Cr, Dose meds for GFR -Monitor Daily I/Os, Daily weight  -Maintain MAP>65 for optimal renal perfusion.  -Avoid nephrotoxic medications including NSAIDs -Use synthetic opioids (Fentanyl /Dilaudid ) if needed   Severe septic shock: Associated with ischemic bowel.  Status post surgery as below.  Continue pressors and antibiotics per primary team    Ischemic bowel: Associated with volvulus.  Also with ischemic pancreas.  Status postsurgical resection.  Surgery following   History of kidney/pancreas transplant: In 1999.  Followed by [facility].  Hold home mycophenolate  given critical illness and restart as she stabilizes.  Continue tacrolimus  as sublingual.  Trough was 3.7 which is near goal of 4.  No need to change dose at this time.  Switch to tablet formulation when taking p.o.  Stop hydrocort  and start solumedrol 10mg  -> switch to oral prednisone  5mg  daily when able. Restart MMF when tolerating p.o.   Blood loss anemia: Associated with surgery.  Transfuse as needed   Recommendations conveyed to primary service.    [facility] 01/26/2024 10:53 AM  ___________________________________________________________  CC: Nausea and vomiting  Interval History/Subjective: Patient states she continues to not feel well today.  Nausea and vomiting.  Also some diarrhea.  Unable to take her potassium tablets today Medications:   Current Facility-Administered Medications  Medication Dose Route Frequency Provider Last Rate Last Admin   0.9 %  sodium chloride  infusion   Intravenous PRN Johnson, Kelly R, PA-C       0.9 %  sodium chloride  infusion   Intravenous PRN Johnson, Kelly R, PA-C   Stopped at 01/23/24 1225   0.9 %  sodium chloride  infusion   Intravenous PRN Johnson, Kelly R, PA-C   Stopped at 01/23/24 1224   acetaminophen  (OFIRMEV ) IV 1,000 mg  1,000 mg Intravenous Q6H Johnson, Kelly R, PA-C       albuterol  (PROVENTIL ) (2.5 MG/3ML) 0.083% nebulizer solution 2.5 mg  2.5 mg Nebulization Q4H PRN Johnson, Kelly R, PA-C       Chlorhexidine  Gluconate Cloth 2 % PADS 6 each  6 each Topical Daily Vicci Burnard SAUNDERS, PA-C   6 each at 01/26/24 9060   dextrose  5 % and 0.45 % NaCl with KCl 20 mEq/L infusion   Intravenous Continuous Johnson, Kelly R, PA-C       heparin  injection 5,000 Units  5,000 Units Subcutaneous Q8H Vicci Burnard SAUNDERS, PA-C   5,000 Units at 01/26/24 9487   hydrALAZINE  (APRESOLINE ) injection 10 mg  10 mg Intravenous Q6H PRN Gretta Doffing P, DO       hydrocortisone  sodium succinate  (SOLU-CORTEF ) 100 MG injection 100 mg  100 mg Intravenous Q8H Whiteheart, Kathryn A, NP   100 mg at 01/26/24 9071   HYDROmorphone  (DILAUDID ) injection 0.5-2 mg  0.5-2 mg Intravenous Q2H PRN Johnson, Kelly R, PA-C       insulin  aspart (novoLOG ) injection 0-15 Units  0-15 Units Subcutaneous Q4H Bowser, Ronnald BRAVO, NP   5 Units at 01/26/24 0500   labetalol  (NORMODYNE ) injection 10 mg  10  mg Intravenous Q2H PRN Gretta Leita SQUIBB, DO   10 mg at 01/25/24 1148   mupirocin  ointment (BACTROBAN ) 2 % 1 Application  1 Application Nasal BID Vicci Burnard SAUNDERS, PA-C   1 Application at 01/26/24 9070   ondansetron  (ZOFRAN ) injection 4 mg  4 mg Intravenous Q6H PRN Samtani, Jai-Gurmukh, MD   4 mg at 01/26/24 9164   Oral care mouth rinse  15 mL Mouth Rinse PRN Vicci Burnard SAUNDERS, PA-C       Oral care mouth rinse  15 mL Mouth Rinse PRN Gretta Leita P, DO        pantoprazole  (PROTONIX ) injection 40 mg  40 mg Intravenous Daily Vicci Burnard SAUNDERS, PA-C   40 mg at 01/26/24 9070   piperacillin -tazobactam (ZOSYN ) IVPB 3.375 g  3.375 g Intravenous Q8H Alveria Modest, RPH 12.5 mL/hr at 01/26/24 0741 Infusion Verify at 01/26/24 0741   potassium chloride  10 mEq in 100 mL IVPB  10 mEq Intravenous Q1 Hr x 5 Samtani, Jai-Gurmukh, MD 100 mL/hr at 01/26/24 1036 10 mEq at 01/26/24 1036   tacrolimus  (PROGRAF ) capsule 0.5 mg  0.5 mg Sublingual BID Vicci Burnard SAUNDERS, PA-C   0.5 mg at 01/26/24 9060      Review of Systems: 12 systems reviewed and negative except per HPI  Physical Exam: Vitals:   01/26/24 0422 01/26/24 0832  BP: (!) 175/86 (!) 180/88  Pulse: 73 78  Resp: 20 19  Temp: 98.9 F (37.2 C) 99.3 F (37.4 C)  SpO2: 95% 95%   Total I/O In: 29.9 [IV Piggyback:29.9] Out: -   Intake/Output Summary (Last 24 hours) at 01/26/2024 1053 Last data filed at 01/26/2024 0741 Gross per 24 hour  Intake 792.67 ml  Output 1996 ml  Net -1203.33 ml   Constitutional: ill-appearing, no acute distress ENMT: ears and nose without scars or lesions, MMM CV: normal rate, no edema Respiratory: Bilateral chest rise, no increased work of breathing Gastrointestinal: Surgical incision present, NG tube in place Skin: no visible lesions or rashes Psych: Awake and alert, answers some questions   Test Results I personally reviewed new and old clinical labs and radiology tests Lab Results  Component Value Date   NA 140 01/26/2024   K 2.7 (LL) 01/26/2024   CL 102 01/26/2024   CO2 27 01/26/2024   BUN 47 (H) 01/26/2024   CREATININE 1.25 (H) 01/26/2024   CALCIUM  8.6 (L) 01/26/2024   ALBUMIN  2.5 (L) 01/26/2024   PHOS 3.3 01/26/2024    CBC Recent Labs  Lab 01/21/24 2248 01/21/24 2323 01/24/24 0913 01/25/24 0458 01/26/24 0334  WBC 16.3*   < > 19.1* 20.5* 11.8*  NEUTROABS 14.3*  --   --   --   --   HGB 13.3   < > 8.2* 8.5* 8.5*  HCT 39.7   < > 24.0* 24.8* 25.6*   MCV 107.9*   < > 100.4* 102.1* 101.2*  PLT 214   < > 131* 131* 133*   < > = values in this interval not displayed.

## 2024-01-26 NOTE — Plan of Care (Addendum)
   RN reported that patient is complaining about burning sensation on Foley catheter throughout the day.  She is voiding well. Getting UA and urine culture to rule out UTI.

## 2024-01-26 NOTE — Progress Notes (Signed)
 PT Cancellation Note  Patient Details Name: Sherri Todd MRN: [id number] DOB: 09-28-1960   Cancelled Treatment:    Reason Eval/Treat Not Completed: (P) Other (comment) (pt more confused/anxious/disoriented at time of attempt, defer PT session to following date when pt may be able to follow commands better.)   [name] 01/26/2024, 5:01 PM

## 2024-01-26 NOTE — Progress Notes (Signed)
 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 meds so getting x-ray which shows ileus/bowel loops---PEr Gen surg--likely suction/CT if no better D5/ns + 20 K @100  cc/h General Surgery following-?  Imaging over the next several days if does not improve pain control Tylenol  every 6 IV, Dilaudid  0.5 every 2 as needed for pain  Diabetes mellitus type 1 status post transplant on CellCept  tacrolimus  and prednisone  On Solu-Cortef  100 every 8 SL Tacrolimus  0.5 SL Cannot give cellcept  at this time Was hypoglycemic today ---holding lantus  5, SSI q 4 only-D5 as above Anemia of expected blood loss from surgery-underlying macrocytosis and mild thrombocytopenia Probably all postoperative/dilutional Daily labs for now Stroke complicating transplant as above-fully recovered at the time No persisting current focal deficits Uncontrolled hypertension As needed hydralazine  ordered 5 mg IV for blood pressure above 160 AKI on admission secondary to sepsis Resolving well--fluids as above Will need TPN and placing PICC line order Depression Npo for now holding home meds Prozac  at this  time  Data Reviewed today:  Sodium 140 potassium 2.7 magnesium  2.6 BUN/creatinine 47/1.2 WBC 11.8 hemoglobin 8.5 platelet 133 MCV 101  DVT prophylaxis: Heparin   Status is: Inpatient Inpatient pending resolution    Dispo/Global plan: Unclear at this time has an ileus and unable to take p.o. TPN being started defer to general surgery postop management   Time 60   Subjective:   Alerted by nursing several episodes of nausea vomiting-NG tube has been placed to suction and advanced and she seems a little better She was hypoglycemic this morning and received D50    Objective + exam Vitals:   01/25/24 2106 01/25/24 2353 01/26/24 0422 01/26/24 0832  BP: (!) 157/82 (!) 141/69 (!) 175/86 (!) 180/88  Pulse: 82 85 73 78  Resp: 19 18 20 19   Temp: 98.2 F (36.8 C) 98.7 F (37.1 C) 98.9 F (37.2 C) 99.3 F (37.4 C)  TempSrc: Oral Oral Oral Axillary  SpO2: 95% (!) 88% 95% 95%  Weight:   37.3 kg   Height:       Filed Weights   01/23/24 0600 01/24/24 0500 01/26/24 0422  Weight: 51.6 kg 49 kg 37.3 kg     Examination: Coherent ill-appearing white female no distress NG tube in place Mucosa dry S1-S2 slightly tachycardic seems to be sinus Large reinforced bandage over abdomen slight distention No lower extremity edema Chest is clear no wheeze Neuro is intact     Scheduled Meds:  Chlorhexidine  Gluconate Cloth  6 each Topical Daily   feeding supplement  1 Container Oral TID BM   FLUoxetine   20 mg Oral Daily   heparin   5,000 Units Subcutaneous Q8H   hydrocortisone  sod succinate (SOLU-CORTEF )  inj  100 mg Intravenous Q8H   insulin  aspart  0-15 Units Subcutaneous Q4H   insulin  glargine-yfgn  5 Units Subcutaneous QHS   mupirocin  ointment  1 Application Nasal BID   pantoprazole  (PROTONIX ) IV  40 mg Intravenous Daily   tacrolimus   0.5 mg Sublingual BID   Continuous Infusions:  sodium chloride      sodium chloride  Stopped (01/23/24 1225)   sodium chloride  Stopped (01/23/24 1224)    acetaminophen      dextrose  5 % and 0.45 % NaCl with KCl 20 mEq/L     piperacillin -tazobactam (ZOSYN )  IV 12.5 mL/hr at 01/26/24 0741   potassium chloride  10 mEq (01/26/24 0900)   sodium chloride , sodium chloride , sodium chloride , albuterol , hydrALAZINE , HYDROmorphone  (DILAUDID ) injection, labetalol , ondansetron  (ZOFRAN ) IV, mouth rinse, mouth rinse  Colen Grimes, MD  [facility]

## 2024-01-26 NOTE — Plan of Care (Signed)
  Problem: Nutrition: Goal: Adequate nutrition will be maintained Outcome: Progressing   Problem: Activity: Goal: Risk for activity intolerance will decrease Outcome: Progressing   Problem: Pain Managment: Goal: General experience of comfort will improve and/or be controlled Outcome: Progressing   Problem: Elimination: Goal: Will not experience complications related to bowel motility Outcome: Progressing   Problem: Health Behavior/Discharge Planning: Goal: Ability to identify and utilize available resources and services will improve Outcome: Progressing   Problem: Tissue Perfusion: Goal: Adequacy of tissue perfusion will improve Outcome: Progressing

## 2024-01-26 NOTE — Progress Notes (Signed)
   01/26/24 1524  Vitals  Temp (!) 97.5 F (36.4 C)  Temp Source Oral  BP (!) 163/81  MAP (mmHg) 123  BP Location Left Arm  BP Method Automatic  Patient Position (if appropriate) Lying  Pulse Rate 84  ECG Heart Rate 84  Resp 19  MEWS COLOR  MEWS Score Color Green  Oxygen Therapy  SpO2 98 %  O2 Device Room Air  MEWS Score  MEWS Temp 0  MEWS Systolic 0  MEWS Pulse 0  MEWS RR 0  MEWS LOC 0  MEWS Score 0   Patient screaming in the hallway, unsure of where she is, appears fearful, patient is alert to self and time, disoriented situation, place. See flow sheet charting.  This is consistent with previous charting. Consulting Civil Engineer and bedside RN aware.Jaquetta Currier Jessup RN

## 2024-01-26 NOTE — Progress Notes (Signed)
 PHARMACY - TOTAL PARENTERAL NUTRITION CONSULT NOTE   Indication: Prolonged ileus  Patient Measurements: Height: 5' 1 (154.9 cm) Weight: 37.3 kg (82 lb 3.7 oz) (removed all bedding) IBW/kg (Calculated) : 47.8 TPN AdjBW (KG): 51.6 Body mass index is 15.54 kg/m. Usual Weight: 49 kg  Assessment: 63 years of  age female with history of kidney and pancreas transplant 12/1997 followed [facility] admitted for abdominal pain, nausea, and hypotension found to have internal hernia with small bowel volvulus and necrosis of terminal ileum and ascending colon requiring emergent laparotomy for resection of distal ileum and ascending colon, explant of transplanted pancreas, and ABThera VAC placement on 11/17 and returned for abdominal washout, ileocolic anastomosis, and abdominal closure 11/18. Patient was extubated 11/19. Patient is having bowel function but also nausea and vomiting with likely ileus. NGT placed to low-intermittent suction and patient made NPO. Pharmacy consulted for TPN.   Patient has a noted intolerance to soy as nausea - we will use SMOFlipids, which of our two lipid choices, has the least amount of soybean oil and monitor tolerance.   Glucose / Insulin : CBG 62 (after mod SSI 16 units; treated with dextrose ) -314. Changed to semglee  4 BID + very sensitive SSI q4h. Steroids reduced from Hydrocortisone  100 every 8 hours to Solumedrol 10mg  daily (significant decrease)- will adjust and overlap IVF containing dextrose  with TPN start to ensure consistent amount of dextrose  given as we monitor patient response in setting of removed transplanted pancreas.  Electrolytes: Na 140, K low 2.7 ( KCl  replacement + ~55mEq from IV fluids), Phos 3.3, Mg 2.6, CoCa 9.8 Renal: AKI resolving, SCr trending down at 1.25, BUN up 47.  Hepatic: LFTs/Tbili/TG within normal limits. Albumin  2.5.  Intake / Output; MIVF: UOP 1506 mL (1.7 mL/kg/hr). NGT output 580 mL. ABThera VAC removed. Stool x4 (large, type 7).  Current IVF Dextrose  5% - 1/2 NS with K 20 mEq/L at 100 ml/hr x1 day GI Imaging: 11/17 KUB - dilated small bowel loops consistent with obstruction 11/17 CT Abd - distal small bowel obstruction d/t mesenteric volvulus, no free gas, trace ascites 11/21 KUB - pending GI Surgeries / Procedures:  11/17 Exlap resection of distal ileum and ascending colon, explant of transplanted pancreas, and ABThera VAC placement  11/18 abdominal washout, ileocolic anastomosis, and abdominal closure  Central access: PICC pending  TPN start date: 01/26/24  Nutritional Goals: Goal TPN rate is 65 mL/hr (provides 83 g of protein and 1587 kcals per day)  RD Assessment: Estimated Needs Total Energy Estimated Needs: 1500-1700 kcals Total Protein Estimated Needs: 75-90 g Total Fluid Estimated Needs: 1.5L  Current Nutrition:  NPO and TPN *Will need pancreatic enzymes once taking enteral diet  Plan:  Start TPN at 30 mL/hr at 1800- providing ~49% of needs. High refeeding risk and cautious glucose monitoring with removal of transplanted pancreas. *This TPN provides ~108g dextrose .  Electrolytes in TPN: Na 90mEq/L, K 60 mEq/L, Ca 32mEq/L, Mg 16mEq/L, and Phos 15mmol/L. Cl:Ac 1:1 Add standard MVI and trace elements to TPN Continue Very Sensitive q4h SSI and Semglee  4 units BID per Diabetic coordinator recommendations. Will adjust as needed.  Reduce MIVF to 50 mL/hr at 1800, and end tomorrow at noon for the completion of prior order. Will re-evaluate dextrose  amounts and toleration in AM to determine further adjustments.  Monitor TPN labs on Mon/Thurs, daily electrolytes for next 3 days.    Harlene Boga, PharmD, BCPS, BCCCP Please refer to [facility] for [facility] Pharmacy numbers 01/26/2024,9:59 AM

## 2024-01-26 NOTE — Progress Notes (Signed)
 Nutrition Follow-up  DOCUMENTATION CODES:  Non-severe (moderate) malnutrition in context of chronic illness  INTERVENTION:  Recommend initiation of TPN given ongoing intolerance to oral nutrition/diet advancement; discussed with MD and Surgery   Insulin  regimen discussed with MD, DM coordinator and Pharmacy  Monitor magnesium , potassium, and phosphorus daily for at least 3 days, MD to replete as needed, as pt is at risk for refeeding syndrome. 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 - remains applicable  GOAL:  Patient will meet greater than or equal to 90% of their needs - goal unmet; recommending nutrition support  MONITOR:  Diet advancement, Supplement acceptance, Labs  REASON FOR ASSESSMENT:  Consult, Ventilator Assessment of nutrition requirement/status  ASSESSMENT:  63 yo female admitted with septic shock with SBO from mesenteric volvulus from internal hernia and taken emergently to OR for ex lap requiring bowel resection for necrosis and explant of transplanted pancreas.  PMH includes type 1 DM and ESRD s/p pancreas and kidney transplants in 1999, HTN, CVA. Pt is followed by [facility]  11/17 CT: SBO secondary to mesenteric volvulus; OR: Ex Lap, resection of distal ileum and ascending colon, explant of transplanted pancreas, open abdomen with wound VAC  11/18: OR- re-opening of prior laparotomy, ileocolonic anastomosis as proximal transverse colon, suture repair  upon closure of umbilical  hernia 11/19 extubated; diet advanced to clear liquids 11/21 emesis; NGT back to suction; DG abdomen- decreased gaseous distension of small bowel loops  Patient having imaging done at time of visit. Spoke with RN who is concerned with low blood sugar on 62. Pt has been unable to keep any orals down.  Of note, potassium ordered per tube given critically low level, patient unable to keep this down without emesis. Now scheduled via IV.   Discussed with Surgery and MD recommendation for TPN.  Reached out to  DM coordinator via secure chat as well in attempt to coordinate insulin  recommendations with initiation of TPN. DM coordinator and Pharmacy coordinating recommendations.    Emesis- documented since 5am  Current weight likely not accurate. Suspect significant change d/t change in beds upon transfer out of ICU to progressive care unit.  Will monitor trends.   Drains/lines: Double lumen PICC line NGT   TPN plan: Start TPN at 30 mL/hr at 1800- providing ~49% of needs.  *provides ~108g dextrose  Goal TPN rate is 65 mL/hr (provides 83 g of protein and 1587 kcals per day)    Medications: SSI 0-6 units q4h, semglee  4 units BID, solu-medrol , protonix , IV abx, IV potassium chloride   Labs:  Potassium 2.7 BUN 47 Cr 1.25 Mg 2.6 GFR 49 CBG's 62-320 x24 hours  Diet Order:   Diet Order             Diet NPO time specified Except for: Ice Chips  Diet effective now                   EDUCATION NEEDS:  Not appropriate for education at this time  Skin:  Skin Assessment: Skin Integrity Issues: Skin Integrity Issues:: Wound VAC Wound Vac: abdomen removed  Last BM:  11/20 type 7 x4  Height:  Ht Readings from Last 1 Encounters:  01/23/24 5' 1 (1.549 m)    Weight:  Wt Readings from Last 1 Encounters:  01/26/24 37.3 kg   BMI:  Body mass index is 15.54 kg/m.  Estimated Nutritional Needs:   Kcal:  1500-1700 kcals  Protein:  75-90 g  Fluid:  1.5L  Allie Marchia Diguglielmo,  RDN, LDN Clinical Nutrition See AMiON for contact information.

## 2024-01-26 NOTE — Inpatient Diabetes Management (Signed)
 Inpatient Diabetes Program Recommendations  AACE/ADA: New Consensus Statement on Inpatient Glycemic Control (2015)  Target Ranges:  Prepandial:   less than 140 mg/dL      Peak postprandial:   less than 180 mg/dL (1-2 hours)      Critically ill patients:  140 - 180 mg/dL   Lab Results  Component Value Date   GLUCAP 139 (H) 01/26/2024   HGBA1C 5.0 01/22/2024    Review of Glycemic Control  Latest Reference Range & Units 01/25/24 20:45 01/25/24 21:11 01/26/24 02:20 01/26/24 04:24 01/26/24 08:31 01/26/24 09:45  Glucose-Capillary 70 - 99 mg/dL 709 (H) 732 (H) 679 (H) 224 (H) 62 (L) 139 (H)  Diabetes history: DM1 (ex-transplanted pancreas) Outpatient Diabetes medications: None; had been on insulin  prior to pancreas transplant in past Current orders for Inpatient glycemic control: Semglee  4 units bid, Novolog  0-6 units q 4 hours, Solumedrol 10 mg daily   Inpatient Diabetes Program Recommendations:    Agree with current orders.  Patient to start TPN this evening.  Communicated with MD, dietician, and pharmacist.  Will follow.   Thanks,  Randall Bullocks, RN, BC-ADM Inpatient Diabetes Coordinator Pager [phone]  (8a-5p)

## 2024-01-26 NOTE — Progress Notes (Signed)
 Peripherally Inserted Central Catheter Placement  The IV Nurse has discussed with the patient and/or persons authorized to consent for the patient, the purpose of this procedure and the potential benefits and risks involved with this procedure.  The benefits include less needle sticks, lab draws from the catheter, and the patient may be discharged home with the catheter. Risks include, but not limited to, infection, bleeding, blood clot (thrombus formation), and puncture of an artery; nerve damage and irregular heartbeat and possibility to perform a PICC exchange if needed/ordered by physician.  Alternatives to this procedure were also discussed.  Bard Power PICC patient education guide, fact sheet on infection prevention and patient information card has been provided to patient /or left at bedside.    PICC Placement Documentation  PICC Double Lumen 01/26/24 Right Basilic 36 cm 0 cm (Active)  Indication for Insertion or Continuance of Line Administration of hyperosmolar/irritating solutions (i.e. TPN, Vancomycin, etc.) 01/26/24 1143  Exposed Catheter (cm) 0 cm 01/26/24 1143  Site Assessment Clean, Dry, Intact 01/26/24 1143  Lumen #1 Status Flushed;Blood return noted;Saline locked 01/26/24 1143  Lumen #2 Status Flushed;Blood return noted;Saline locked 01/26/24 1143  Dressing Type Transparent 01/26/24 1143  Dressing Status Antimicrobial disc/dressing in place 01/26/24 1143  Line Care Connections checked and tightened 01/26/24 1143  Line Adjustment (NICU/IV Team Only) No 01/26/24 1143  Dressing Intervention New dressing 01/26/24 1143  Dressing Change Due 02/02/24 01/26/24 1143       Sherri Todd 01/26/2024, 11:46 AM

## 2024-01-26 NOTE — Progress Notes (Signed)
 Progress Note  3 Days Post-Op  Subjective: Vomiting overnight although also having diarrhea.   Objective: Vital signs in last 24 hours: Temp:  [97.6 F (36.4 C)-99.3 F (37.4 C)] 99.3 F (37.4 C) (11/21 0832) Pulse Rate:  [73-89] 78 (11/21 0832) Resp:  [14-31] 19 (11/21 0832) BP: (93-214)/(43-95) 180/88 (11/21 0832) SpO2:  [88 %-98 %] 95 % (11/21 0832) Arterial Line BP: (122-225)/(41-94) 122/41 (11/20 1230) Weight:  [37.3 kg] 37.3 kg (11/21 0422) Last BM Date : 01/25/24  Intake/Output from previous day: 11/20 0701 - 11/21 0700 In: 798.1 [P.O.:445; NG/GT:155; IV Piggyback:198.1] Out: 2086 [Urine:1506; Emesis/NG output:580] Intake/Output this shift: Total I/O In: 29.9 [IV Piggyback:29.9] Out: -   PE: General: WD, WN female Heart: regular, rate, and rhythm. Palpable radial and pedal pulses bilaterally Lungs: normal effort Abd: soft, minimal distention, NGT with thin bilious drainage, midline dressing c/d/i GU: foley present with straw colored urine     Lab Results:  Recent Labs    01/25/24 0458 01/26/24 0334  WBC 20.5* 11.8*  HGB 8.5* 8.5*  HCT 24.8* 25.6*  PLT 131* 133*   BMET Recent Labs    01/25/24 0458 01/26/24 0334  NA 142 140  K 3.7 2.7*  CL 104 102  CO2 25 27  GLUCOSE 126* 314*  BUN 43* 47*  CREATININE 1.40* 1.25*  CALCIUM  8.3* 8.6*   PT/INR No results for input(s): LABPROT, INR in the last 72 hours.  CMP     Component Value Date/Time   NA 140 01/26/2024 0334   K 2.7 (LL) 01/26/2024 0334   CL 102 01/26/2024 0334   CO2 27 01/26/2024 0334   GLUCOSE 314 (H) 01/26/2024 0334   BUN 47 (H) 01/26/2024 0334   CREATININE 1.25 (H) 01/26/2024 0334   CALCIUM  8.6 (L) 01/26/2024 0334   PROT 4.1 (L) 01/22/2024 0908   ALBUMIN  2.5 (L) 01/26/2024 0334   AST 36 01/22/2024 0908   ALT 14 01/22/2024 0908   ALKPHOS 28 (L) 01/22/2024 0908   BILITOT 1.0 01/22/2024 0908   GFRNONAA 49 (L) 01/26/2024 0334   Lipase  No results found for:  LIPASE     Studies/Results: DG CHEST PORT 1 VIEW Result Date: 01/22/2024 EXAM: 1 VIEW(S) XRAY OF THE CHEST 01/22/2024 12:25:00 AM COMPARISON: None available. CLINICAL HISTORY: History of ETT [id number]; Encounter for orogastric (OG) tube placement [id number] FINDINGS: LINES, TUBES AND DEVICES: Endotracheal tube with tip extended 2 cm down the right main bronchus and needs to be withdrawn 5 cm to the mid trachea. Nasogastric tube has been inserted and terminates in the distal body of the stomach. LUNGS AND PLEURA: Vascular markings are normal. No focal pneumonia is seen. No pleural effusion. No pneumothorax. HEART AND MEDIASTINUM: There is mild chronic cardiomegaly. The mediastinum is normally outlined. BONES AND SOFT TISSUES: A bone island is again noted in the anterior right 3rd rib. Slight thoracic dextroscoliosis. IMPRESSION: 1. Endotracheal tube malposition with tip 2 cm into the right main bronchus; recommend withdrawal by 5 cm to the mid trachea. 2. Mild chronic cardiomegaly. 3. No evidence of chf or other acute chest process. 4. These results will be telephoned to the referring provider or the referring providers representative by professional radiology assistant [facility]) personnel, with communication documented in the [facility] dashboard. Electronically signed by: Francis Quam MD 01/22/2024 07:20 AM EST RP Workstation: [id number]   DG Abd Portable 1 View Result Date: 01/22/2024 EXAM: 1 VIEW XRAY OF THE ABDOMEN 01/22/2024 01:49:00 AM COMPARISON: None available. CLINICAL HISTORY: NG  tube placement. FINDINGS: LINES, TUBES AND DEVICES: Nasogastric tube tip overlies the distal body of the stomach. BOWEL: Dilated loops of small bowel were seen throughout the visualized abdomen in keeping with a distal small bowel obstruction. No free air. SOFT TISSUES: No opaque urinary calculi. BONES: No acute osseous abnormality. IMPRESSION: 1. Dilated loops of small bowel consistent with distal small bowel obstruction. No free  air. 2. Nasogastric tube tip overlies the distal body of the stomach. Electronically signed by: Dorethia Molt MD 01/22/2024 02:00 AM EST RP Workstation: [id number]   CT ABDOMEN PELVIS WO CONTRAST Addendum Date: 01/22/2024 ADDENDUM #1 ADDENDUM: The critical findings of this study were discussed directly with Dr. Bari by myself at 1:37 am Electronically signed by: Dorethia Molt MD 01/22/2024 01:37 AM EST RP Workstation: [id number]   Result Date: 01/22/2024  ORIGINAL REPORT  EXAM: CT ABDOMEN AND PELVIS WITHOUT CONTRAST 01/22/2024 01:00:39 AM TECHNIQUE: CT of the abdomen and pelvis was performed without the administration of intravenous contrast. Multiplanar reformatted images are provided for review. Automated exposure control, iterative reconstruction, and/or weight-based adjustment of the mA/kV was utilized to reduce the radiation dose to as low as reasonably achievable. COMPARISON: None available. CLINICAL HISTORY: Abdominal pain, acute, nonlocalized; Sepsis. FINDINGS: LOWER CHEST: Extensive coronary artery calcifications. The distal esophagus is fluid filled with changes of esophageal dysmotility or gastroesophageal reflux. Superimposed circumferential wall thickening may reflect changes of reflux esophagitis. LIVER: The liver is unremarkable. GALLBLADDER AND BILE DUCTS: Gallbladder is unremarkable. No biliary ductal dilatation. SPLEEN: No acute abnormality. PANCREAS: No acute abnormality. ADRENAL GLANDS: No acute abnormality. KIDNEYS, URETERS AND BLADDER: Native kidneys are markedly atrophic in keeping with given history of end-stage renal disease. Transplant kidney noted within the left iliac fossa with small perinephric fluid collection present also representing a postoperative seroma, lymphocele, or chronic hematoma. No significant mass effect on the transplant kidney. No stones in the kidneys or ureters. No hydronephrosis. No perinephric or periureteral stranding. Urinary bladder is unremarkable. GI  AND BOWEL: There is a distal small bowel obstruction present with numerous loops of more proximal dilated, fluid-filled loops of small bowel as well as fluid distention of the stomach and duodenum. There is a whirl appearance of the mesenteric vasculature in keeping with a mesenteric volvulus best appreciated on image 51 - 56, series 3. The point of obstruction occurs just distal to an anastomotic line noted within the mid pelvis. The terminal ileum and colon are decompressed. PERITONEUM AND RETROPERITONEUM: Trace ascites. No free intraperitoneal gas. VASCULATURE: Aorta is normal in caliber. LYMPH NODES: No lymphadenopathy. REPRODUCTIVE ORGANS: Uterus is unremarkable. No adnexal masses. BONES AND SOFT TISSUES: Osseous structures are age appropriate. No acute bone abnormality. No lytic or blastic bone lesion. Small fat-containing umbilical hernia. IMPRESSION: 1. 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 No free intraperitoneal gas. Trace ascites. 2. Changes of esophageal dysmotility or gastroesophageal reflux with superimposed circumferential distal esophageal wall thickening, possibly reflecting reflux esophagitis. 3. Transplant kidney in the left iliac fossa with small perinephric fluid collection, possibly representing a postoperative seroma, lymphocele, or chronic hematoma. No significant mass effect on the transplant kidney. 4. Reported pancreatic transplant is not well appreciated on this examination due to noncontrast technique and superimposed pathology. Electronically signed by: Dorethia Molt MD 01/22/2024 01:31 AM EST RP Workstation: [id number]   DG Chest Port 1 View if patient is in a treatment room. Result Date: 01/22/2024 EXAM: 1 VIEW(S) XRAY OF THE CHEST 01/22/2024 12:46:00 AM COMPARISON: Chest x-ray 05/16/2022, CT right  shoulder 01/17/2015. CLINICAL HISTORY: Suspected Sepsis. FINDINGS: LUNGS AND PLEURA: Stable chronic 6 mm peripheral right upper lobe nodule. No  focal pulmonary opacity. No pleural effusion. No pneumothorax. HEART AND MEDIASTINUM: No acute abnormality of the cardiac and mediastinal silhouettes. BONES AND SOFT TISSUES: No acute osseous abnormality. IMPRESSION: 1. No acute cardiopulmonary process. Electronically signed by: Morgane Naveau MD 01/22/2024 12:55 AM EST RP Workstation: [id number]    Anti-infectives: Anti-infectives (From admission, onward)    Start     Dose/Rate Route Frequency Ordered Stop   01/24/24 1400  piperacillin -tazobactam (ZOSYN ) IVPB 3.375 g        3.375 g 12.5 mL/hr over 240 Minutes Intravenous Every 8 hours 01/24/24 0957     01/22/24 1200  piperacillin -tazobactam (ZOSYN ) IVPB 2.25 g  Status:  Discontinued        2.25 g 100 mL/hr over 30 Minutes Intravenous Every 6 hours 01/22/24 9386 01/24/24 0957   01/21/24 2330  ceFEPIme  (MAXIPIME ) 2 g in sodium chloride  0.9 % 100 mL IVPB        2 g 200 mL/hr over 30 Minutes Intravenous  Once 01/21/24 2316 01/21/24 2356   01/21/24 2330  metroNIDAZOLE  (FLAGYL ) IVPB 500 mg        500 mg 100 mL/hr over 60 Minutes Intravenous  Once 01/21/24 2316 01/22/24 0109        Assessment/Plan  Hx of kidney-pancreas transplant 1999, followed at [facility]  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 Dasie  POD3 ex-lap, abdominal washout, ileocolic anastomosis, abdominal closure Dr. Aron - having bowel function but also n/v overnight - likely ileus, NGT to LIWS and NPO - PICC and TPN ordered  - discussed NGT advancement with RN based on KUB - continue abx - BID WTD to midline wound   FEN: NPO, NGT to LIWS, TPN to start tonight, D5 1/2 NS +20 mEq KCL @100  cc/h for now  VTE: SQH ID: Zosyn  11/17>>  - per CCM -  HTN HLD Asthma Hx of CVA  LOS: 4 days    Burnard JONELLE Louder, [facility] Surgery 01/26/2024,  9:32 AM Please see Amion for pager number during day hours 7:00am-4:30pm

## 2024-01-27 DIAGNOSIS — K559 Vascular disorder of intestine, unspecified: Secondary | ICD-10-CM | POA: Diagnosis not present

## 2024-01-27 LAB — RENAL FUNCTION PANEL
Albumin: 2.4 g/dL — ABNORMAL LOW (ref 3.5–5.0)
Albumin: 2.5 g/dL — ABNORMAL LOW (ref 3.5–5.0)
Anion gap: 11 (ref 5–15)
Anion gap: 6 (ref 5–15)
BUN: 28 mg/dL — ABNORMAL HIGH (ref 8–23)
BUN: 23 mg/dL (ref 8–23)
CO2: 31 mmol/L (ref 22–32)
CO2: 31 mmol/L (ref 22–32)
Calcium: 8.5 mg/dL — ABNORMAL LOW (ref 8.9–10.3)
Calcium: 8.6 mg/dL — ABNORMAL LOW (ref 8.9–10.3)
Chloride: 101 mmol/L (ref 98–111)
Chloride: 104 mmol/L (ref 98–111)
Creatinine, Ser: 0.98 mg/dL (ref 0.44–1.00)
Creatinine, Ser: 0.82 mg/dL (ref 0.44–1.00)
GFR, Estimated: >60 mL/min (ref >60)
GFR, Estimated: >60 mL/min (ref >60)
Glucose, Bld: 295 mg/dL — ABNORMAL HIGH (ref 70–99)
Glucose, Bld: 75 mg/dL (ref 70–99)
Phosphorus: 2.1 mg/dL — ABNORMAL LOW (ref 2.5–4.6)
Phosphorus: 2.7 mg/dL (ref 2.5–4.6)
Potassium: 2.6 mmol/L — CL (ref 3.5–5.1)
Potassium: 3.5 mmol/L (ref 3.5–5.1)
Sodium: 143 mmol/L (ref 135–145)
Sodium: 141 mmol/L (ref 135–145)

## 2024-01-27 LAB — GLUCOSE, CAPILLARY
Glucose-Capillary: 132 mg/dL — ABNORMAL HIGH (ref 70–99)
Glucose-Capillary: 134 mg/dL — ABNORMAL HIGH (ref 70–99)
Glucose-Capillary: 239 mg/dL — ABNORMAL HIGH (ref 70–99)
Glucose-Capillary: 288 mg/dL — ABNORMAL HIGH (ref 70–99)
Glucose-Capillary: 293 mg/dL — ABNORMAL HIGH (ref 70–99)
Glucose-Capillary: 304 mg/dL — ABNORMAL HIGH (ref 70–99)
Glucose-Capillary: 309 mg/dL — ABNORMAL HIGH (ref 70–99)
Glucose-Capillary: 55 mg/dL — ABNORMAL LOW (ref 70–99)
Glucose-Capillary: 56 mg/dL — ABNORMAL LOW (ref 70–99)

## 2024-01-27 LAB — URINE CULTURE
Culture: NO GROWTH
Special Requests: NORMAL

## 2024-01-27 LAB — CBC
HCT: 26.6 % — ABNORMAL LOW (ref 36.0–46.0)
Hemoglobin: 9 g/dL — ABNORMAL LOW (ref 12.0–15.0)
MCH: 33.7 pg (ref 26.0–34.0)
MCHC: 33.8 g/dL (ref 30.0–36.0)
MCV: 99.6 fL (ref 80.0–100.0)
Platelets: 109 K/uL — ABNORMAL LOW (ref 150–400)
RBC: 2.67 MIL/uL — ABNORMAL LOW (ref 3.87–5.11)
RDW: 17 % — ABNORMAL HIGH (ref 11.5–15.5)
WBC: 12.8 K/uL — ABNORMAL HIGH (ref 4.0–10.5)
nRBC: 5.1 % — ABNORMAL HIGH (ref 0.0–0.2)

## 2024-01-27 LAB — POTASSIUM: Potassium: 4.1 mmol/L (ref 3.5–5.1)

## 2024-01-27 LAB — MAGNESIUM: Magnesium: 1.8 mg/dL (ref 1.7–2.4)

## 2024-01-27 MED ORDER — SODIUM CHLORIDE 0.9 % IV SOLN
15.0000 mmol | Freq: Once | INTRAVENOUS | Status: AC
  Administered 2024-01-27: 15 mmol via INTRAVENOUS
  Filled 2024-01-27: qty 5

## 2024-01-27 MED ORDER — TRAVASOL 10 % IV SOLN
INTRAVENOUS | Status: AC
  Filled 2024-01-27: qty 381.6

## 2024-01-27 MED ORDER — DEXTROSE 50 % IV SOLN: 12.5000 g | INTRAVENOUS | Status: AC

## 2024-01-27 MED ORDER — INSULIN ASPART 100 UNIT/ML IJ SOLN: 0.0000 [IU] | Freq: Four times a day (QID) | INTRAMUSCULAR | Status: DC | PRN

## 2024-01-27 MED ORDER — DEXTROSE 50 % IV SOLN: 12.5000 g | INTRAVENOUS | Status: DC | PRN

## 2024-01-27 MED ORDER — INSULIN ASPART 100 UNIT/ML IJ SOLN
0.0000 [IU] | INTRAMUSCULAR | Status: DC
  Administered 2024-01-27: 5 [IU] via SUBCUTANEOUS
  Administered 2024-01-27: 7 [IU] via SUBCUTANEOUS
  Administered 2024-01-27: 5 [IU] via SUBCUTANEOUS
  Filled 2024-01-27: qty 5
  Filled 2024-01-27: qty 7
  Filled 2024-01-27: qty 5

## 2024-01-27 MED ORDER — DEXTROSE 50 % IV SOLN
INTRAVENOUS | Status: AC
  Administered 2024-01-27: 25 mL
  Filled 2024-01-27: qty 50

## 2024-01-27 MED ORDER — INSULIN ASPART 100 UNIT/ML IJ SOLN
0.0000 [IU] | INTRAMUSCULAR | Status: DC | PRN
  Administered 2024-01-28: 5 [IU] via SUBCUTANEOUS
  Filled 2024-01-27: qty 5

## 2024-01-27 MED ORDER — DEXTROSE 50 % IV SOLN
12.5000 g | INTRAVENOUS | Status: DC | PRN
  Administered 2024-01-31: 12.5 g via INTRAVENOUS
  Filled 2024-01-27 (×3): qty 50

## 2024-01-27 MED ORDER — INSULIN ASPART 100 UNIT/ML IJ SOLN: 0.0000 [IU] | INTRAMUSCULAR | Status: DC

## 2024-01-27 MED ORDER — MAGNESIUM SULFATE 2 GM/50ML IV SOLN
2.0000 g | Freq: Once | INTRAVENOUS | Status: AC
  Administered 2024-01-27: 2 g via INTRAVENOUS
  Filled 2024-01-27: qty 50

## 2024-01-27 MED ORDER — INSULIN GLARGINE-YFGN 100 UNIT/ML ~~LOC~~ SOLN
6.0000 [IU] | Freq: Two times a day (BID) | SUBCUTANEOUS | Status: DC
  Administered 2024-01-27 – 2024-01-28 (×2): 6 [IU] via SUBCUTANEOUS
  Filled 2024-01-27 (×4): qty 0.06

## 2024-01-27 MED ORDER — DEXTROSE 50 % IV SOLN: 50.0000 mL | INTRAVENOUS | Status: DC

## 2024-01-27 MED ORDER — POTASSIUM CHLORIDE 10 MEQ/100ML IV SOLN
10.0000 meq | INTRAVENOUS | Status: AC
  Administered 2024-01-27 (×6): 10 meq via INTRAVENOUS
  Filled 2024-01-27 (×5): qty 100

## 2024-01-27 NOTE — Progress Notes (Signed)
 Nephrology Follow-Up Consult note   Assessment/Recommendations: Sherri Todd is a/an 63 y.o. 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  Likely secondary to shock with some degree of tubular injury. Improving -Continue with supportive care including pressors as needed -IVFs PRN -Continue to monitor daily Cr, Dose meds for GFR -Monitor Daily I/Os, Daily weight  -Maintain MAP>65 for optimal renal perfusion.  -Avoid nephrotoxic medications including NSAIDs -Use synthetic opioids (Fentanyl /Dilaudid ) if needed   Severe septic shock: Associated with ischemic bowel.  Status post surgery as below.  Continue pressors and antibiotics per primary team    Ischemic bowel: Associated with volvulus.  Also with ischemic pancreas.  Status postsurgical resection.  Surgery following.  Unable to tolerate p.o. at this time   History of kidney/pancreas transplant: In 1999.  Followed by [facility].  Hold home mycophenolate  given critical illness and restart as she stabilizes.  Continue tacrolimus  as sublingual.  Trough was 3.7 which is near goal of 4.  No need to change dose at this time.  Switch to tablet formulation when taking p.o. continue solumedrol 10mg  -> switch to oral prednisone  5mg  daily when able. Restart MMF when tolerating p.o.   Blood loss anemia: Associated with surgery.  Transfuse as needed   Recommendations conveyed to primary service.    [facility] 01/27/2024 7:29 AM  ___________________________________________________________  CC: Nausea and vomiting  Interval History/Subjective: Patient continues to have vomiting as her main complaint.  Denies any issues urinating.  Good urine output.  Creatinine near baseline  now  Medications:  Current Facility-Administered Medications  Medication Dose Route Frequency Provider Last Rate Last Admin   0.9 %  sodium chloride  infusion   Intravenous PRN Vicci Sor R, PA-C       0.9 %  sodium chloride  infusion   Intravenous PRN Johnson, Kelly R, PA-C   Stopped at 01/23/24 1225   0.9 %  sodium chloride  infusion   Intravenous PRN Johnson, Kelly R, PA-C   Stopped at 01/23/24 1224   acetaminophen  (OFIRMEV ) IV 1,000 mg  1,000 mg Intravenous Q6H Johnson, Kelly R, PA-C 400 mL/hr at 01/27/24 0601 1,000 mg at 01/27/24 0601   albuterol  (PROVENTIL ) (2.5 MG/3ML) 0.083% nebulizer solution 2.5 mg  2.5 mg Nebulization Q4H PRN Johnson, Kelly R, PA-C       Chlorhexidine  Gluconate Cloth 2 % PADS 6 each  6 each Topical Daily Johnson, Kelly R, PA-C   6 each at 01/26/24 9060   heparin  injection 5,000 Units  5,000 Units Subcutaneous Q8H Johnson, Kelly R, PA-C   5,000 Units at 01/27/24 9441   hydrALAZINE  (APRESOLINE ) injection 10 mg  10 mg Intravenous Q6H PRN Gretta Doffing P, DO       HYDROmorphone  (DILAUDID ) injection 0.5-2 mg  0.5-2 mg Intravenous Q2H PRN Johnson, Kelly R, PA-C       insulin  aspart (novoLOG ) injection 0-6 Units  0-6 Units Subcutaneous Q4H Samtani, Jai-Gurmukh, MD   3 Units at 01/27/24 0449   insulin  glargine-yfgn (SEMGLEE ) injection 4 Units  4 Units Subcutaneous BID Samtani, Jai-Gurmukh, MD   4 Units at 01/26/24 2230   labetalol  (NORMODYNE ) injection 10 mg  10 mg Intravenous Q2H PRN Gretta Doffing P, DO   10 mg at 01/25/24 1148   methylPREDNISolone  sodium succinate  (SOLU-MEDROL ) 40  mg/mL injection 10 mg  10 mg Intravenous Daily Zealand Boyett J, MD       ondansetron  (ZOFRAN ) injection 4 mg  4 mg Intravenous Q6H PRN Samtani, Jai-Gurmukh, MD   4 mg at 01/26/24 2059   Oral care mouth rinse  15 mL Mouth Rinse PRN Vicci Burnard SAUNDERS, PA-C       Oral care mouth rinse  15 mL Mouth Rinse PRN Gretta Doffing P, DO       pantoprazole  (PROTONIX ) injection 40 mg  40 mg Intravenous Daily  Johnson, Kelly R, PA-C   40 mg at 01/26/24 9070   piperacillin -tazobactam (ZOSYN ) IVPB 3.375 g  3.375 g Intravenous Q8H Jimenez, Aileen, RPH 12.5 mL/hr at 01/27/24 0606 3.375 g at 01/27/24 0606   potassium chloride  10 mEq in 100 mL IVPB  10 mEq Intravenous Q1 Hr x 6 Sundil, Subrina, MD 100 mL/hr at 01/27/24 0702 10 mEq at 01/27/24 9297   prochlorperazine  (COMPAZINE ) injection 10 mg  10 mg Intravenous Q6H PRN Samtani, Jai-Gurmukh, MD   10 mg at 01/27/24 9662   sodium chloride  flush (NS) 0.9 % injection 10-40 mL  10-40 mL Intracatheter Q12H Samtani, Jai-Gurmukh, MD   10 mL at 01/26/24 2115   sodium chloride  flush (NS) 0.9 % injection 10-40 mL  10-40 mL Intracatheter PRN Samtani, Jai-Gurmukh, MD       tacrolimus  (PROGRAF ) capsule 0.5 mg  0.5 mg Sublingual BID Vicci Burnard R, PA-C   0.5 mg at 01/26/24 2236   TPN ADULT (ION)   Intravenous Continuous TPN Ciro Harlene NOVAK, RPH 30 mL/hr at 01/27/24 0445 Infusion Verify at 01/27/24 0445      Review of Systems: 12 systems reviewed and negative except per HPI  Physical Exam: Vitals:   01/27/24 0009 01/27/24 0409  BP: (!) 145/71 (!) 148/74  Pulse: 75 78  Resp: 20 19  Temp: 98.2 F (36.8 C) 98.1 F (36.7 C)  SpO2: 98% 98%   No intake/output data recorded.  Intake/Output Summary (Last 24 hours) at 01/27/2024 9270 Last data filed at 01/27/2024 0445 Gross per 24 hour  Intake 1207 ml  Output 4050 ml  Net -2843 ml   Constitutional: ill-appearing, no acute distress ENMT: ears and nose without scars or lesions, MMM CV: normal rate, no edema Respiratory: Bilateral chest rise, no increased work of breathing Gastrointestinal: Surgical incision present, NG tube in place Skin: no visible lesions or rashes Psych: Awake and alert, answers some questions   Test Results I personally reviewed new and old clinical labs and radiology tests Lab Results  Component Value Date   NA 143 01/27/2024   K 2.6 (LL) 01/27/2024   CL 101 01/27/2024   CO2 31  01/27/2024   BUN 28 (H) 01/27/2024   CREATININE 0.98 01/27/2024   CALCIUM  8.5 (L) 01/27/2024   ALBUMIN  2.4 (L) 01/27/2024   PHOS 2.1 (L) 01/27/2024    CBC Recent Labs  Lab 01/21/24 2248 01/21/24 2323 01/25/24 0458 01/26/24 0334 01/27/24 0545  WBC 16.3*   < > 20.5* 11.8* 12.8*  NEUTROABS 14.3*  --   --   --   --   HGB 13.3   < > 8.5* 8.5* 9.0*  HCT 39.7   < > 24.8* 25.6* 26.6*  MCV 107.9*   < > 102.1* 101.2* 99.6  PLT 214   < > 131* 133* 109*   < > = values in this interval not displayed.

## 2024-01-27 NOTE — Plan of Care (Addendum)
  Patient blood glucose is 56.  Give increased dose bolus.   Her blood glucose was around upper 200-300 range throughout the day.  Currently patient is on Semglee  6 unit twice daily and medium sliding scale 0 to 9 unit every 4 hour.  -Due to hypoglycemia holding long-acting insulin  tonight. -Continue hypoglycemia protocol. -As patient was hyperglycemic throughout the day sliding scale was changed low to nedium sliding scale however given patient is developing hypoglycemia now changing back to low sliding scale 0 to 6 unit every 4 hour as needed.  Update, blood glucose improved 56-134 after dextrose  bolus.  Continue to monitor both for development of hypo or hyperglycemic episodes.   Sherri Kirkwood, MD [facility] 01/27/2024, 8:07 PM

## 2024-01-27 NOTE — Progress Notes (Signed)
 PHARMACY - TOTAL PARENTERAL NUTRITION CONSULT NOTE   Indication: Prolonged ileus  Patient Measurements: Height: 5' 1 (154.9 cm) Weight: 33.2 kg (73 lb 3.1 oz) (nurse aware) IBW/kg (Calculated) : 47.8 TPN AdjBW (KG): 51.6 Body mass index is 13.83 kg/m. Usual Weight: 49 kg  Assessment: 63 years of  age female with history of kidney and pancreas transplant 12/1997 followed at [facility] admitted for abdominal pain, nausea, and hypotension found to have internal hernia with small bowel volvulus and necrosis of terminal ileum and ascending colon requiring emergent laparotomy for resection of distal ileum and ascending colon, explant of transplanted pancreas, and ABThera VAC placement on 11/17 and returned for abdominal washout, ileocolic anastomosis, and abdominal closure 11/18. Patient was extubated 11/19. Patient is having bowel function but also nausea and vomiting with likely ileus. NGT placed to low-intermittent suction and patient made NPO. Pharmacy consulted for TPN.   Patient has a noted intolerance to soy as nausea - we will use SMOFlipids, which of our two lipid choices, has the least amount of soybean oil and monitor tolerance.   Glucose / Insulin : CBG 293-390 on semglee  4 BID + very sensitive SSI q4h. Used 18 units of SSI in last 24 hours. Steroids reduced to Solumedrol 10mg  daily on 11/21 (significant decrease). Monitoring patient response closely in setting of removed transplanted pancreas.  Electrolytes: Na 143, K low 2.6 (Stopped IVF w/ K; 60 mEq replacement ordered by MD) , Phos down 2.1, Mg 1.8, CoCa 9.8 Renal: AKI resolving, SCr trending down at 0.98, BUN down 28.  Hepatic: LFTs/Tbili/TG within normal limits. Albumin  2.5.  Intake / Output; MIVF: UOP up 3500 mL (4.4  mL/kg/hr). NGT output 550 mL. ABThera VAC removed. Stool x4 (large, type 7) on 11/20. IVF discontinued.  GI Imaging: 11/17 KUB - dilated small bowel loops consistent with obstruction 11/17 CT Abd - distal small bowel  obstruction d/t mesenteric volvulus, no free gas, trace ascites 11/21 KUB - pending GI Surgeries / Procedures:  11/17 Exlap resection of distal ileum and ascending colon, explant of transplanted pancreas, and ABThera VAC placement  11/18 abdominal washout, ileocolic anastomosis, and abdominal closure  Central access: PICC pending  TPN start date: 01/26/24  Nutritional Goals: Goal TPN rate is 65 mL/hr (provides 83 g of protein and 1587 kcals per day)  RD Assessment: Estimated Needs Total Energy Estimated Needs: 1500-1700 kcals Total Protein Estimated Needs: 75-90 g Total Fluid Estimated Needs: 1.5L  Current Nutrition:  NPO and TPN *Will need pancreatic enzymes once taking enteral diet  Plan:  Continue TPN at 30 mL/hr at 1800- providing ~49% of needs. No advancement D2 of TPN due to uncontrolled CBGs and electrolytes in setting of high refeeding risk and cautious glucose monitoring with removal of transplanted pancreas. *This TPN provides ~108g dextrose .  Electrolytes in TPN: Na 60mEq/L, K 70 mEq/L, Ca 15mEq/L, Mg 25mEq/L, and Phos 20mmol/L. Cl:Ac 1:1 Add standard MVI and trace elements to TPN Add Thiamine to TPN -D1/7 Increase to Sensitive q4h SSI.  Increase Semglee  to 6 units BID -congruent with Diabetic coordinator recommendations. Will adjust as needed.  Give KPhos 15 mmol IV x1 today in addition to MD ordered KCl 60mEq IV.  Give Magnesium  2g IV x1.  Monitor TPN labs on Mon/Thurs, daily electrolytes for next 3 days.  Recheck lytes this PM.    Harlene Boga, PharmD, BCPS, BCCCP Please refer to [facility] for [facility] Pharmacy numbers 01/27/2024,7:39 AM

## 2024-01-27 NOTE — Progress Notes (Addendum)
 Pt c/o burning pain when she urinates, has a foley catheter states that she has felt burning with urination all day but did not mention it to day time RN or provider. Assessed foley cath and saw that tubing was kinked and bed was wet , upon unkinking the line 1450 output was obtained and pt felt immediate relief, provider notified of the above, see new orders. Pt states she no longer feels a burning sensation. Urine was clear, yellow and without odor, see related flowsheets.

## 2024-01-27 NOTE — Progress Notes (Addendum)
 Physical Therapy Treatment Patient Details Name: Sherri Todd MRN: [id number] DOB: 1961/01/30 Today's Date: 01/27/2024   History of Present Illness Pt is a 63 y.o female presented to ED 11/17 for R sided abdominal pain. CT showed small bowel obstruction. Intubated 11/17 for S/p ex lap, resection of distal ileum and ascending colon, explant of transplanted pancreas, temporary closure. 11/18 S/p reopening of prior laparotomy,  ileocolonic anastamosis at proximal transverse colon, suture repair upon closure of umbilical hernia. Extubated 11/19. PMH:  kidney-pancreas transplant 1999, HTN, CVA, DM    PT Comments  Pt resting in bed on arrival, agreeable to session and demonstrating steady progress towards acute goals. Pt requiring min A  +2 to complete bed mobility, transfers sit<>stand and step pivot transfer EOB>recliner. Pt standing from recliner on second stand with min A with cues for hand placement. Pt able to progress short in room ambulation with min A to maintain balance and +2 for chair follow for safety as pt quick to fatigue. Pt received on 2L with SpO2 98% at rest, ambulating on RA with SpO2 94%, 2L replaced at end of session for comfort. Pt agreeable to time up in chair at end of session and receptive to education on benefits of short frequent mobilization to maximize functional mobility gains. Pt continues to benefit from skilled PT services to progress toward functional mobility goals.      If plan is discharge home, recommend the following: Assist for transportation;Assistance with cooking/housework;Help with stairs or ramp for entrance;A little help with walking and/or transfers   Can travel by private vehicle        Equipment Recommendations  BSC/3in1;Rolling walker (2 wheels)    Recommendations for Other Services       Precautions / Restrictions Precautions Precautions: Fall;Other (comment) Recall of Precautions/Restrictions: Impaired Precaution/Restrictions Comments:  Abdominal for comfort, recal pouch, NGT, foley Restrictions Weight Bearing Restrictions Per Provider Order: No     Mobility  Bed Mobility Overal bed mobility: Needs Assistance Bed Mobility: Supine to Sit     Supine to sit: Min assist, +2 for physical assistance     General bed mobility comments: Min A for trunk to midline and +2 for safety/comfort    Transfers Overall transfer level: Needs assistance Equipment used: Rolling walker (2 wheels) Transfers: Sit to/from Stand, Bed to chair/wheelchair/BSC Sit to Stand: Min assist, +2 safety/equipment, +2 physical assistance   Step pivot transfers: Min assist, +2 physical assistance, +2 safety/equipment       General transfer comment: light minA +2 to come to stand from EOB, fading to min A from recliner, min A +2 to steady during trasnfer EOB>chair    Ambulation/Gait Ambulation/Gait assistance: Min assist, +2 safety/equipment (chair follow) Gait Distance (Feet): 12 Feet Assistive device: Rolling walker (2 wheels) Gait Pattern/deviations: Step-to pattern, Decreased stride length, Shuffle Gait velocity: decr     General Gait Details: pt taking very short shuffling steps. pt stopping inermittently every few steps, chair follow for safety   Stairs             Wheelchair Mobility     Tilt Bed    Modified Dau (Stroke Patients Only)       Balance Overall balance assessment: Needs assistance Sitting-balance support: Bilateral upper extremity supported, Feet supported Sitting balance-Leahy Scale: Poor Sitting balance - Comments: Close CGA   Standing balance support: No upper extremity supported, During functional activity Standing balance-Leahy Scale: Poor Standing balance comment: reliant on UE support  Communication Communication Communication: No apparent difficulties  Cognition Arousal: Alert Behavior During Therapy: Flat affect   PT - Cognitive impairments: No  apparent impairments                         Following commands: Intact      Cueing Cueing Techniques: Verbal cues  Exercises      General Comments General comments (skin integrity, edema, etc.): spouse present and supportive throughout session      Pertinent Vitals/Pain Pain Assessment Pain Assessment: Faces Faces Pain Scale: Hurts a little bit Pain Location: Abdomen Pain Descriptors / Indicators: Discomfort, Grimacing Pain Intervention(s): Monitored during session, Limited activity within patient's tolerance, Repositioned    Home Living                          Prior Function            PT Goals (current goals can now be found in the care plan section) Acute Rehab PT Goals Patient Stated Goal: to improve mobility and decrease pain PT Goal Formulation: With patient Time For Goal Achievement: 02/08/24 Progress towards PT goals: Progressing toward goals    Frequency    Min 3X/week      PT Plan      Co-evaluation              AM-PAC PT 6 Clicks Mobility   Outcome Measure  Help needed turning from your back to your side while in a flat bed without using bedrails?: A Lot Help needed moving from lying on your back to sitting on the side of a flat bed without using bedrails?: A Lot Help needed moving to and from a bed to a chair (including a wheelchair)?: Total Help needed standing up from a chair using your arms (e.g., wheelchair or bedside chair)?: Total Help needed to walk in hospital room?: Total Help needed climbing 3-5 steps with a railing? : Total 6 Click Score: 8    End of Session Equipment Utilized During Treatment: Oxygen Activity Tolerance: Patient tolerated treatment well Patient left: in chair;with call bell/phone within reach;with family/visitor present;with chair alarm set Nurse Communication: Mobility status PT Visit Diagnosis: Unsteadiness on feet (R26.81);Other abnormalities of gait and mobility (R26.89);Muscle  weakness (generalized) (M62.81);Pain Pain - part of body:  (abdomen)     Time: 9053-8990 PT Time Calculation (min) (ACUTE ONLY): 23 min  Charges:    $Therapeutic Activity: 23-37 mins PT General Charges $$ ACUTE PT VISIT: 1 Visit                     Trayden Brandy R. PTA Acute Rehabilitation Services Office: [phone]   Therisa CHRISTELLA Boor 01/27/2024, 1:03 PM

## 2024-01-27 NOTE — Progress Notes (Addendum)
 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 Follows for depression   Chronology  11/17 presented abdominal pain nausea hypotension pressors needed AKI creatinine 1.4-->2.2 emergent laparotomy for small bowel obstruction secondary to mesenteric volvulus 1118 return 2 OR anastomosis closure 11/19 extubated 11/21 PICC line placed TPN started   Assessment  & Plan :    Septic shock secondary to peritonitis necrosis and mesenteric volvulus in the setting of immunocompromise Post-op ileus Per Gen surg-continues currently Zosyn  white count is responding defer duration to general surgery Patient does not currently requiring IV Tylenol  every 6 nor Dilaudid  per her discussion with me today She has had no further nausea vomiting in the setting of ileus seen on x-ray yesterday Hopeful for enteric nutrition soon-TPN started 11/21   Severe hypokalemia probably from losses with diarrhea? Currently getting replacement via TPN as well as runs of potassium X6 Magnesium  2 g given also today recheck labs as per TPN protocol tomorrow Diabetes mellitus type 1 status post transplant on CellCept  tacrolimus  and prednisone  Solu-Cortef  changed by renal to Solu-Medrol  10 daily, continues tacrolimus  sublingually 0.5 twice daily Diabetes mellitus is now controlled-she is on sensitive sliding scale with 6 units twice daily of Semglee  insulin  and probably will require reinitiation of this long-term Anemia of expected blood loss from surgery-underlying macrocytosis and mild thrombocytopenia Likely dilutional-watch platelets closely Stroke  complicating transplant as above-fully recovered at the time No persisting current focal deficits Uncontrolled hypertension As needed hydralazine  ordered 5 mg IV for blood pressure above 160-intermittently well-controlled AKI on admission secondary to sepsis Resolving well--fluids as above-phosphorus and micronutrient replacement as per pharmacist Depression Npo for now holding home meds Prozac  at this time  Data Reviewed today:  Sodium 143 potassium 2.6 BUN/creatinine 28/0.9 calcium  8.5 magnesium  1.8 WBC 12.8 hemoglobin 9.0 platelet  DVT prophylaxis: Heparin   Status is: Inpatient Inpatient pending resolution    Dispo/Global plan: Unclear at this time has an ileus defer to general surgery   Time 20   Subjective:    coherent pleasant no distress was awoken from sleep but not having pain no fever no chills no nausea passing some flatus no further diarrhea stools  Objective + exam Vitals:   01/27/24 0409 01/27/24 0739 01/27/24 1111 01/27/24 1636  BP: (!) 148/74 (!) 144/76 135/71 (!) 145/70  Pulse: 78 72 77 75  Resp: 19 20 20 20   Temp: 98.1 F (36.7 C) 98.1 F (36.7 C) 97.8 F (36.6 C) 97.7 F (36.5 C)  TempSrc: Oral Oral Oral Oral  SpO2: 98% 96% 98% 93%  Weight: 33.2 kg     Height:       Filed Weights   01/24/24 0500 01/26/24 0422 01/27/24 0409  Weight: 49 kg 37.3 kg 33.2 kg     Examination:   coherent awake alert no distress EOMI NCAT Chest is clear Abdomen is covered with reinforced dressing and slightly distended No lower extremity edema ROM is intact upper lower extremities She is sinus, sinus bradycardia on monitors for  Scheduled Meds:  Chlorhexidine  Gluconate Cloth  6 each Topical Daily   heparin   5,000 Units  Subcutaneous Q8H   insulin  aspart  0-9 Units Subcutaneous Q4H   insulin  glargine-yfgn  6 Units Subcutaneous BID   methylPREDNISolone  (SOLU-MEDROL ) injection  10 mg Intravenous Daily   pantoprazole  (PROTONIX ) IV  40 mg Intravenous Daily    sodium chloride  flush  10-40 mL Intracatheter Q12H   tacrolimus   0.5 mg Sublingual BID   Continuous Infusions:  sodium chloride      sodium chloride  Stopped (01/23/24 1225)   sodium chloride  Stopped (01/23/24 1224)   piperacillin -tazobactam (ZOSYN )  IV Stopped (01/27/24 1654)   TPN ADULT (ION) Stopped (01/27/24 1702)   TPN ADULT (ION) 30 mL/hr at 01/27/24 1719   sodium chloride , sodium chloride , sodium chloride , albuterol , hydrALAZINE , HYDROmorphone  (DILAUDID ) injection, labetalol , ondansetron  (ZOFRAN ) IV, mouth rinse, mouth rinse, prochlorperazine , sodium chloride  flush  Jai-Gurmukh Kwana Ringel, MD  [facility]

## 2024-01-27 NOTE — Plan of Care (Signed)
  Problem: Education: Goal: Knowledge of General Education information will improve Description: Including pain rating scale, medication(s)/side effects and non-pharmacologic comfort measures Outcome: Progressing   Problem: Health Behavior/Discharge Planning: Goal: Ability to manage health-related needs will improve Outcome: Progressing   Problem: Clinical Measurements: Goal: Ability to maintain clinical measurements within normal limits will improve Outcome: Progressing Goal: Will remain free from infection Outcome: Progressing Goal: Diagnostic test results will improve Outcome: Progressing Goal: Respiratory complications will improve Outcome: Progressing Goal: Cardiovascular complication will be avoided Outcome: Progressing   Problem: Activity: Goal: Risk for activity intolerance will decrease Outcome: Progressing   Problem: Nutrition: Goal: Adequate nutrition will be maintained Outcome: Progressing   Problem: Coping: Goal: Level of anxiety will decrease Outcome: Progressing   Problem: Elimination: Goal: Will not experience complications related to bowel motility Outcome: Progressing Goal: Will not experience complications related to urinary retention Outcome: Progressing   Problem: Pain Managment: Goal: General experience of comfort will improve and/or be controlled Outcome: Progressing   Problem: Safety: Goal: Ability to remain free from injury will improve Outcome: Progressing   Problem: Skin Integrity: Goal: Risk for impaired skin integrity will decrease Outcome: Progressing   Problem: Education: Goal: Ability to describe self-care measures that may prevent or decrease complications (Diabetes Survival Skills Education) will improve Outcome: Progressing Goal: Individualized Educational Video(s) Outcome: Progressing   Problem: Coping: Goal: Ability to adjust to condition or change in health will improve Outcome: Progressing   Problem: Fluid  Volume: Goal: Ability to maintain a balanced intake and output will improve Outcome: Progressing   Problem: Health Behavior/Discharge Planning: Goal: Ability to identify and utilize available resources and services will improve Outcome: Progressing Goal: Ability to manage health-related needs will improve Outcome: Progressing   Problem: Metabolic: Goal: Ability to maintain appropriate glucose levels will improve Outcome: Progressing   Problem: Nutritional: Goal: Maintenance of adequate nutrition will improve Outcome: Progressing Goal: Progress toward achieving an optimal weight will improve Outcome: Progressing   Problem: Skin Integrity: Goal: Risk for impaired skin integrity will decrease Outcome: Progressing   Problem: Tissue Perfusion: Goal: Adequacy of tissue perfusion will improve Outcome: Progressing   Problem: Role Relationship: Goal: Method of communication will improve Outcome: Progressing

## 2024-01-27 NOTE — Progress Notes (Signed)
 Progress Note  4 Days Post-Op  Subjective: Cc is nausea, improves with meds. Does say that she is still intermittently spitting up around NGT. Having diarrhea via rectal pouch.   Objective: Vital signs in last 24 hours: Temp:  [97.5 F (36.4 C)-98.2 F (36.8 C)] 98.1 F (36.7 C) (11/22 0739) Pulse Rate:  [72-84] 72 (11/22 0739) Resp:  [19-20] 20 (11/22 0739) BP: (144-182)/(64-100) 144/76 (11/22 0739) SpO2:  [96 %-98 %] 96 % (11/22 0739) Weight:  [33.2 kg] 33.2 kg (11/22 0409) Last BM Date : 01/25/24  Intake/Output from previous day: 11/21 0701 - 11/22 0700 In: 1207 [I.V.:887.7; NG/GT:30; IV Piggyback:289.3] Out: 4050 [Urine:3500; Emesis/NG output:550] Intake/Output this shift: No intake/output data recorded.  PE: General: WD, WN female,  Heart: regular, rate, and rhythm. No lower extremity edema  Lungs: normal effort Abd: soft, minimal distention, NGT with thin bilious drainage, midline dressing c/d/i GU: foley present with straw colored urine     Lab Results:  Recent Labs    01/26/24 0334 01/27/24 0545  WBC 11.8* 12.8*  HGB 8.5* 9.0*  HCT 25.6* 26.6*  PLT 133* 109*   BMET Recent Labs    01/26/24 0334 01/27/24 0545  NA 140 143  K 2.7* 2.6*  CL 102 101  CO2 27 31  GLUCOSE 314* 295*  BUN 47* 28*  CREATININE 1.25* 0.98  CALCIUM  8.6* 8.5*   PT/INR No results for input(s): LABPROT, INR in the last 72 hours.  CMP     Component Value Date/Time   NA 143 01/27/2024 0545   K 2.6 (LL) 01/27/2024 0545   CL 101 01/27/2024 0545   CO2 31 01/27/2024 0545   GLUCOSE 295 (H) 01/27/2024 0545   BUN 28 (H) 01/27/2024 0545   CREATININE 0.98 01/27/2024 0545   CALCIUM  8.5 (L) 01/27/2024 0545   PROT 4.1 (L) 01/22/2024 0908   ALBUMIN  2.4 (L) 01/27/2024 0545   AST 36 01/22/2024 0908   ALT 14 01/22/2024 0908   ALKPHOS 28 (L) 01/22/2024 0908   BILITOT 1.0 01/22/2024 0908   GFRNONAA >60 01/27/2024 0545   Lipase  No results found for:  LIPASE     Studies/Results: DG CHEST PORT 1 VIEW Result Date: 01/22/2024 EXAM: 1 VIEW(S) XRAY OF THE CHEST 01/22/2024 12:25:00 AM COMPARISON: None available. CLINICAL HISTORY: History of ETT [id number]; Encounter for orogastric (OG) tube placement [id number] FINDINGS: LINES, TUBES AND DEVICES: Endotracheal tube with tip extended 2 cm down the right main bronchus and needs to be withdrawn 5 cm to the mid trachea. Nasogastric tube has been inserted and terminates in the distal body of the stomach. LUNGS AND PLEURA: Vascular markings are normal. No focal pneumonia is seen. No pleural effusion. No pneumothorax. HEART AND MEDIASTINUM: There is mild chronic cardiomegaly. The mediastinum is normally outlined. BONES AND SOFT TISSUES: A bone island is again noted in the anterior right 3rd rib. Slight thoracic dextroscoliosis. IMPRESSION: 1. Endotracheal tube malposition with tip 2 cm into the right main bronchus; recommend withdrawal by 5 cm to the mid trachea. 2. Mild chronic cardiomegaly. 3. No evidence of chf or other acute chest process. 4. These results will be telephoned to the referring provider or the referring providers representative by professional radiology assistant [facility]) personnel, with communication documented in the [facility] dashboard. Electronically signed by: Francis Quam MD 01/22/2024 07:20 AM EST RP Workstation: [id number]   DG Abd Portable 1 View Result Date: 01/22/2024 EXAM: 1 VIEW XRAY OF THE ABDOMEN 01/22/2024 01:49:00 AM COMPARISON: None available. CLINICAL  HISTORY: NG tube placement. FINDINGS: LINES, TUBES AND DEVICES: Nasogastric tube tip overlies the distal body of the stomach. BOWEL: Dilated loops of small bowel were seen throughout the visualized abdomen in keeping with a distal small bowel obstruction. No free air. SOFT TISSUES: No opaque urinary calculi. BONES: No acute osseous abnormality. IMPRESSION: 1. Dilated loops of small bowel consistent with distal small bowel obstruction. No free  air. 2. Nasogastric tube tip overlies the distal body of the stomach. Electronically signed by: Dorethia Molt MD 01/22/2024 02:00 AM EST RP Workstation: [id number]   CT ABDOMEN PELVIS WO CONTRAST Addendum Date: 01/22/2024 ADDENDUM #1 ADDENDUM: The critical findings of this study were discussed directly with Dr. Bari by myself at 1:37 am Electronically signed by: Dorethia Molt MD 01/22/2024 01:37 AM EST RP Workstation: [id number]   Result Date: 01/22/2024  ORIGINAL REPORT  EXAM: CT ABDOMEN AND PELVIS WITHOUT CONTRAST 01/22/2024 01:00:39 AM TECHNIQUE: CT of the abdomen and pelvis was performed without the administration of intravenous contrast. Multiplanar reformatted images are provided for review. Automated exposure control, iterative reconstruction, and/or weight-based adjustment of the mA/kV was utilized to reduce the radiation dose to as low as reasonably achievable. COMPARISON: None available. CLINICAL HISTORY: Abdominal pain, acute, nonlocalized; Sepsis. FINDINGS: LOWER CHEST: Extensive coronary artery calcifications. The distal esophagus is fluid filled with changes of esophageal dysmotility or gastroesophageal reflux. Superimposed circumferential wall thickening may reflect changes of reflux esophagitis. LIVER: The liver is unremarkable. GALLBLADDER AND BILE DUCTS: Gallbladder is unremarkable. No biliary ductal dilatation. SPLEEN: No acute abnormality. PANCREAS: No acute abnormality. ADRENAL GLANDS: No acute abnormality. KIDNEYS, URETERS AND BLADDER: Native kidneys are markedly atrophic in keeping with given history of end-stage renal disease. Transplant kidney noted within the left iliac fossa with small perinephric fluid collection present also representing a postoperative seroma, lymphocele, or chronic hematoma. No significant mass effect on the transplant kidney. No stones in the kidneys or ureters. No hydronephrosis. No perinephric or periureteral stranding. Urinary bladder is unremarkable. GI  AND BOWEL: There is a distal small bowel obstruction present with numerous loops of more proximal dilated, fluid-filled loops of small bowel as well as fluid distention of the stomach and duodenum. There is a whirl appearance of the mesenteric vasculature in keeping with a mesenteric volvulus best appreciated on image 51 - 56, series 3. The point of obstruction occurs just distal to an anastomotic line noted within the mid pelvis. The terminal ileum and colon are decompressed. PERITONEUM AND RETROPERITONEUM: Trace ascites. No free intraperitoneal gas. VASCULATURE: Aorta is normal in caliber. LYMPH NODES: No lymphadenopathy. REPRODUCTIVE ORGANS: Uterus is unremarkable. No adnexal masses. BONES AND SOFT TISSUES: Osseous structures are age appropriate. No acute bone abnormality. No lytic or blastic bone lesion. Small fat-containing umbilical hernia. IMPRESSION: 1. 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 No free intraperitoneal gas. Trace ascites. 2. Changes of esophageal dysmotility or gastroesophageal reflux with superimposed circumferential distal esophageal wall thickening, possibly reflecting reflux esophagitis. 3. Transplant kidney in the left iliac fossa with small perinephric fluid collection, possibly representing a postoperative seroma, lymphocele, or chronic hematoma. No significant mass effect on the transplant kidney. 4. Reported pancreatic transplant is not well appreciated on this examination due to noncontrast technique and superimposed pathology. Electronically signed by: Dorethia Molt MD 01/22/2024 01:31 AM EST RP Workstation: [id number]   DG Chest Port 1 View if patient is in a treatment room. Result Date: 01/22/2024 EXAM: 1 VIEW(S) XRAY OF THE CHEST 01/22/2024 12:46:00 AM COMPARISON: Chest x-ray 05/16/2022,  CT right shoulder 01/17/2015. CLINICAL HISTORY: Suspected Sepsis. FINDINGS: LUNGS AND PLEURA: Stable chronic 6 mm peripheral right upper lobe nodule. No  focal pulmonary opacity. No pleural effusion. No pneumothorax. HEART AND MEDIASTINUM: No acute abnormality of the cardiac and mediastinal silhouettes. BONES AND SOFT TISSUES: No acute osseous abnormality. IMPRESSION: 1. No acute cardiopulmonary process. Electronically signed by: Morgane Naveau MD 01/22/2024 12:55 AM EST RP Workstation: [id number]    Anti-infectives: Anti-infectives (From admission, onward)    Start     Dose/Rate Route Frequency Ordered Stop   01/24/24 1400  piperacillin -tazobactam (ZOSYN ) IVPB 3.375 g        3.375 g 12.5 mL/hr over 240 Minutes Intravenous Every 8 hours 01/24/24 0957     01/22/24 1200  piperacillin -tazobactam (ZOSYN ) IVPB 2.25 g  Status:  Discontinued        2.25 g 100 mL/hr over 30 Minutes Intravenous Every 6 hours 01/22/24 9386 01/24/24 0957   01/21/24 2330  ceFEPIme  (MAXIPIME ) 2 g in sodium chloride  0.9 % 100 mL IVPB        2 g 200 mL/hr over 30 Minutes Intravenous  Once 01/21/24 2316 01/21/24 2356   01/21/24 2330  metroNIDAZOLE  (FLAGYL ) IVPB 500 mg        500 mg 100 mL/hr over 60 Minutes Intravenous  Once 01/21/24 2316 01/22/24 0109        Assessment/Plan  Hx of kidney-pancreas transplant 1999, followed at [facility]  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 Dasie  POD4 ex-lap, abdominal washout, ileocolic anastomosis, abdominal closure Dr. Aron - having bowel function but also n/v overnight - likely ileus, continue NGT to LIWS and NPO - PICC and TPN  - continue abx - BID WTD to midline wound - if ileus does not resolve and WBC trends up then she will likely need a CT scan of the abdomen and pelvis with IV and PO contrast in a couple of days to evaluate for for abscess, leak, etc.    FEN: NPO, NGT to LIWS, TPN started 11/21, hypokalemia (2.6) receiving KCl x 6 and Kphos. Mg 1.8  and received 2 g IV Mag today.  VTE: SQH ID: Zosyn  11/17>> ; WBC 12 - monitor. Noted UA ordered.  - per CCM -  HTN HLD Asthma Hx of CVA  LOS: 5 days    Almarie GORMAN Pringle, [facility] 01/27/2024, 10:04 AM Please see Amion for pager number during day hours 7:00am-4:30pm

## 2024-01-28 DIAGNOSIS — K559 Vascular disorder of intestine, unspecified: Secondary | ICD-10-CM | POA: Diagnosis not present

## 2024-01-28 LAB — RENAL FUNCTION PANEL
Albumin: 2.3 g/dL — ABNORMAL LOW (ref 3.5–5.0)
Anion gap: 10 (ref 5–15)
BUN: 23 mg/dL (ref 8–23)
CO2: 27 mmol/L (ref 22–32)
Calcium: 8.4 mg/dL — ABNORMAL LOW (ref 8.9–10.3)
Chloride: 105 mmol/L (ref 98–111)
Creatinine, Ser: 0.83 mg/dL (ref 0.44–1.00)
GFR, Estimated: >60 mL/min (ref >60)
Glucose, Bld: 116 mg/dL — ABNORMAL HIGH (ref 70–99)
Phosphorus: 2.9 mg/dL (ref 2.5–4.6)
Potassium: 3.4 mmol/L — ABNORMAL LOW (ref 3.5–5.1)
Sodium: 142 mmol/L (ref 135–145)

## 2024-01-28 LAB — BASIC METABOLIC PANEL WITH GFR
Anion gap: 13 (ref 5–15)
BUN: 26 mg/dL — ABNORMAL HIGH (ref 8–23)
CO2: 21 mmol/L — ABNORMAL LOW (ref 22–32)
Calcium: 8.4 mg/dL — ABNORMAL LOW (ref 8.9–10.3)
Chloride: 105 mmol/L (ref 98–111)
Creatinine, Ser: 0.97 mg/dL (ref 0.44–1.00)
GFR, Estimated: >60 mL/min (ref >60)
Glucose, Bld: 261 mg/dL — ABNORMAL HIGH (ref 70–99)
Potassium: 5.4 mmol/L — ABNORMAL HIGH (ref 3.5–5.1)
Sodium: 139 mmol/L (ref 135–145)

## 2024-01-28 LAB — GLUCOSE, CAPILLARY
Glucose-Capillary: 196 mg/dL — ABNORMAL HIGH (ref 70–99)
Glucose-Capillary: 206 mg/dL — ABNORMAL HIGH (ref 70–99)
Glucose-Capillary: 221 mg/dL — ABNORMAL HIGH (ref 70–99)
Glucose-Capillary: 252 mg/dL — ABNORMAL HIGH (ref 70–99)
Glucose-Capillary: 270 mg/dL — ABNORMAL HIGH (ref 70–99)
Glucose-Capillary: 352 mg/dL — ABNORMAL HIGH (ref 70–99)

## 2024-01-28 LAB — MAGNESIUM: Magnesium: 2 mg/dL (ref 1.7–2.4)

## 2024-01-28 LAB — PHOSPHORUS: Phosphorus: 3.9 mg/dL (ref 2.5–4.6)

## 2024-01-28 MED ORDER — TRAVASOL 10 % IV SOLN
INTRAVENOUS | Status: AC
  Filled 2024-01-28: qty 572.4

## 2024-01-28 MED ORDER — INSULIN ASPART 100 UNIT/ML IJ SOLN
0.0000 [IU] | INTRAMUSCULAR | Status: DC
  Administered 2024-01-28: 3 [IU] via SUBCUTANEOUS
  Administered 2024-01-28: 5 [IU] via SUBCUTANEOUS
  Administered 2024-01-28: 3 [IU] via SUBCUTANEOUS
  Administered 2024-01-28: 5 [IU] via SUBCUTANEOUS
  Administered 2024-01-29 (×2): 7 [IU] via SUBCUTANEOUS
  Filled 2024-01-28: qty 5
  Filled 2024-01-28: qty 6
  Filled 2024-01-28: qty 3
  Filled 2024-01-28: qty 4
  Filled 2024-01-28 (×2): qty 3
  Filled 2024-01-28: qty 5
  Filled 2024-01-28: qty 3

## 2024-01-28 MED ORDER — INSULIN GLARGINE-YFGN 100 UNIT/ML ~~LOC~~ SOLN
2.0000 [IU] | Freq: Two times a day (BID) | SUBCUTANEOUS | Status: DC
  Administered 2024-01-28 – 2024-01-29 (×2): 2 [IU] via SUBCUTANEOUS
  Filled 2024-01-28 (×3): qty 0.02

## 2024-01-28 MED ORDER — POTASSIUM CHLORIDE 10 MEQ/100ML IV SOLN
10.0000 meq | INTRAVENOUS | Status: AC
  Administered 2024-01-28 (×4): 10 meq via INTRAVENOUS
  Filled 2024-01-28 (×4): qty 100

## 2024-01-28 MED ORDER — INSULIN ASPART 100 UNIT/ML IJ SOLN: 0.0000 [IU] | INTRAMUSCULAR | Status: DC

## 2024-01-28 NOTE — Progress Notes (Signed)
 PHARMACY - TOTAL PARENTERAL NUTRITION CONSULT NOTE   Indication: Prolonged ileus  Patient Measurements: Height: 5' 1 (154.9 cm) Weight: 43.2 kg (95 lb 3.8 oz) IBW/kg (Calculated) : 47.8 TPN AdjBW (KG): 51.6 Body mass index is 18 kg/m. 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 Patient was extubated 11/19. Patient is having bowel function but also nausea and vomiting with likely ileus. NGT placed to low-intermittent suction and patient made NPO. Pharmacy consulted for TPN.   Patient has a noted intolerance to soy as nausea - we will use SMOFlipids, which of our two lipid choices, has the least amount of soybean oil and monitor tolerance.   Glucose / Insulin : CBG 56-196 on semglee  6 BID + sensitive SSI q4h. Used 17 units of SSI in last 24 hours (by charting gave correction around 1400 and then again at 1648 causing some stacking and low CBG). Steroids reduced to Solumedrol 10mg  daily on 11/21 (significant decrease). Monitoring patient response closely in setting of removed transplanted pancreas.  Electrolytes: Na 142, K 3.4 (goal >=4), Phos 2.9, Mg 2, CoCa 9.8 Renal: AKI resolved, SCr  0.83, BUN 23.  Hepatic: LFTs/Tbili/TG within normal limits. Albumin  2.3.  Intake / Output; MIVF: UOP 1200 mL (1.2 mL/kg/hr). NGT output 400 mL. ABThera VAC removed. Stool 200 mL. IVF discontinued.  GI Imaging: 11/17 KUB - dilated small bowel loops consistent with obstruction 11/17 CT Abd - distal small bowel obstruction d/t mesenteric  volvulus, no free gas, trace ascites 11/21 KUB - pending GI Surgeries / Procedures:  11/17 Exlap resection of distal ileum and ascending colon, explant of transplanted pancreas, and ABThera VAC placement  11/18 abdominal washout, ileocolic anastomosis, and abdominal closure  Central access: PICC pending  TPN start date: 01/26/24  Nutritional Goals: Goal TPN rate is 65 mL/hr (provides 83 g of protein and 1587 kcals per day)  RD Assessment: Estimated Needs Total Energy Estimated Needs: 1500-1700 kcals Total Protein Estimated Needs: 75-90 g Total Fluid Estimated Needs: 1.5L  Current Nutrition:  NPO and TPN *Will need pancreatic enzymes once taking enteral diet  Plan:  Increase TPN to 45 mL/hr at 1800- providing ~70% of needs. Slow advancement of TPN due to uncontrolled CBGs and electrolytes in setting of high refeeding risk and cautious glucose monitoring with removal of transplanted pancreas. *This TPN provides ~162g dextrose .  Electrolytes in TPN: Na 27mEq/L, K 70 mEq/L, Ca 34mEq/L, Mg 1mEq/L, and Phos 20mmol/L. Cl:Ac 1:1 Add standard MVI and trace elements to TPN Add Thiamine to TPN -D2/7 Continue very Sensitive q4h SSI.  Continue Semglee  to 6 units BID -*as going up on dextrose  in TPN, congruent with Diabetic coordinator recommendations. Will adjust as needed.  Give KCl 10 mEq IV x4 today.  Monitor TPN labs on Mon/Thurs, daily electrolytes for next 3 days.  Recheck lytes this PM.    Harlene Boga, PharmD, BCPS, BCCCP Please refer to [facility] for [facility] numbers 01/28/2024,7:25 AM

## 2024-01-28 NOTE — Progress Notes (Signed)
 Nephrology Follow-Up Consult note   Assessment/Recommendations: Sherri Todd is a/an 63 y.o. 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  Likely secondary to shock with some degree of tubular injury. Improving -Continue to monitor daily Cr, Dose meds for GFR -Monitor Daily I/Os, Daily weight  -Maintain MAP>65 for optimal renal perfusion.  -Avoid nephrotoxic medications including NSAIDs -Use synthetic opioids (Fentanyl /Dilaudid ) if needed   Severe septic shock: Associated with ischemic bowel.  Status post surgery as below.  Continue pressors and antibiotics per primary team    Ischemic bowel: Associated with volvulus.  Also with ischemic pancreas.  Status postsurgical resection.  Surgery following.  Unable to tolerate p.o. at this time   History of kidney/pancreas transplant: In 1999.  Followed by [facility].  Hold home mycophenolate  given critical illness and restart as she stabilizes.  Continue tacrolimus  as sublingual.  Trough was 3.7 which is near goal of 4.  No need to change dose at this time.  Switch to tablet formulation when taking p.o. Continue solumedrol 10mg  -> switch to oral prednisone  5mg  daily when able. Could restart MMF as IV if she continues to not tolerate PO. WBC count is improving so maybe can start tomorrow. Need to confirm home dose (husband wasn't here today)   Blood loss anemia: Associated with surgery.  Transfuse as needed   Recommendations conveyed to primary service.    [facility] 01/28/2024 9:13 AM  ___________________________________________________________  CC: Nausea and vomiting  Interval History/Subjective: Patient continues to have nausea but otherwise no complaints. Not tolerating  PO.  Medications:  Current Facility-Administered Medications  Medication Dose Route Frequency Provider Last Rate Last Admin   0.9 %  sodium chloride  infusion   Intravenous PRN Vicci Sor R, PA-C       0.9 %  sodium chloride  infusion   Intravenous PRN Johnson, Kelly R, PA-C   Stopped at 01/23/24 1225   0.9 %  sodium chloride  infusion   Intravenous PRN Johnson, Kelly R, PA-C   Stopped at 01/23/24 1224   albuterol  (PROVENTIL ) (2.5 MG/3ML) 0.083% nebulizer solution 2.5 mg  2.5 mg Nebulization Q4H PRN Johnson, Kelly R, PA-C       Chlorhexidine  Gluconate Cloth 2 % PADS 6 each  6 each Topical Daily Johnson, Kelly R, PA-C   6 each at 01/28/24 9188   dextrose  50 % solution 12.5 g  12.5 g Intravenous PRN Sundil, Subrina, MD       heparin  injection 5,000 Units  5,000 Units Subcutaneous Q8H Johnson, Kelly R, PA-C   5,000 Units at 01/28/24 9372   hydrALAZINE  (APRESOLINE ) injection 10 mg  10 mg Intravenous Q6H PRN Gretta Doffing P, DO       HYDROmorphone  (DILAUDID ) injection 0.5-2 mg  0.5-2 mg Intravenous Q2H PRN Johnson, Kelly R, PA-C       insulin  aspart (novoLOG ) injection 0-6 Units  0-6 Units Subcutaneous Q4H PRN Sundil, Subrina, MD   5 Units at 01/28/24 0825   insulin  glargine-yfgn (SEMGLEE ) injection 6 Units  6 Units Subcutaneous BID Millen, Jessica B, RPH   6 Units at 01/28/24 0810   labetalol  (NORMODYNE ) injection 10 mg  10 mg Intravenous Q2H PRN Gretta Doffing P, DO   10 mg at 01/25/24 1148   methylPREDNISolone  sodium succinate  (SOLU-MEDROL )  40 mg/mL injection 10 mg  10 mg Intravenous Daily Macel Jayson PARAS, MD   10 mg at 01/28/24 0810   ondansetron  (ZOFRAN ) injection 4 mg  4 mg Intravenous Q6H PRN Samtani, Jai-Gurmukh, MD   4 mg at 01/28/24 0810   Oral care mouth rinse  15 mL Mouth Rinse PRN Vicci Burnard SAUNDERS, PA-C       Oral care mouth rinse  15 mL Mouth Rinse PRN Gretta Doffing P, DO       pantoprazole  (PROTONIX ) injection 40 mg  40 mg Intravenous Daily Johnson, Kelly R, PA-C   40 mg at 01/28/24  0810   piperacillin -tazobactam (ZOSYN ) IVPB 3.375 g  3.375 g Intravenous Q8H Jimenez, Aileen, RPH 12.5 mL/hr at 01/28/24 0628 3.375 g at 01/28/24 9371   potassium chloride  10 mEq in 100 mL IVPB  10 mEq Intravenous Q1 Hr x 4 Millen, Jessica B, RPH 100 mL/hr at 01/28/24 0829 10 mEq at 01/28/24 9170   prochlorperazine  (COMPAZINE ) injection 10 mg  10 mg Intravenous Q6H PRN Samtani, Jai-Gurmukh, MD   10 mg at 01/27/24 9662   sodium chloride  flush (NS) 0.9 % injection 10-40 mL  10-40 mL Intracatheter Q12H Samtani, Jai-Gurmukh, MD   10 mL at 01/28/24 9188   sodium chloride  flush (NS) 0.9 % injection 10-40 mL  10-40 mL Intracatheter PRN Samtani, Jai-Gurmukh, MD       tacrolimus  (PROGRAF ) capsule 0.5 mg  0.5 mg Sublingual BID Vicci Burnard R, PA-C   0.5 mg at 01/28/24 0810   TPN ADULT (ION)   Intravenous Continuous TPN Ciro Harlene NOVAK, RPH 30 mL/hr at 01/27/24 2335 Rate Verify at 01/27/24 2335      Review of Systems: 12 systems reviewed and negative except per HPI  Physical Exam: Vitals:   01/28/24 0420 01/28/24 0811  BP:  (!) 152/77  Pulse: 79 82  Resp: (!) 21 19  Temp:  97.6 F (36.4 C)  SpO2: 93% 92%   No intake/output data recorded.  Intake/Output Summary (Last 24 hours) at 01/28/2024 0913 Last data filed at 01/28/2024 9371 Gross per 24 hour  Intake 2158.27 ml  Output 1800 ml  Net 358.27 ml   Constitutional: ill-appearing but improved, no acute distress ENMT: ears and nose without scars or lesions, MMM CV: normal rate, no edema Respiratory: Bilateral chest rise, no increased work of breathing Gastrointestinal: Surgical incision present, NG tube in place Skin: no visible lesions or rashes Psych: Awake and alert, answers some questions   Test Results I personally reviewed new and old clinical labs and radiology tests Lab Results  Component Value Date   NA 142 01/28/2024   K 3.4 (L) 01/28/2024   CL 105 01/28/2024   CO2 27 01/28/2024   BUN 23 01/28/2024   CREATININE 0.83  01/28/2024   CALCIUM  8.4 (L) 01/28/2024   ALBUMIN  2.3 (L) 01/28/2024   PHOS 2.9 01/28/2024    CBC Recent Labs  Lab 01/21/24 2248 01/21/24 2323 01/25/24 0458 01/26/24 0334 01/27/24 0545  WBC 16.3*   < > 20.5* 11.8* 12.8*  NEUTROABS 14.3*  --   --   --   --   HGB 13.3   < > 8.5* 8.5* 9.0*  HCT 39.7   < > 24.8* 25.6* 26.6*  MCV 107.9*   < > 102.1* 101.2* 99.6  PLT 214   < > 131* 133* 109*   < > = values in this interval not displayed.

## 2024-01-28 NOTE — Progress Notes (Addendum)
   01/27/24 1945  Provider Notification  Provider Name/Title Dr. CANDIE Speaker  Date Provider Notified 01/27/24  Time Provider Notified 1945  Method of Notification Page  Notification Reason Critical Result;Change in status;Other (Comment) (Pt has hypoglycemic episode, CBG 56, comfirm by checking twice. Hypoglycemia standing order is innitiated, but the order requires MD authorization.50% dextrose  12.5 mg IV given.   Pt has been NPO, on TPN at rate 30 ml/hr.)  Test performed and critical result Bedside CBG 56 mg/dl  Date Critical Result Received 01/27/24  Time Critical Result Received 1945  Provider response See new orders; to hold the long acting insulin  Samglee tonight and change the short acting insulin  sliding scale.  Date of Provider Response 01/27/24  Time of Provider Response 1947   15 minutes after dextrose  12.5 mg IV given, her CBG is 134 mg/dl. She is stable hemodynamically, afebrile, on room air, normal respiration. No distress. Plan of care is reviewed. We will continue to monitor.   Wendi Dash, RN

## 2024-01-28 NOTE — Progress Notes (Signed)
 Progress Note  5 Days Post-Op  Subjective: Cc  Reports feeling stable - no better, no worse  Objective: Vital signs in last 24 hours: Temp:  [97.6 F (36.4 C)-98.3 F (36.8 C)] 97.6 F (36.4 C) (11/23 0811) Pulse Rate:  [75-82] 82 (11/23 0811) Resp:  [19-24] 19 (11/23 0811) BP: (135-152)/(69-78) 152/77 (11/23 0811) SpO2:  [92 %-98 %] 92 % (11/23 0811) Weight:  [43.2 kg] 43.2 kg (11/23 0420) Last BM Date : 01/28/24  Intake/Output from previous day: 11/22 0701 - 11/23 0700 In: 2158.3 [P.O.:300; I.V.:691.8; IV Piggyback:1166.5] Out: 1800 [Urine:1200; Emesis/NG output:400; Stool:200] Intake/Output this shift: No intake/output data recorded.  PE: General: WD, WN female,  Heart: regular, rate, and rhythm. No lower extremity edema  Lungs: normal effort Abd: soft, minimal distention, NGT with thin bilious drainage, midline dressing c/d/i GU: foley present with straw colored urine     Lab Results:  Recent Labs    01/26/24 0334 01/27/24 0545  WBC 11.8* 12.8*  HGB 8.5* 9.0*  HCT 25.6* 26.6*  PLT 133* 109*   BMET Recent Labs    01/27/24 1917 01/28/24 0223  NA 141 142  K 3.5 3.4*  CL 104 105  CO2 31 27  GLUCOSE 75 116*  BUN 23 23  CREATININE 0.82 0.83  CALCIUM  8.6* 8.4*   PT/INR No results for input(s): LABPROT, INR in the last 72 hours.  CMP     Component Value Date/Time   NA 142 01/28/2024 0223   K 3.4 (L) 01/28/2024 0223   CL 105 01/28/2024 0223   CO2 27 01/28/2024 0223   GLUCOSE 116 (H) 01/28/2024 0223   BUN 23 01/28/2024 0223   CREATININE 0.83 01/28/2024 0223   CALCIUM  8.4 (L) 01/28/2024 0223   PROT 4.1 (L) 01/22/2024 0908   ALBUMIN  2.3 (L) 01/28/2024 0223   AST 36 01/22/2024 0908   ALT 14 01/22/2024 0908   ALKPHOS 28 (L) 01/22/2024 0908   BILITOT 1.0 01/22/2024 0908   GFRNONAA >60 01/28/2024 0223   Lipase  No results found for: LIPASE     Studies/Results: DG CHEST PORT 1 VIEW Result Date: 01/22/2024 EXAM: 1 VIEW(S) XRAY OF  THE CHEST 01/22/2024 12:25:00 AM COMPARISON: None available. CLINICAL HISTORY: History of ETT [id number]; Encounter for orogastric (OG) tube placement [id number] FINDINGS: LINES, TUBES AND DEVICES: Endotracheal tube with tip extended 2 cm down the right main bronchus and needs to be withdrawn 5 cm to the mid trachea. Nasogastric tube has been inserted and terminates in the distal body of the stomach. LUNGS AND PLEURA: Vascular markings are normal. No focal pneumonia is seen. No pleural effusion. No pneumothorax. HEART AND MEDIASTINUM: There is mild chronic cardiomegaly. The mediastinum is normally outlined. BONES AND SOFT TISSUES: A bone island is again noted in the anterior right 3rd rib. Slight thoracic dextroscoliosis. IMPRESSION: 1. Endotracheal tube malposition with tip 2 cm into the right main bronchus; recommend withdrawal by 5 cm to the mid trachea. 2. Mild chronic cardiomegaly. 3. No evidence of chf or other acute chest process. 4. These results will be telephoned to the referring provider or the referring providers representative by professional radiology assistant [facility]) personnel, with communication documented in the [facility] dashboard. Electronically signed by: Francis Quam MD 01/22/2024 07:20 AM EST RP Workstation: [id number]   DG Abd Portable 1 View Result Date: 01/22/2024 EXAM: 1 VIEW XRAY OF THE ABDOMEN 01/22/2024 01:49:00 AM COMPARISON: None available. CLINICAL HISTORY: NG tube placement. FINDINGS: LINES, TUBES AND DEVICES: Nasogastric tube tip overlies  the distal body of the stomach. BOWEL: Dilated loops of small bowel were seen throughout the visualized abdomen in keeping with a distal small bowel obstruction. No free air. SOFT TISSUES: No opaque urinary calculi. BONES: No acute osseous abnormality. IMPRESSION: 1. Dilated loops of small bowel consistent with distal small bowel obstruction. No free air. 2. Nasogastric tube tip overlies the distal body of the stomach. Electronically signed by: Dorethia Molt MD 01/22/2024 02:00 AM EST RP Workstation: [id number]   CT ABDOMEN PELVIS WO CONTRAST Addendum Date: 01/22/2024 ADDENDUM #1 ADDENDUM: The critical findings of this study were discussed directly with Dr. Bari by myself at 1:37 am Electronically signed by: Dorethia Molt MD 01/22/2024 01:37 AM EST RP Workstation: [id number]   Result Date: 01/22/2024  ORIGINAL REPORT  EXAM: CT ABDOMEN AND PELVIS WITHOUT CONTRAST 01/22/2024 01:00:39 AM TECHNIQUE: CT of the abdomen and pelvis was performed without the administration of intravenous contrast. Multiplanar reformatted images are provided for review. Automated exposure control, iterative reconstruction, and/or weight-based adjustment of the mA/kV was utilized to reduce the radiation dose to as low as reasonably achievable. COMPARISON: None available. CLINICAL HISTORY: Abdominal pain, acute, nonlocalized; Sepsis. FINDINGS: LOWER CHEST: Extensive coronary artery calcifications. The distal esophagus is fluid filled with changes of esophageal dysmotility or gastroesophageal reflux. Superimposed circumferential wall thickening may reflect changes of reflux esophagitis. LIVER: The liver is unremarkable. GALLBLADDER AND BILE DUCTS: Gallbladder is unremarkable. No biliary ductal dilatation. SPLEEN: No acute abnormality. PANCREAS: No acute abnormality. ADRENAL GLANDS: No acute abnormality. KIDNEYS, URETERS AND BLADDER: Native kidneys are markedly atrophic in keeping with given history of end-stage renal disease. Transplant kidney noted within the left iliac fossa with small perinephric fluid collection present also representing a postoperative seroma, lymphocele, or chronic hematoma. No significant mass effect on the transplant kidney. No stones in the kidneys or ureters. No hydronephrosis. No perinephric or periureteral stranding. Urinary bladder is unremarkable. GI AND BOWEL: There is a distal small bowel obstruction present with numerous loops of more proximal  dilated, fluid-filled loops of small bowel as well as fluid distention of the stomach and duodenum. There is a whirl appearance of the mesenteric vasculature in keeping with a mesenteric volvulus best appreciated on image 51 - 56, series 3. The point of obstruction occurs just distal to an anastomotic line noted within the mid pelvis. The terminal ileum and colon are decompressed. PERITONEUM AND RETROPERITONEUM: Trace ascites. No free intraperitoneal gas. VASCULATURE: Aorta is normal in caliber. LYMPH NODES: No lymphadenopathy. REPRODUCTIVE ORGANS: Uterus is unremarkable. No adnexal masses. BONES AND SOFT TISSUES: Osseous structures are age appropriate. No acute bone abnormality. No lytic or blastic bone lesion. Small fat-containing umbilical hernia. IMPRESSION: 1. 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 No free intraperitoneal gas. Trace ascites. 2. Changes of esophageal dysmotility or gastroesophageal reflux with superimposed circumferential distal esophageal wall thickening, possibly reflecting reflux esophagitis. 3. Transplant kidney in the left iliac fossa with small perinephric fluid collection, possibly representing a postoperative seroma, lymphocele, or chronic hematoma. No significant mass effect on the transplant kidney. 4. Reported pancreatic transplant is not well appreciated on this examination due to noncontrast technique and superimposed pathology. Electronically signed by: Dorethia Molt MD 01/22/2024 01:31 AM EST RP Workstation: [id number]   DG Chest Port 1 View if patient is in a treatment room. Result Date: 01/22/2024 EXAM: 1 VIEW(S) XRAY OF THE CHEST 01/22/2024 12:46:00 AM COMPARISON: Chest x-ray 05/16/2022, CT right shoulder 01/17/2015. CLINICAL HISTORY: Suspected Sepsis. FINDINGS: LUNGS AND PLEURA: Stable  chronic 6 mm peripheral right upper lobe nodule. No focal pulmonary opacity. No pleural effusion. No pneumothorax. HEART AND MEDIASTINUM: No acute  abnormality of the cardiac and mediastinal silhouettes. BONES AND SOFT TISSUES: No acute osseous abnormality. IMPRESSION: 1. No acute cardiopulmonary process. Electronically signed by: Morgane Naveau MD 01/22/2024 12:55 AM EST RP Workstation: [id number]    Anti-infectives: Anti-infectives (From admission, onward)    Start     Dose/Rate Route Frequency Ordered Stop   01/24/24 1400  piperacillin -tazobactam (ZOSYN ) IVPB 3.375 g        3.375 g 12.5 mL/hr over 240 Minutes Intravenous Every 8 hours 01/24/24 0957     01/22/24 1200  piperacillin -tazobactam (ZOSYN ) IVPB 2.25 g  Status:  Discontinued        2.25 g 100 mL/hr over 30 Minutes Intravenous Every 6 hours 01/22/24 9386 01/24/24 0957   01/21/24 2330  ceFEPIme  (MAXIPIME ) 2 g in sodium chloride  0.9 % 100 mL IVPB        2 g 200 mL/hr over 30 Minutes Intravenous  Once 01/21/24 2316 01/21/24 2356   01/21/24 2330  metroNIDAZOLE  (FLAGYL ) IVPB 500 mg        500 mg 100 mL/hr over 60 Minutes Intravenous  Once 01/21/24 2316 01/22/24 0109        Assessment/Plan  Hx of kidney-pancreas transplant 1999, followed at [facility]  Internal hernia with small bowel volvulus and necrosis of terminal ileum and ascending colon POD6 ex-lap, resection of distal ileum and ascending colon, explant of transplanted pancreas, ABThera VAC placement Dr. Dasie  POD5 ex-lap, abdominal washout, ileocolic anastomosis, abdominal closure Dr. Aron - having bowel function but also nausea- likely ileus, NG output 400 mL /24h, stool 200 mL. Start clamp trials today 4 hours clamped, 1 hours to LIWS.  - PICC and TPN  - continue abx - BID WTD to midline wound - if ileus does not resolve and WBC trends up then she will likely need a CT scan of the abdomen and pelvis with IV and PO contrast in a couple of days to evaluate for for abscess, leak, etc.    FEN: NPO, NGT clamp trials as above, TPN started 11/21, hypokalemia improved (3.4 from 2.6 yesterday) VTE: SQH ID: [email or web address]  11/17>> ;  WBC 12 yesterday - monitor. Noted UA negative 11/21  - per TRH -  HTN HLD Asthma Hx of CVA  LOS: 6 days    Almarie GORMAN Pringle, [facility] 01/28/2024, 10:20 AM Please see Amion for pager number during day hours 7:00am-4:30pm

## 2024-01-28 NOTE — Progress Notes (Signed)
 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 for surgery Severe hypokalemia probably from losses with diarrhea? TPN replacement as well as pharmacy to replace magnesium  okay today Diabetes mellitus type 1 status post transplant on CellCept  tacrolimus  and prednisone  Brittle diabetes with hypoglycemia Solu-Cortef  changed by renal to Solu-Medrol  10 daily, continues tacrolimus  sublingually 0.5 twice daily  diabetic coordinator to check for continuous glucometer aim to keep sugars higher so that hypoglycemia can be avoided-changed to Semglee  insulin  2 units twice daily as well as sensitive sliding scale only for now CBGs are now range 206-352 Anemia of expected blood loss from surgery-underlying macrocytosis and mild thrombocytopenia Watch labs periodically Stroke complicating transplant as above-fully recovered at the time No persisting current focal deficits Moderate controlled hypertension As needed hydralazine  ordered 5 mg IV for blood pressure above 160-intermittently well-controlled AKI on  admission secondary to sepsis Resolving well--fluids as above-phosphorus and micronutrient replacement as per pharmacist Depression Npo for now holding home meds Prozac  at this time  Data Reviewed today:   Sodium 142 potassium 3.4 BUN/creatinine 23/0.8 albumin  2.3  DVT prophylaxis: Heparin   Status is: Inpatient Inpatient pending resolution    Dispo/Global plan: Unclear at this time has an ileus defer to general surgery   Time 20   Subjective:   Little bit of pain only per nursing with changing dressings did not need a whole lot of pain meds-clamping trials later on today according to surgery but is feeling a little nauseous now No fever Passing a lot of gas diarrhea has stopped Objective + exam Vitals:   01/28/24 0420 01/28/24 0811 01/28/24 1106 01/28/24 1614  BP:  (!) 152/77 (!) 142/77 135/66  Pulse: 79 82 73 77  Resp: (!) 21 19 20 18   Temp:  97.6 F (36.4 C) 98.6 F (37 C) 98.2 F (36.8 C)  TempSrc:  Oral Oral Oral  SpO2: 93% 92% 93% 100%  Weight: 43.2 kg     Height:       Filed Weights   01/26/24 0422 01/27/24 0409 01/28/24 0420  Weight: 37.3 kg 33.2 kg 43.2 kg     Examination:   coherent awake alert no distress EOMI NCAT Chest is clear Abdomen distended exam deferred No lower extremity edema Euthymic although chronically ill-appearing  Scheduled Meds:  Chlorhexidine  Gluconate Cloth  6 each Topical Daily   heparin   5,000 Units Subcutaneous Q8H   insulin  aspart  0-9 Units Subcutaneous Q4H   insulin  glargine-yfgn  2 Units Subcutaneous BID   methylPREDNISolone  (SOLU-MEDROL ) injection  10 mg Intravenous Daily   pantoprazole  (PROTONIX ) IV  40 mg Intravenous Daily   sodium chloride  flush  10-40 mL Intracatheter Q12H   tacrolimus   0.5 mg Sublingual BID   Continuous Infusions:  sodium chloride      sodium chloride  Stopped (01/23/24 1225)   sodium chloride  Stopped (01/23/24 1224)   piperacillin -tazobactam (ZOSYN )  IV 3.375 g (01/28/24 1406)   TPN ADULT  (ION) 30 mL/hr at 01/27/24 2335   TPN ADULT (ION) 45 mL/hr at 01/28/24 1659   sodium chloride , sodium chloride , sodium chloride , albuterol , dextrose , hydrALAZINE , HYDROmorphone  (DILAUDID ) injection, labetalol , ondansetron  (ZOFRAN ) IV, mouth rinse, mouth rinse, prochlorperazine , sodium chloride  flush  Jai-Gurmukh Kimberely Mccannon, MD  [facility]

## 2024-01-29 DIAGNOSIS — K559 Vascular disorder of intestine, unspecified: Secondary | ICD-10-CM | POA: Diagnosis not present

## 2024-01-29 LAB — GLUCOSE, CAPILLARY
Glucose-Capillary: 208 mg/dL — ABNORMAL HIGH (ref 70–99)
Glucose-Capillary: 306 mg/dL — ABNORMAL HIGH (ref 70–99)
Glucose-Capillary: 330 mg/dL — ABNORMAL HIGH (ref 70–99)
Glucose-Capillary: 381 mg/dL — ABNORMAL HIGH (ref 70–99)
Glucose-Capillary: 408 mg/dL — ABNORMAL HIGH (ref 70–99)
Glucose-Capillary: 422 mg/dL — ABNORMAL HIGH (ref 70–99)

## 2024-01-29 LAB — COMPREHENSIVE METABOLIC PANEL WITH GFR
ALT: 32 U/L (ref 0–44)
AST: 27 U/L (ref 15–41)
Albumin: 2.3 g/dL — ABNORMAL LOW (ref 3.5–5.0)
Alkaline Phosphatase: 53 U/L (ref 38–126)
Anion gap: 10 (ref 5–15)
BUN: 25 mg/dL — ABNORMAL HIGH (ref 8–23)
CO2: 24 mmol/L (ref 22–32)
Calcium: 8.3 mg/dL — ABNORMAL LOW (ref 8.9–10.3)
Chloride: 102 mmol/L (ref 98–111)
Creatinine, Ser: 0.9 mg/dL (ref 0.44–1.00)
GFR, Estimated: >60 mL/min (ref >60)
Glucose, Bld: 300 mg/dL — ABNORMAL HIGH (ref 70–99)
Potassium: 5 mmol/L (ref 3.5–5.1)
Sodium: 136 mmol/L (ref 135–145)
Total Bilirubin: 0.7 mg/dL (ref 0.0–1.2)
Total Protein: 4.9 g/dL — ABNORMAL LOW (ref 6.5–8.1)

## 2024-01-29 LAB — CBC WITH DIFFERENTIAL/PLATELET
Abs Immature Granulocytes: 0.97 K/uL — ABNORMAL HIGH (ref 0.00–0.07)
Basophils Absolute: 0 K/uL (ref 0.0–0.1)
Basophils Relative: 0 %
Eosinophils Absolute: 0.2 K/uL (ref 0.0–0.5)
Eosinophils Relative: 1 %
HCT: 28.1 % — ABNORMAL LOW (ref 36.0–46.0)
Hemoglobin: 9.1 g/dL — ABNORMAL LOW (ref 12.0–15.0)
Immature Granulocytes: 6 %
Lymphocytes Relative: 7 %
Lymphs Abs: 1.2 K/uL (ref 0.7–4.0)
MCH: 34.2 pg — ABNORMAL HIGH (ref 26.0–34.0)
MCHC: 32.4 g/dL (ref 30.0–36.0)
MCV: 105.6 fL — ABNORMAL HIGH (ref 80.0–100.0)
Monocytes Absolute: 0.9 K/uL (ref 0.1–1.0)
Monocytes Relative: 5 %
Neutro Abs: 14.2 K/uL — ABNORMAL HIGH (ref 1.7–7.7)
Neutrophils Relative %: 81 %
Platelets: 170 K/uL (ref 150–400)
RBC: 2.66 MIL/uL — ABNORMAL LOW (ref 3.87–5.11)
RDW: 17.2 % — ABNORMAL HIGH (ref 11.5–15.5)
Smear Review: NORMAL
WBC: 17.4 K/uL — ABNORMAL HIGH (ref 4.0–10.5)
nRBC: 1.7 % — ABNORMAL HIGH (ref 0.0–0.2)

## 2024-01-29 LAB — PHOSPHORUS: Phosphorus: 3.6 mg/dL (ref 2.5–4.6)

## 2024-01-29 LAB — MAGNESIUM: Magnesium: 1.7 mg/dL (ref 1.7–2.4)

## 2024-01-29 MED ORDER — MAGNESIUM SULFATE 2 GM/50ML IV SOLN
2.0000 g | Freq: Once | INTRAVENOUS | Status: AC
  Administered 2024-01-29: 2 g via INTRAVENOUS
  Filled 2024-01-29: qty 50

## 2024-01-29 MED ORDER — TRAVASOL 10 % IV SOLN
INTRAVENOUS | Status: AC
  Filled 2024-01-29: qty 826.8

## 2024-01-29 MED ORDER — INSULIN ASPART 100 UNIT/ML IJ SOLN
4.0000 [IU] | Freq: Once | INTRAMUSCULAR | Status: AC
  Administered 2024-01-29: 4 [IU] via SUBCUTANEOUS
  Filled 2024-01-29: qty 4

## 2024-01-29 MED ORDER — INSULIN GLARGINE-YFGN 100 UNIT/ML ~~LOC~~ SOLN
6.0000 [IU] | Freq: Every day | SUBCUTANEOUS | Status: DC
  Administered 2024-01-30 – 2024-02-01 (×3): 6 [IU] via SUBCUTANEOUS
  Filled 2024-01-29 (×4): qty 0.06

## 2024-01-29 MED ORDER — INSULIN ASPART 100 UNIT/ML IJ SOLN
6.0000 [IU] | Freq: Once | INTRAMUSCULAR | Status: AC
  Administered 2024-01-29: 6 [IU] via SUBCUTANEOUS

## 2024-01-29 MED ORDER — INSULIN ASPART 100 UNIT/ML IJ SOLN
0.0000 [IU] | INTRAMUSCULAR | Status: DC
  Administered 2024-01-29: 4 [IU] via SUBCUTANEOUS
  Administered 2024-01-30 (×3): 1 [IU] via SUBCUTANEOUS
  Administered 2024-01-30 (×2): 3 [IU] via SUBCUTANEOUS
  Administered 2024-01-31: 4 [IU] via SUBCUTANEOUS
  Administered 2024-01-31: 2 [IU] via SUBCUTANEOUS
  Administered 2024-02-01 (×4): 1 [IU] via SUBCUTANEOUS
  Administered 2024-02-01: 2 [IU] via SUBCUTANEOUS
  Administered 2024-02-01: 1 [IU] via SUBCUTANEOUS
  Administered 2024-02-02 (×2): 3 [IU] via SUBCUTANEOUS
  Administered 2024-02-02: 4 [IU] via SUBCUTANEOUS
  Administered 2024-02-02: 3 [IU] via SUBCUTANEOUS
  Administered 2024-02-02 – 2024-02-03 (×3): 2 [IU] via SUBCUTANEOUS
  Administered 2024-02-03 (×5): 1 [IU] via SUBCUTANEOUS
  Administered 2024-02-04 (×3): 2 [IU] via SUBCUTANEOUS
  Administered 2024-02-04: 4 [IU] via SUBCUTANEOUS
  Administered 2024-02-05: 2 [IU] via SUBCUTANEOUS
  Administered 2024-02-05: 1 [IU] via SUBCUTANEOUS
  Administered 2024-02-05: 3 [IU] via SUBCUTANEOUS
  Administered 2024-02-05: 4 [IU] via SUBCUTANEOUS
  Administered 2024-02-06: 3 [IU] via SUBCUTANEOUS
  Filled 2024-01-29: qty 2
  Filled 2024-01-29 (×2): qty 1
  Filled 2024-01-29: qty 3
  Filled 2024-01-29: qty 1
  Filled 2024-01-29: qty 4
  Filled 2024-01-29 (×3): qty 1
  Filled 2024-01-29: qty 3
  Filled 2024-01-29 (×2): qty 1
  Filled 2024-01-29 (×2): qty 3
  Filled 2024-01-29 (×2): qty 2
  Filled 2024-01-29 (×2): qty 4
  Filled 2024-01-29 (×2): qty 2
  Filled 2024-01-29: qty 3
  Filled 2024-01-29: qty 1
  Filled 2024-01-29: qty 2
  Filled 2024-01-29: qty 3
  Filled 2024-01-29: qty 2
  Filled 2024-01-29 (×3): qty 1
  Filled 2024-01-29: qty 3
  Filled 2024-01-29: qty 1
  Filled 2024-01-29: qty 4
  Filled 2024-01-29: qty 2
  Filled 2024-01-29: qty 1
  Filled 2024-01-29 (×3): qty 2
  Filled 2024-01-29: qty 4
  Filled 2024-01-29: qty 1

## 2024-01-29 MED ORDER — INSULIN GLARGINE-YFGN 100 UNIT/ML ~~LOC~~ SOLN
6.0000 [IU] | Freq: Once | SUBCUTANEOUS | Status: AC
  Administered 2024-01-29: 6 [IU] via SUBCUTANEOUS
  Filled 2024-01-29: qty 0.06

## 2024-01-29 MED ORDER — INSULIN ASPART 100 UNIT/ML IJ SOLN: 10.0000 [IU] | Freq: Once | INTRAMUSCULAR | Status: DC

## 2024-01-29 NOTE — Progress Notes (Addendum)
   Latest Reference Range & Units 01/29/24 11:47 01/29/24 17:08 01/29/24 18:00 01/29/24 19:39 01/29/24 23:37  Glucose-Capillary 70 - 99 mg/dL 577 (H) 591 (H) 618 (H) 330 (H) 208 (H)  (H): Data is abnormally high    01/29/24 2100  Provider Notification  Provider Name/Title Dr. Franky  Date Provider Notified 01/29/24  Time Provider Notified 2026  Method of Notification Page  Notification Reason 19:39, CBG 330 mg/dl. We supposed to give 7 units of Novolog  based on sensitive scale. Pt also has had insulin  8 unit in TPN rate 65 ml/hr.   Pt expressed her frustrations about insulin  management today. Emotional support was provided.   I personally had experience working night shift with her CBG dropped to 56 mg/dl after we followed the normal sensitive sliding scale before. 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   According to the diabetes coordinator, Patient is very sensitive to insulin  doses and 1 unit likely drops her 50-60 mg/dL, therefore recommend conservative Novolog  doses when blood sugar is high.   Test performed and critical result CBG 330 mg/dl  Date Critical Result Received 01/29/24  Time Critical Result Received 19:39  Provider response Evaluate remotely;See new orders Decrease the insulin  dose to very sensitive sliding scale.  Date of Provider Response 01/29/24  Time of Provider Response 2026   At 20:26.after Dr. Franky changed order to very sensitive sliding scale, Pt got 4 units of Novolog  ( instead of 7 with the old sensitive scale)   Pt agreed with the very sensitive sliding scale. Pt expressed her appreciations for being attentive caring for her.  23:45 Pt CBG 208 mg/dl. She supposed to get 2 units, but Pt refused the Novolog  insulin . She stated  I think my blood glucose drops too quickly after the last dose of insulin . We will continue to monitor CBG q 4 hrs.   Wendi Dash, RN

## 2024-01-29 NOTE — Progress Notes (Signed)
 Patient ID: Sherri Todd, female   DOB: [birth year], 63 y.o.   MRN: [id number] [facility] Progress Note   Assessment/ Plan:   1. Acute kidney Injury: Currently nonoliguric after recovery from initial oliguric acute kidney injury in the setting of shock/mild ATN.  With decent urine output overnight and stable renal function.  Potassium level on the upper side of normal but without indication for intervention.   2.  History of simultaneous kidney/pancreas transplantation in 1999: Previously on MMF/tacrolimus  and prednisone  with the former held at this time.  Transitioned to sublingual tacrolimus  while NPO and IV Solu-Medrol  instead of prednisone .  She is postoperative day #6 today and will defer beginning MMF until ileus is resolved.  Leukocytosis may be associated with her Solu-Medrol . 3.  Ischemic bowel: Postoperative day #6 with exploratory laparotomy and resection of distal ileum/ascending colon and explant of transplanted pancreas for the management of internal hernia/small bowel volvulus with necrosis of terminal ileum and ascending colon. 4.  Septic shock: Appears to have improved/resolved status post management of ischemic bowel with surgical intervention and currently on Zosyn .  Subjective:   She complains of nausea but wanting to eat something at this time.  Passed some flatus overnight.  Problem   Objective:   BP 135/73 (BP Location: Right Arm)   Pulse 87   Temp 98.2 F (36.8 C) (Oral)   Resp 18   Ht 5' 1 (1.549 m)   Wt 43.2 kg   SpO2 100%   BMI 18.00 kg/m   Intake/Output Summary (Last 24 hours) at 01/29/2024 0949 Last data filed at 01/29/2024 9388 Gross per 24 hour  Intake 630.05 ml  Output 1500 ml  Net -869.95 ml   Weight change:   Physical Exam: Gen: Slim woman, appears uncomfortable resting in bed with NG tube in place CVS: Pulse regular rhythm, normal rate, S1 and S2 normal Resp: Diminished breath sounds over bases, poor inspiratory effort, no  rales/rhonchi Abd: Intact abdominal midline dressing, bowel sounds scant Ext: No lower extremity edema  Imaging: No results found.  Labs: BMET Recent Labs  Lab 01/25/24 0458 01/26/24 0334 01/27/24 0545 01/27/24 1517 01/27/24 1917 01/28/24 0223 01/28/24 1755 01/29/24 0510  NA 142 140 143  --  141 142 139 136  K 3.7 2.7* 2.6* 4.1 3.5 3.4* 5.4* 5.0  CL 104 102 101  --  104 105 105 102  CO2 25 27 31   --  31 27 21* 24  GLUCOSE 126* 314* 295*  --  75 116* 261* 300*  BUN 43* 47* 28*  --  23 23 26* 25*  CREATININE 1.40* 1.25* 0.98  --  0.82 0.83 0.97 0.90  CALCIUM  8.3* 8.6* 8.5*  --  8.6* 8.4* 8.4* 8.3*  PHOS 3.7 3.3 2.1*  --  2.7 2.9 3.9 3.6   CBC Recent Labs  Lab 01/25/24 0458 01/26/24 0334 01/27/24 0545 01/29/24 0510  WBC 20.5* 11.8* 12.8* 17.4*  NEUTROABS  --   --   --  14.2*  HGB 8.5* 8.5* 9.0* 9.1*  HCT 24.8* 25.6* 26.6* 28.1*  MCV 102.1* 101.2* 99.6 105.6*  PLT 131* 133* 109* 170    Medications:     Chlorhexidine  Gluconate Cloth  6 each Topical Daily   heparin   5,000 Units Subcutaneous Q8H   insulin  aspart  0-9 Units Subcutaneous Q4H   insulin  glargine-yfgn  2 Units Subcutaneous BID   methylPREDNISolone  (SOLU-MEDROL ) injection  10 mg Intravenous Daily   pantoprazole  (PROTONIX ) IV  40 mg Intravenous  Daily   sodium chloride  flush  10-40 mL Intracatheter Q12H   tacrolimus   0.5 mg Sublingual BID    Gordy Blanch, MD 01/29/2024, 9:49 AM

## 2024-01-29 NOTE — Progress Notes (Signed)
 Physical Therapy Treatment Patient Details Name: Sherri Todd MRN: [id number] DOB: [birth year] Today's Date: 01/29/2024   History of Present Illness Pt is a 63 y.o female presented to ED 11/17 for R sided abdominal pain. CT showed small bowel obstruction. Intubated 11/17 for S/p ex lap, resection of distal ileum and ascending colon, explant of transplanted pancreas, temporary closure. 11/18 S/p reopening of prior laparotomy,  ileocolonic anastamosis at proximal transverse colon, suture repair upon closure of umbilical hernia. Extubated 11/19. PMH:  kidney-pancreas transplant 1999, HTN, CVA, DM    PT Comments  Pt is progressing well towards goals. Currently pt is CGA for bed mobility, Min A to CGA for sit to stand with RW and Min A for short distance gait with +2 for chair follow due to fatigue. Spouse present and supportive throughout session. Pt was previously very active and ind. Due to pt current functional status, home set up and available assistance at home recommending skilled physical therapy services > 3 hours/day in order to address strength, balance and functional mobility to decrease risk for falls, injury, immobility, skin break down and re-hospitalization.      If plan is discharge home, recommend the following: Assist for transportation;Assistance with cooking/housework;Help with stairs or ramp for entrance;A little help with walking and/or transfers     Equipment Recommendations  BSC/3in1;Rolling walker (2 wheels)       Precautions / Restrictions Precautions Precautions: Fall;Other (comment) Recall of Precautions/Restrictions: Impaired Precaution/Restrictions Comments: Abdominal for comfort, recal pouch, NGT, foley Restrictions Weight Bearing Restrictions Per Provider Order: No     Mobility  Bed Mobility Overal bed mobility: Needs Assistance Bed Mobility: Supine to Sit     Supine to sit: Contact guard, HOB elevated          Transfers Overall transfer level:  Needs assistance Equipment used: Rolling walker (2 wheels) Transfers: Sit to/from Stand, Bed to chair/wheelchair/BSC Sit to Stand: Min assist   Step pivot transfers: Contact guard assist       General transfer comment: Min A for initial momentum and stability to get to standing from EOB. CGA for second sit to stand from recliner with good hand placement. CGA for stepping from EOB to recliner.    Ambulation/Gait Ambulation/Gait assistance: Min assist, +2 safety/equipment Gait Distance (Feet): 15 Feet Assistive device: Rolling walker (2 wheels) Gait Pattern/deviations: Step-to pattern, Decreased stride length, Shuffle Gait velocity: decreased Gait velocity interpretation: <1.31 ft/sec, indicative of household ambulator   General Gait Details: pt taking very short shuffling steps. pt stopping intermittently every few steps, chair follow for safety    Balance Overall balance assessment: Needs assistance Sitting-balance support: Bilateral upper extremity supported, Feet supported Sitting balance-Leahy Scale: Fair     Standing balance support: No upper extremity supported, During functional activity Standing balance-Leahy Scale: Poor Standing balance comment: reliant on UE support        Communication Communication Communication: No apparent difficulties  Cognition Arousal: Alert Behavior During Therapy: Flat affect   PT - Cognitive impairments: No apparent impairments       Following commands: Intact      Cueing Cueing Techniques: Verbal cues     General Comments General comments (skin integrity, edema, etc.): spouse present and supportive throughout session      Pertinent Vitals/Pain Pain Assessment Pain Assessment: Faces Faces Pain Scale: Hurts little more Facial Expression: Grimacing Body Movements: Protection Muscle Tension: Tense, rigid Compliance with ventilator (intubated pts.): N/A Vocalization (extubated pts.): Talking in normal tone or no sound CPOT  Total: 4 Pain Location: Abdomen Pain Descriptors / Indicators: Discomfort, Grimacing Pain Intervention(s): Monitored during session, Limited activity within patient's tolerance, Repositioned     PT Goals (current goals can now be found in the care plan section) Acute Rehab PT Goals Patient Stated Goal: to improve mobility and decrease pain PT Goal Formulation: With patient Time For Goal Achievement: 02/08/24 Potential to Achieve Goals: Good Progress towards PT goals: Progressing toward goals    Frequency    Min 3X/week      PT Plan  Continue with current POC        AM-PAC PT 6 Clicks Mobility   Outcome Measure  Help needed turning from your back to your side while in a flat bed without using bedrails?: A Little Help needed moving from lying on your back to sitting on the side of a flat bed without using bedrails?: A Little Help needed moving to and from a bed to a chair (including a wheelchair)?: A Little Help needed standing up from a chair using your arms (e.g., wheelchair or bedside chair)?: A Little Help needed to walk in hospital room?: Total Help needed climbing 3-5 steps with a railing? : Total 6 Click Score: 14    End of Session Equipment Utilized During Treatment: Oxygen;Gait belt (gait belt at axilla) Activity Tolerance: Patient tolerated treatment well Patient left: in chair;with call bell/phone within reach;with family/visitor present;with chair alarm set Nurse Communication: Mobility status PT Visit Diagnosis: Unsteadiness on feet (R26.81);Other abnormalities of gait and mobility (R26.89);Muscle weakness (generalized) (M62.81);Pain Pain - part of body:  (abdomen)     Time: 8571-8540 PT Time Calculation (min) (ACUTE ONLY): 31 min  Charges:    $Therapeutic Activity: 23-37 mins PT General Charges $$ ACUTE PT VISIT: 1 Visit                     Dorothyann Maier, DPT, CLT  Acute Rehabilitation Services Office: [phone] (Secure chat  preferred)    Dorothyann VEAR Maier 01/29/2024, 4:03 PM

## 2024-01-29 NOTE — Progress Notes (Signed)
 PHARMACY - TOTAL PARENTERAL NUTRITION CONSULT NOTE   Indication: Prolonged ileus  Patient Measurements: Height: 5' 1 (154.9 cm) Weight: 43.2 kg (95 lb 3.8 oz) IBW/kg (Calculated) : 47.8 TPN AdjBW (KG): 51.6 Body mass index is 18 kg/m. Usual Weight: 49 kg  Assessment: 63 years of  age female with history of kidney and pancreas transplant 12/1997 followed at [facility] admitted for abdominal pain, nausea, and hypotension found to have internal hernia with small bowel volvulus and necrosis of terminal ileum and ascending colon requiring emergent laparotomy for resection of distal ileum and ascending colon, explant of transplanted pancreas, and ABThera VAC placement on 11/17 and returned for abdominal washout, ileocolic anastomosis, and abdominal closure 11/18. Patient was extubated 11/19. Patient is having bowel function but also nausea and vomiting with likely ileus. NGT placed to low-intermittent suction and patient made NPO. Pharmacy consulted for TPN.   Patient has a noted intolerance to soy as nausea - we will use SMOFlipids, which of our two lipid choices, has the least amount of soybean oil and monitor tolerance.   Glucose / Insulin : CBG 206-306. MD reduced Semglee  to 2 BID (so received total 8 units Semglee  yesterday). Used 28 units of SSI in last 24 hours. Solumedrol 10mg  daily. Monitoring patient response closely in setting of removed transplanted pancreas. DM Coordinator added CGM monitor on 11/24.  Electrolytes: Na 136, K 5 (goal >=4), Phos 3.6, Mg 1.7, CoCa 9.8 Renal: AKI resolved, SCr  0.9, BUN 25.  Hepatic: LFTs/Tbili/TG within normal limits. Albumin  2.3.  Intake / Output; MIVF: UOP 1350 mL (1.3 mL/kg/hr). NGT output . ABThera VAC removed. LBM 11/23.  Stool volume not charted last 24 hrs.  GI Imaging: 11/17 KUB - dilated small bowel loops consistent with obstruction 11/17 CT Abd - distal small bowel obstruction d/t mesenteric volvulus, no free gas, trace ascites 11/21 KUB -  pending GI Surgeries / Procedures:  11/17 Exlap resection of distal ileum and ascending colon, explant of transplanted pancreas, and ABThera VAC placement  11/18 abdominal washout, ileocolic anastomosis, and abdominal closure  Central access: PICC pending  TPN start date: 01/26/24  Nutritional Goals: Goal TPN rate is 65 mL/hr (provides 83 g of protein and 1515 kcals per day)  RD Assessment: Estimated Needs Total Energy Estimated Needs: 1500-1700 kcals Total Protein Estimated Needs: 75-90 g Total Fluid Estimated Needs: 1.5L  Current Nutrition:  NPO and TPN *Will need pancreatic enzymes once taking enteral diet  Plan:  Increase TPN to goal 65 mL/hr at 1800- providing ~100% of needs.  *This TPN provides ~210.6g dextrose .  Electrolytes in TPN: Na 76mEq/L, reduce K 50 mEq/L, Ca 53mEq/L, Mg 89mEq/L, and Phos 20mmol/L. Cl:Ac 1:1 Add standard MVI and trace elements to TPN Add Thiamine 100 mg to TPN - through 02/02/24. Continue very Sensitive q4h SSI and basal insulin  with Semglee  6 units daily per MD and DM Coordinator.  Add 8 units regular insulin  to TPN - DM Coordinator and MD aware. Give Magnesium  2g IV x1 today Monitor TPN labs on Mon/Thurs, daily electrolytes for next 3 days.    Harlene Boga, PharmD, BCPS, BCCCP Please refer to [facility] for [facility] Pharmacy numbers 01/29/2024,7:42 AM

## 2024-01-29 NOTE — Inpatient Diabetes Management (Addendum)
 Inpatient Diabetes Program Recommendations  AACE/ADA: New Consensus Statement on Inpatient Glycemic Control (2015)  Target Ranges:  Prepandial:   less than 140 mg/dL      Peak postprandial:   less than 180 mg/dL (1-2 hours)      Critically ill patients:  140 - 180 mg/dL   Lab Results  Component Value Date   GLUCAP 422 (H) 01/29/2024   HGBA1C 5.0 01/22/2024    Review of Glycemic Control  Latest Reference Range & Units 01/28/24 11:04 01/28/24 16:13 01/28/24 20:26 01/28/24 23:39 01/29/24 04:58 01/29/24 11:47  Glucose-Capillary 70 - 99 mg/dL 729 (H) 793 (H) 747 (H) 221 (H) 306 (H) 422 (H)   Diabetes history: Patient was diagnosed with Type 1 DM at age 56.  She received a kidney/pancreas transplant in 1999.  Transplanted pancreas removed during surgery this admit Outpatient Diabetes medications:  None Current orders for Inpatient glycemic control:  TPN-  Novolog  0-9 units q 4 hours Semglee  6 units daily  Inpatient Diabetes Program Recommendations:    Collaborated with MD and pharmacy regarding insulin  and Type 1 DM (since removal of pancreas transplant).  Agree with continued basal insulin  dose as patient will need basal since she does not produce insulin .  Also agree with low dose insulin  in TPN (8 units) to cover dextrose /CHO.   Patient is very sensitive to insulin  doses and 1 unit likely drops her 50-60 mg/dL, therefore recommend conservative Novolog  doses when blood sugar is high.   I spoke to patient at bedside regarding plan.  She is a scientific laboratory technician and DM educator, therefore is very knowledgeable of insulins and diabetes.  She states that she has struggled with hypoglycemia in the past (when she had the transplanted pancreas).  Order received to place CGM and patient was very appreciative.  This will allow her to also be able to see her blood sugar values and alert RN if she is dropping.  Will follow closely.   MD ordered application of Freestyle CGM for patient. Education done  regarding application and changing CGM sensor (alternate every 15 days on back of arms), 1 hour warm-up, use of glucometer when alert displays, how to scan CGM for glucose reading and information for PCP.    Sensor applied to (L) Arm at (1245).  Explained that glucose readings will not be available until 1 hour after application. Reviewed use of CGM including how to scan, changing Sensor, Vitamin C warning, arrows with glucose readings, and Freestyle app.    -Note CBG up to 422 mg/dL.  Agree with MD's order for 6 units of Novolog .  Do not recommend giving rapid acting insulin  more often than every 4 hours to prevent insulin  stacking.  Also recommend adding a one time dose of Semglee  4 units today so that basal insulin  equals 6 units.    Thanks,  Randall Bullocks, RN, BC-ADM Inpatient Diabetes Coordinator Pager [phone]  (8a-5p)

## 2024-01-29 NOTE — Progress Notes (Signed)
 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 multifactorial--hopefully only 2/2 Steroids Severe hypokalemia probably from losses with diarrhea? TPN replacement as well as pharmacy to replace magnesium  okay today Diabetes mellitus type 1 status post transplant on CellCept  tacrolimus  and prednisone  Brittle diabetes with hypoglycemia Solu-Cortef  --> Solu-Medrol  10 daily, continues tacrolimus  sublingually 0.5 twice daily Brittle--highs 400, lows also---TPN with 8 U---Semglee  6 daily with Sensitive SSI q 4 CGM per DM coord Anemia of expected blood loss from surgery-underlying macrocytosis and mild thrombocytopenia Watch labs periodically---hemoglobin is stable---PLT have rebounded Stroke complicating transplant as above-fully recovered at the time No persisting current focal deficits Moderate controlled hypertension As needed hydralazine  ordered 5 mg IV for blood pressure above 160-intermittently well-controlled AKI on admission secondary to sepsis Resolving well--fluids as above-phosphorus and  micronutrient replacement as per pharmacist Depression Npo for now holding home meds Prozac  at this time  Data Reviewed today:   BMET    Component Value Date/Time   NA 136 01/29/2024 0510   K 5.0 01/29/2024 0510   CL 102 01/29/2024 0510   CO2 24 01/29/2024 0510   GLUCOSE 300 (H) 01/29/2024 0510   BUN 25 (H) 01/29/2024 0510   CREATININE 0.90 01/29/2024 0510   CALCIUM  8.3 (L) 01/29/2024 0510   GFRNONAA >60 01/29/2024 0510   CBC    Component Value Date/Time   WBC 17.4 (H) 01/29/2024 0510   RBC 2.66 (L) 01/29/2024 0510   HGB 9.1 (L) 01/29/2024 0510   HCT 28.1 (L) 01/29/2024 0510   PLT 170 01/29/2024 0510   MCV 105.6 (H) 01/29/2024 0510   MCH 34.2 (H) 01/29/2024 0510   MCHC 32.4 01/29/2024 0510   RDW 17.2 (H) 01/29/2024 0510   LYMPHSABS 1.2 01/29/2024 0510   MONOABS 0.9 01/29/2024 0510   EOSABS 0.2 01/29/2024 0510   BASOSABS 0.0 01/29/2024 0510     DVT prophylaxis: Heparin   Status is: Inpatient Inpatient pending resolution    Dispo/Global plan: Unclear at this time has an ileus defer to general surgery   Time 20   Subjective:   No N Smiling a bit today No CP Feels wamr to touch ++ flatu, no diarr  Objective + exam Vitals:   01/29/24 0314 01/29/24 0751 01/29/24 1154 01/29/24 1547  BP: (!) 147/70 135/73 (!) 141/66   Pulse:  87 82   Resp: 16 18 18    Temp:  98.2 F (36.8 C) 99.4 F (37.4 C) 98.6 F (37 C)  TempSrc:  Oral Oral Axillary  SpO2: 100% 100% 95%   Weight:      Height:       Filed Weights   01/26/24  0422 01/27/24 0409 01/28/24 0420  Weight: 37.3 kg 33.2 kg 43.2 kg     Examination:   coherent awake alert no distress EOMI NCAT Chest is clear Abdomen distended --didn't look under bandage No lower extremity edema Euthymic although chronically ill-appearing  Scheduled Meds:  Chlorhexidine  Gluconate Cloth  6 each Topical Daily   heparin   5,000 Units Subcutaneous Q8H   insulin  aspart  0-9 Units Subcutaneous Q4H   [START ON 01/30/2024]  insulin  glargine-yfgn  6 Units Subcutaneous Daily   methylPREDNISolone  (SOLU-MEDROL ) injection  10 mg Intravenous Daily   pantoprazole  (PROTONIX ) IV  40 mg Intravenous Daily   sodium chloride  flush  10-40 mL Intracatheter Q12H   tacrolimus   0.5 mg Sublingual BID   Continuous Infusions:  sodium chloride      sodium chloride  Stopped (01/23/24 1225)   sodium chloride  Stopped (01/23/24 1224)   piperacillin -tazobactam (ZOSYN )  IV 3.375 g (01/29/24 1345)   TPN ADULT (ION) 45 mL/hr at 01/28/24 1831   TPN ADULT (ION)     sodium chloride , sodium chloride , sodium chloride , albuterol , dextrose , hydrALAZINE , HYDROmorphone  (DILAUDID ) injection, labetalol , ondansetron  (ZOFRAN ) IV, mouth rinse, mouth rinse, prochlorperazine , sodium chloride  flush  Jai-Gurmukh Christiaan Strebeck, MD  [facility]

## 2024-01-29 NOTE — Progress Notes (Signed)
 Progress Note  6 Days Post-Op  Subjective: Afebrile, vitals stable. Reports significant nausea with NG clamped. Having bowel function. WBC 17.  Objective: Vital signs in last 24 hours: Temp:  [98.2 F (36.8 C)-98.6 F (37 C)] 98.2 F (36.8 C) (11/24 0751) Pulse Rate:  [73-87] 87 (11/24 0751) Resp:  [16-20] 18 (11/24 0751) BP: (117-147)/(61-77) 135/73 (11/24 0751) SpO2:  [93 %-100 %] 100 % (11/24 0751) Last BM Date : 01/28/24  Intake/Output from previous day: 11/23 0701 - 11/24 0700 In: 630.1 [I.V.:456.7; IV Piggyback:173.3] Out: 1500 [Urine:1350; Emesis/NG output:150] Intake/Output this shift: No intake/output data recorded.  PE: General: resting in bed, NAD Heart: RRR Lungs: nonlabored respirations HEENT: NG tube with thin bilious output Abd: minimally distended but soft. Midline wound is open at the skin, wound bed is clean and dry, fascia in tact. GU: foley present with straw colored urine     Lab Results:  Recent Labs    01/27/24 0545 01/29/24 0510  WBC 12.8* 17.4*  HGB 9.0* 9.1*  HCT 26.6* 28.1*  PLT 109* 170   BMET Recent Labs    01/28/24 1755 01/29/24 0510  NA 139 136  K 5.4* 5.0  CL 105 102  CO2 21* 24  GLUCOSE 261* 300*  BUN 26* 25*  CREATININE 0.97 0.90  CALCIUM  8.4* 8.3*   PT/INR No results for input(s): LABPROT, INR in the last 72 hours.  CMP     Component Value Date/Time   NA 136 01/29/2024 0510   K 5.0 01/29/2024 0510   CL 102 01/29/2024 0510   CO2 24 01/29/2024 0510   GLUCOSE 300 (H) 01/29/2024 0510   BUN 25 (H) 01/29/2024 0510   CREATININE 0.90 01/29/2024 0510   CALCIUM  8.3 (L) 01/29/2024 0510   PROT 4.9 (L) 01/29/2024 0510   ALBUMIN  2.3 (L) 01/29/2024 0510   AST 27 01/29/2024 0510   ALT 32 01/29/2024 0510   ALKPHOS 53 01/29/2024 0510   BILITOT 0.7 01/29/2024 0510   GFRNONAA >60 01/29/2024 0510   Lipase  No results found for: LIPASE     Studies/Results: DG CHEST PORT 1 VIEW Result Date:  01/22/2024 EXAM: 1 VIEW(S) XRAY OF THE CHEST 01/22/2024 12:25:00 AM COMPARISON: None available. CLINICAL HISTORY: History of ETT [id number]; Encounter for orogastric (OG) tube placement [id number] FINDINGS: LINES, TUBES AND DEVICES: Endotracheal tube with tip extended 2 cm down the right main bronchus and needs to be withdrawn 5 cm to the mid trachea. Nasogastric tube has been inserted and terminates in the distal body of the stomach. LUNGS AND PLEURA: Vascular markings are normal. No focal pneumonia is seen. No pleural effusion. No pneumothorax. HEART AND MEDIASTINUM: There is mild chronic cardiomegaly. The mediastinum is normally outlined. BONES AND SOFT TISSUES: A bone island is again noted in the anterior right 3rd rib. Slight thoracic dextroscoliosis. IMPRESSION: 1. Endotracheal tube malposition with tip 2 cm into the right main bronchus; recommend withdrawal by 5 cm to the mid trachea. 2. Mild chronic cardiomegaly. 3. No evidence of chf or other acute chest process. 4. These results will be telephoned to the referring provider or the referring providers representative by professional radiology assistant [facility]) personnel, with communication documented in the [facility] dashboard. Electronically signed by: Francis Quam MD 01/22/2024 07:20 AM EST RP Workstation: [id number]   DG Abd Portable 1 View Result Date: 01/22/2024 EXAM: 1 VIEW XRAY OF THE ABDOMEN 01/22/2024 01:49:00 AM COMPARISON: None available. CLINICAL HISTORY: NG tube placement. FINDINGS: LINES, TUBES AND DEVICES: Nasogastric tube tip  overlies the distal body of the stomach. BOWEL: Dilated loops of small bowel were seen throughout the visualized abdomen in keeping with a distal small bowel obstruction. No free air. SOFT TISSUES: No opaque urinary calculi. BONES: No acute osseous abnormality. IMPRESSION: 1. Dilated loops of small bowel consistent with distal small bowel obstruction. No free air. 2. Nasogastric tube tip overlies the distal body of the stomach.  Electronically signed by: Dorethia Molt MD 01/22/2024 02:00 AM EST RP Workstation: [id number]   CT ABDOMEN PELVIS WO CONTRAST Addendum Date: 01/22/2024 ADDENDUM #1 ADDENDUM: The critical findings of this study were discussed directly with Dr. Bari by myself at 1:37 am Electronically signed by: Dorethia Molt MD 01/22/2024 01:37 AM EST RP Workstation: [id number]   Result Date: 01/22/2024  ORIGINAL REPORT  EXAM: CT ABDOMEN AND PELVIS WITHOUT CONTRAST 01/22/2024 01:00:39 AM TECHNIQUE: CT of the abdomen and pelvis was performed without the administration of intravenous contrast. Multiplanar reformatted images are provided for review. Automated exposure control, iterative reconstruction, and/or weight-based adjustment of the mA/kV was utilized to reduce the radiation dose to as low as reasonably achievable. COMPARISON: None available. CLINICAL HISTORY: Abdominal pain, acute, nonlocalized; Sepsis. FINDINGS: LOWER CHEST: Extensive coronary artery calcifications. The distal esophagus is fluid filled with changes of esophageal dysmotility or gastroesophageal reflux. Superimposed circumferential wall thickening may reflect changes of reflux esophagitis. LIVER: The liver is unremarkable. GALLBLADDER AND BILE DUCTS: Gallbladder is unremarkable. No biliary ductal dilatation. SPLEEN: No acute abnormality. PANCREAS: No acute abnormality. ADRENAL GLANDS: No acute abnormality. KIDNEYS, URETERS AND BLADDER: Native kidneys are markedly atrophic in keeping with given history of end-stage renal disease. Transplant kidney noted within the left iliac fossa with small perinephric fluid collection present also representing a postoperative seroma, lymphocele, or chronic hematoma. No significant mass effect on the transplant kidney. No stones in the kidneys or ureters. No hydronephrosis. No perinephric or periureteral stranding. Urinary bladder is unremarkable. GI AND BOWEL: There is a distal small bowel obstruction present with  numerous loops of more proximal dilated, fluid-filled loops of small bowel as well as fluid distention of the stomach and duodenum. There is a whirl appearance of the mesenteric vasculature in keeping with a mesenteric volvulus best appreciated on image 51 - 56, series 3. The point of obstruction occurs just distal to an anastomotic line noted within the mid pelvis. The terminal ileum and colon are decompressed. PERITONEUM AND RETROPERITONEUM: Trace ascites. No free intraperitoneal gas. VASCULATURE: Aorta is normal in caliber. LYMPH NODES: No lymphadenopathy. REPRODUCTIVE ORGANS: Uterus is unremarkable. No adnexal masses. BONES AND SOFT TISSUES: Osseous structures are age appropriate. No acute bone abnormality. No lytic or blastic bone lesion. Small fat-containing umbilical hernia. IMPRESSION: 1. 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 No free intraperitoneal gas. Trace ascites. 2. Changes of esophageal dysmotility or gastroesophageal reflux with superimposed circumferential distal esophageal wall thickening, possibly reflecting reflux esophagitis. 3. Transplant kidney in the left iliac fossa with small perinephric fluid collection, possibly representing a postoperative seroma, lymphocele, or chronic hematoma. No significant mass effect on the transplant kidney. 4. Reported pancreatic transplant is not well appreciated on this examination due to noncontrast technique and superimposed pathology. Electronically signed by: Dorethia Molt MD 01/22/2024 01:31 AM EST RP Workstation: [id number]   DG Chest Port 1 View if patient is in a treatment room. Result Date: 01/22/2024 EXAM: 1 VIEW(S) XRAY OF THE CHEST 01/22/2024 12:46:00 AM COMPARISON: Chest x-ray 05/16/2022, CT right shoulder 01/17/2015. CLINICAL HISTORY: Suspected Sepsis. FINDINGS: LUNGS AND PLEURA:  Stable chronic 6 mm peripheral right upper lobe nodule. No focal pulmonary opacity. No pleural effusion. No pneumothorax.  HEART AND MEDIASTINUM: No acute abnormality of the cardiac and mediastinal silhouettes. BONES AND SOFT TISSUES: No acute osseous abnormality. IMPRESSION: 1. No acute cardiopulmonary process. Electronically signed by: Morgane Naveau MD 01/22/2024 12:55 AM EST RP Workstation: [id number]    Anti-infectives: Anti-infectives (From admission, onward)    Start     Dose/Rate Route Frequency Ordered Stop   01/24/24 1400  piperacillin -tazobactam (ZOSYN ) IVPB 3.375 g        3.375 g 12.5 mL/hr over 240 Minutes Intravenous Every 8 hours 01/24/24 0957     01/22/24 1200  piperacillin -tazobactam (ZOSYN ) IVPB 2.25 g  Status:  Discontinued        2.25 g 100 mL/hr over 30 Minutes Intravenous Every 6 hours 01/22/24 9386 01/24/24 0957   01/21/24 2330  ceFEPIme  (MAXIPIME ) 2 g in sodium chloride  0.9 % 100 mL IVPB        2 g 200 mL/hr over 30 Minutes Intravenous  Once 01/21/24 2316 01/21/24 2356   01/21/24 2330  metroNIDAZOLE  (FLAGYL ) IVPB 500 mg        500 mg 100 mL/hr over 60 Minutes Intravenous  Once 01/21/24 2316 01/22/24 0109        Assessment/Plan  Hx of kidney-pancreas transplant 1999, followed at [facility]  Internal hernia with small bowel volvulus and necrosis of terminal ileum and ascending colon POD7 ex-lap, resection of distal ileum and ascending colon, explant of transplanted pancreas, ABThera VAC placement Dr. Dasie  POD6 ex-lap, abdominal washout, ileocolic anastomosis, abdominal closure Dr. Aron - Having bowel function but also has persistent nausea with bilious NG output. NG placed back to low intermittent suction, leave on suction for today. - Continue TPN - Continue abx for now given elevated WBC and immunosuppressed state. - BID WTD to midline wound - Patient likely has resolving ileus. Keep NG tube today. If WBC continues to rise, plan for CT abd/pelvis in next 1-2 (patient will be 7 days postop at that point). Would prefer to give IV contrast if ok with nephrology. - T1DM s/p explant of  transplanted pancreas: blood sugars have been labile, on low dose Semglee  and sliding scale.    LOS: 7 days    Leonor LITTIE Dasie, MD  [facility] Surgery 01/29/2024, 10:02 AM Please see Amion for pager number during day hours 7:00am-4:30pm

## 2024-01-29 NOTE — Progress Notes (Signed)
 Nutrition Follow-up  DOCUMENTATION CODES:  Non-severe (moderate) malnutrition in context of chronic illness  INTERVENTION:  Continue TPN Management per pharmacy   Diet advancement per Surgery Once patient is on at least a full liquid diet or greater or if she begins on tube feeding, she will require pancreatic enzyme replacement therapy via Creon or RELiZORB depending on the source of nutrition  NUTRITION DIAGNOSIS:  Moderate Malnutrition related to chronic illness (ESRD s/p pancreatic and kidney transplants) as evidenced by moderate fat depletion, severe muscle depletion. 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  PMH includes type 1 DM and ESRD s/p pancreas and kidney transplants in 1999, HTN, CVA. Pt is followed by [facility]  11/17 CT: SBO secondary to mesenteric volvulus; OR: Ex Lap, resection of distal ileum and ascending colon, explant of transplanted pancreas, open abdomen with wound VAC  11/18: OR- re-opening of prior laparotomy, ileocolonic anastomosis as proximal transverse colon, suture repair  upon closure of umbilical hernia 11/19 extubated; diet advanced to clear liquids 11/21 emesis; NGT back to suction; DG abdomen- decreased gaseous distension of small bowel loops; NPO; TPN initiation 11/24 NGT back to suction  Per Surgery, pt with clamping trials yesterday. Today d/t persistent nausea and bilious output in NGT, NGT placed back to low intermittent suction today; no documented output in I/O's today. Continues on TPN.   Checked in with  patient at bedside. She continues to feel unwell.  She reports starving.  Pt reports having gluten intolerance as she feels very unwell after eating gluten. Overall she reports eating well up until about a week PTA and then she began experiencing frequent episodes of emesis being unable to keep food down.   She does not feel that she will do well with dairy containing supplements. Discussed that once she is able to advance to at least a full liquid diet we can order [employer] supplements for her which she is familiar with these supplements and amenable to this plan.   She thinks she is passing gas but is uncertain. Does not recall when last BM was.   Of note, pt is a neurosurgeon. She currently denies any nutrition related concerns or questions. Will continue to follow up as diet advances to adjust nutrition recommendations and plan of care.   TPN regimen: Advancing to goal rate of 60ml/hr (provides 83g protein and  1587 kcal per day)  DM coordinator updated recommendations: Plan to provide CGM for best blood sugar management.  Continue basal insulin  dose Low dose insulin  in the TPN  Admit weight: 47.6 kg Current weight: 43.2 kg  Medications: SSI 0-9 units q4h, semglee  6 units daily, solu-medrol , IV abx  Labs:  BUN 25 CBG's 206-306 x24 hours  Diet Order:   Diet Order             Diet NPO time specified Except for: Ice Chips  Diet effective now                   EDUCATION NEEDS:  Not appropriate for education at this time  Skin:  Skin Assessment: Skin  Integrity Issues: Skin Integrity Issues:: Wound VAC Wound Vac: abdomen removed  Last BM:  11/23 type 7 x 2  Height:  Ht Readings from Last 1 Encounters:  01/23/24 5' 1 (1.549 m)    Weight:  Wt Readings from Last 1 Encounters:  01/28/24 43.2 kg    BMI:  Body mass index is 18 kg/m.  Estimated Nutritional Needs:   Kcal:  1500-1700 kcals  Protein:  75-90 g  Fluid:  1.5L  Allie Elyssa Pendelton, RDN,  LDN Clinical Nutrition See AMiON for contact information.

## 2024-01-30 DIAGNOSIS — K559 Vascular disorder of intestine, unspecified: Secondary | ICD-10-CM | POA: Diagnosis not present

## 2024-01-30 LAB — CBC WITH DIFFERENTIAL/PLATELET
Abs Immature Granulocytes: 0.51 K/uL — ABNORMAL HIGH (ref 0.00–0.07)
Basophils Absolute: 0.1 K/uL (ref 0.0–0.1)
Basophils Relative: 0 %
Eosinophils Absolute: 0.1 K/uL (ref 0.0–0.5)
Eosinophils Relative: 1 %
HCT: 29.7 % — ABNORMAL LOW (ref 36.0–46.0)
Hemoglobin: 9.6 g/dL — ABNORMAL LOW (ref 12.0–15.0)
Immature Granulocytes: 3 %
Lymphocytes Relative: 6 %
Lymphs Abs: 1.1 K/uL (ref 0.7–4.0)
MCH: 34.5 pg — ABNORMAL HIGH (ref 26.0–34.0)
MCHC: 32.3 g/dL (ref 30.0–36.0)
MCV: 106.8 fL — ABNORMAL HIGH (ref 80.0–100.0)
Monocytes Absolute: 1.1 K/uL — ABNORMAL HIGH (ref 0.1–1.0)
Monocytes Relative: 6 %
Neutro Abs: 14.1 K/uL — ABNORMAL HIGH (ref 1.7–7.7)
Neutrophils Relative %: 84 %
Platelets: 208 K/uL (ref 150–400)
RBC: 2.78 MIL/uL — ABNORMAL LOW (ref 3.87–5.11)
RDW: 17.4 % — ABNORMAL HIGH (ref 11.5–15.5)
WBC: 16.9 K/uL — ABNORMAL HIGH (ref 4.0–10.5)
nRBC: 0.2 % (ref 0.0–0.2)

## 2024-01-30 LAB — GLUCOSE, CAPILLARY
Glucose-Capillary: 283 mg/dL — ABNORMAL HIGH (ref 70–99)
Glucose-Capillary: 252 mg/dL — ABNORMAL HIGH (ref 70–99)
Glucose-Capillary: 193 mg/dL — ABNORMAL HIGH (ref 70–99)
Glucose-Capillary: 180 mg/dL — ABNORMAL HIGH (ref 70–99)
Glucose-Capillary: 161 mg/dL — ABNORMAL HIGH (ref 70–99)

## 2024-01-30 LAB — PHOSPHORUS: Phosphorus: 3.8 mg/dL (ref 2.5–4.6)

## 2024-01-30 LAB — BASIC METABOLIC PANEL WITH GFR
Anion gap: 10 (ref 5–15)
BUN: 28 mg/dL — ABNORMAL HIGH (ref 8–23)
CO2: 21 mmol/L — ABNORMAL LOW (ref 22–32)
Calcium: 8.7 mg/dL — ABNORMAL LOW (ref 8.9–10.3)
Chloride: 103 mmol/L (ref 98–111)
Creatinine, Ser: 0.91 mg/dL (ref 0.44–1.00)
GFR, Estimated: >60 mL/min (ref >60)
Glucose, Bld: 341 mg/dL — ABNORMAL HIGH (ref 70–99)
Potassium: 5.1 mmol/L (ref 3.5–5.1)
Sodium: 134 mmol/L — ABNORMAL LOW (ref 135–145)

## 2024-01-30 LAB — MAGNESIUM: Magnesium: 2.1 mg/dL (ref 1.7–2.4)

## 2024-01-30 MED ORDER — FLUOXETINE HCL 20 MG PO CAPS
20.0000 mg | ORAL_CAPSULE | Freq: Every day | ORAL | Status: DC
  Administered 2024-01-30 – 2024-02-06 (×8): 20 mg via ORAL
  Filled 2024-01-30 (×8): qty 1

## 2024-01-30 MED ORDER — DIPHENHYDRAMINE HCL 50 MG/ML IJ SOLN
12.5000 mg | Freq: Four times a day (QID) | INTRAMUSCULAR | Status: DC | PRN
Start: 2024-01-30 — End: 2024-02-03
  Administered 2024-01-30 – 2024-02-02 (×2): 12.5 mg via INTRAVENOUS
  Filled 2024-01-30 (×2): qty 1

## 2024-01-30 MED ORDER — TRAVASOL 10 % IV SOLN
INTRAVENOUS | Status: AC
  Filled 2024-01-30: qty 904.8

## 2024-01-30 MED ORDER — ACETAMINOPHEN 325 MG PO TABS: 650.0000 mg | ORAL_TABLET | Freq: Four times a day (QID) | ORAL | Status: DC | PRN

## 2024-01-30 MED ORDER — ZOLPIDEM TARTRATE 5 MG PO TABS
5.0000 mg | ORAL_TABLET | Freq: Every day | ORAL | Status: DC
  Administered 2024-01-30 – 2024-01-31 (×2): 5 mg via ORAL
  Filled 2024-01-30 (×2): qty 1

## 2024-01-30 MED ORDER — HYDROMORPHONE HCL 1 MG/ML IJ SOLN: 0.5000 mg | INTRAMUSCULAR | Status: DC | PRN

## 2024-01-30 NOTE — Progress Notes (Addendum)
 Patient ID: Sherri Todd, female   DOB: 05-27-1960, 63 y.o.   MRN: [id number] [facility] Progress Note   Assessment/ Plan:   1. Acute kidney Injury: Currently nonoliguric after recovery from initial oliguric acute kidney injury in the setting of shock/mild ATN.  Fair urine output overnight with stable renal function.  Potassium dose adjusted in TPN.   2.  History of simultaneous kidney/pancreas transplantation in 1999: Previously on MMF/tacrolimus  and prednisone  with the former held at this time.  Transitioned to sublingual tacrolimus  while NPO and IV Solu-Medrol  instead of prednisone ; will switch this to her usual dose/frequency tomorrow if able to tolerate oral intake today without problems.  With leukocytosis likely associated with Solu-Medrol /recent surgery and plans noted to discontinue antibiotic today.  Plan to restart MMF tomorrow after it is established that she can tolerate oral intake/medications. 3.  Ischemic bowel: Postoperative day #7 after exploratory laparotomy and resection of distal ileum/ascending colon and explant of transplanted pancreas for the management of internal hernia/small bowel volvulus with necrosis of terminal ileum and ascending colon.  Plans noted to remove NG tube today and advance to clear liquid diet while continuing TPN for overlap. 4.  Septic shock: Appears to have improved/resolved status post management of ischemic bowel with surgical intervention.  Previously on Zosyn  that has been discontinued today after completion of 7 days of antibiotics.  Subjective:   Continue to pass flatus overnight without nausea/vomiting.  Objective:   BP (!) 140/66 (BP Location: Left Arm)   Pulse 84   Temp 97.6 F (36.4 C) (Oral)   Resp (!) 22   Ht 5' 1 (1.549 m)   Wt 45.3 kg   SpO2 98%   BMI 18.87 kg/m   Intake/Output Summary (Last 24 hours) at 01/30/2024 0847 Last data filed at 01/30/2024 0300 Gross per 24 hour  Intake 1426.67 ml  Output 750 ml   Net 676.67 ml   Weight change:   Physical Exam: Gen: Slim woman, appears uncomfortable resting in bed with NG tube in place CVS: Pulse regular rhythm, normal rate, S1 and S2 normal Resp: Diminished breath sounds over bases, poor inspiratory effort, no rales/rhonchi Abd: Intact abdominal midline dressing, bowel sounds scant Ext: No lower extremity edema  Imaging: No results found.  Labs: BMET Recent Labs  Lab 01/26/24 0334 01/27/24 0545 01/27/24 1517 01/27/24 1917 01/28/24 0223 01/28/24 1755 01/29/24 0510 01/30/24 0330  NA 140 143  --  141 142 139 136 134*  K 2.7* 2.6* 4.1 3.5 3.4* 5.4* 5.0 5.1  CL 102 101  --  104 105 105 102 103  CO2 27 31  --  31 27 21* 24 21*  GLUCOSE 314* 295*  --  75 116* 261* 300* 341*  BUN 47* 28*  --  23 23 26* 25* 28*  CREATININE 1.25* 0.98  --  0.82 0.83 0.97 0.90 0.91  CALCIUM  8.6* 8.5*  --  8.6* 8.4* 8.4* 8.3* 8.7*  PHOS 3.3 2.1*  --  2.7 2.9 3.9 3.6 3.8   CBC Recent Labs  Lab 01/26/24 0334 01/27/24 0545 01/29/24 0510 01/30/24 0330  WBC 11.8* 12.8* 17.4* 16.9*  NEUTROABS  --   --  14.2* 14.1*  HGB 8.5* 9.0* 9.1* 9.6*  HCT 25.6* 26.6* 28.1* 29.7*  MCV 101.2* 99.6 105.6* 106.8*  PLT 133* 109* 170 208    Medications:     Chlorhexidine  Gluconate Cloth  6 each Topical Daily   heparin   5,000 Units Subcutaneous Q8H   insulin  aspart  0-6 Units Subcutaneous Q4H   insulin  glargine-yfgn  6 Units Subcutaneous Daily   methylPREDNISolone  (SOLU-MEDROL ) injection  10 mg Intravenous Daily   pantoprazole  (PROTONIX ) IV  40 mg Intravenous Daily   sodium chloride  flush  10-40 mL Intracatheter Q12H   tacrolimus   0.5 mg Sublingual BID    Gordy Blanch, MD 01/30/2024, 8:47 AM

## 2024-01-30 NOTE — Evaluation (Signed)
 Occupational Therapy Evaluation Patient Details Name: Sherri Todd MRN: [id number] DOB: 03/30/1960 Today's Date: 01/30/2024   History of Present Illness   Pt is a 63 y.o female presented to ED 11/17 for R sided abdominal pain. CT showed small bowel obstruction. Intubated 11/17 for S/p ex lap, resection of distal ileum and ascending colon, explant of transplanted pancreas, temporary closure. 11/18 S/p reopening of prior laparotomy,  ileocolonic anastamosis at proximal transverse colon, suture repair upon closure of umbilical hernia. Extubated 11/19. PMH:  kidney-pancreas transplant 1999, HTN, CVA, DM     Clinical Impressions Pt was with nursing care at the sink and required close CGA with RW to ambulate to chair with mod-max cues on how to sequence with walker. She then wanted to complete hair care but requesting to sit as feeling fatigued. She then worked on while sitting position post set up. Pt remained on RA with o2 in 90%. Patient will benefit from intensive inpatient follow-up therapy, >3 hours/day.      If plan is discharge home, recommend the following:   A lot of help with bathing/dressing/bathroom;A lot of help with walking and/or transfers;Assistance with cooking/housework;Direct supervision/assist for medications management;Direct supervision/assist for financial management;Assist for transportation;Help with stairs or ramp for entrance;Supervision due to cognitive status     Functional Status Assessment         Equipment Recommendations   BSC/3in1     Recommendations for Other Services         Precautions/Restrictions   Precautions Precautions: Fall;Other (comment) Recall of Precautions/Restrictions: Impaired Precaution/Restrictions Comments: Abdominal for comfort Restrictions Weight Bearing Restrictions Per Provider Order: No     Mobility Bed Mobility               General bed mobility comments: presented at sink    Transfers Overall  transfer level: Needs assistance Equipment used: Rolling walker (2 wheels) Transfers: Sit to/from Stand Sit to Stand: Contact guard assist           General transfer comment: Pt requires mod cues on how to sequence      Balance Overall balance assessment: Needs assistance Sitting-balance support: Feet supported Sitting balance-Leahy Scale: Fair     Standing balance support: No upper extremity supported, Bilateral upper extremity supported Standing balance-Leahy Scale: Fair Standing balance comment: reliant on UE support                           ADL either performed or assessed with clinical judgement   ADL Overall ADL's : Needs assistance/impaired Eating/Feeding: Set up;Sitting   Grooming: Set up;Sitting   Upper Body Bathing: Set up;Sitting                           Functional mobility during ADLs: Contact guard assist;Rolling walker (2 wheels);Cueing for safety;Cueing for sequencing       Vision   Vision Assessment?: No apparent visual deficits     Perception         Praxis         Pertinent Vitals/Pain Pain Assessment Pain Assessment: Faces Faces Pain Scale: Hurts little more Facial Expression: Tense Body Movements: Protection Muscle Tension: Relaxed Compliance with ventilator (intubated pts.): N/A Vocalization (extubated pts.): N/A CPOT Total: 2 Pain Location: Abdomen Pain Descriptors / Indicators: Discomfort, Grimacing Pain Intervention(s): Limited activity within patient's tolerance, Monitored during session, Repositioned     Extremity/Trunk Assessment Upper Extremity Assessment Upper Extremity Assessment: Generalized  weakness   Lower Extremity Assessment Lower Extremity Assessment: Defer to PT evaluation       Communication Communication Communication: No apparent difficulties   Cognition Arousal: Alert Behavior During Therapy: Flat affect Cognition: Cognition impaired         Attention impairment (select  first level of impairment): Alternating attention, Divided attention Executive functioning impairment (select all impairments): Problem solving OT - Cognition Comments: Pt noted to need cues on saftey with IV line use                 Following commands: Intact       Cueing  General Comments   Cueing Techniques: Verbal cues  spouse presnt throughout session   Exercises     Shoulder Instructions      Home Living                                          Prior Functioning/Environment                      OT Problem List:     OT Treatment/Interventions:        OT Goals(Current goals can be found in the care plan section)   Acute Rehab OT Goals Patient Stated Goal: to get better OT Goal Formulation: With patient Time For Goal Achievement: 02/08/24 Potential to Achieve Goals: Good ADL Goals Pt Will Perform Grooming: with supervision;standing Pt Will Perform Lower Body Dressing: with supervision;sit to/from stand;sitting/lateral leans Pt Will Transfer to Toilet: with supervision;ambulating;regular height toilet Pt Will Perform Toileting - Clothing Manipulation and hygiene: with supervision;sitting/lateral leans;sit to/from stand Additional ADL Goal #1: Pt will verbalize at least 3 energy conservation strategies for ADLs   OT Frequency:  Min 2X/week    Co-evaluation              AM-PAC OT 6 Clicks Daily Activity     Outcome Measure Help from another person eating meals?: None Help from another person taking care of personal grooming?: A Little Help from another person toileting, which includes using toliet, bedpan, or urinal?: A Lot Help from another person bathing (including washing, rinsing, drying)?: A Lot Help from another person to put on and taking off regular upper body clothing?: A Little Help from another person to put on and taking off regular lower body clothing?: A Lot 6 Click Score: 16   End of Session Equipment  Utilized During Treatment: Rolling walker (2 wheels);Gait belt Nurse Communication: Mobility status  Activity Tolerance: Patient limited by fatigue Patient left: in chair;with call bell/phone within reach;with chair alarm set  OT Visit Diagnosis: Unsteadiness on feet (R26.81);Other abnormalities of gait and mobility (R26.89);Muscle weakness (generalized) (M62.81)                Time: 8954-8885 OT Time Calculation (min): 29 min Charges:  OT General Charges $OT Visit: 1 Visit OT Evaluation $OT Eval Low Complexity: 1 Low OT Treatments $Self Care/Home Management : 23-37 mins  Warrick POUR OTR/L  Acute Rehab Services  [phone] office number   Warrick Berber 01/30/2024, 11:33 AM

## 2024-01-30 NOTE — Progress Notes (Addendum)
   01/30/24 0456  Provider Notification  Provider Name/Title Dr. Franky  Date Provider Notified 01/30/24  Time Provider Notified [phone]  Method of Notification Page  Notification Reason Pt cries and reports, she has not been able to sleep well for several nights. She feels like having more anxiety, irritable, and depression. Emotional support is provided.   Pt requests sleeping aid and medication for  depression and anxiety. She asks for melatonin liquid form for sublingual if she can have for her sleeping.  For her anxiety and depression, she used to take Prozac  prior this admission, but she reports  it did not really effective.  This time she requests Xanax for a short term if she can have.   Pt is NPO, she only can take ice chips. She is on NG tube with low intermittent suction. It would be very helpful if medications are IV form or liquid  for sublingual.   Provider response Evaluate remotely;See new orders  Date of Provider Response 01/30/24  Time of Provider Response 0459   Wendi Dash, RN

## 2024-01-30 NOTE — Progress Notes (Signed)
 Progress Note  7 Days Post-Op  Subjective: Denies nausea. NG output 400, with PO intake. Patient says she is still passing flatus. WBC stable at 16.9.  Objective: Vital signs in last 24 hours: Temp:  [97.8 F (36.6 C)-99.4 F (37.4 C)] 97.8 F (36.6 C) (11/25 0257) Pulse Rate:  [80-87] 82 (11/25 0257) Resp:  [18-25] 20 (11/25 0257) BP: (105-150)/(54-89) 150/89 (11/25 0257) SpO2:  [95 %-100 %] 100 % (11/25 0257) Weight:  [45.3 kg] 45.3 kg (11/25 0257) Last BM Date : 01/29/24  Intake/Output from previous day: 11/24 0701 - 11/25 0700 In: 1426.7 [P.O.:250; I.V.:1176.7] Out: 750 [Urine:350; Emesis/NG output:400] Intake/Output this shift: No intake/output data recorded.  PE: General: resting in bed, NAD Heart: RRR Lungs: nonlabored respirations HEENT: NG tube with thin light brown output Abd: soft, nondistended. Midline wound is open at the skin, wound bed is clean and dry, fascia in tact. GU: foley present with straw colored urine     Lab Results:  Recent Labs    01/29/24 0510 01/30/24 0330  WBC 17.4* 16.9*  HGB 9.1* 9.6*  HCT 28.1* 29.7*  PLT 170 208   BMET Recent Labs    01/29/24 0510 01/30/24 0330  NA 136 134*  K 5.0 5.1  CL 102 103  CO2 24 21*  GLUCOSE 300* 341*  BUN 25* 28*  CREATININE 0.90 0.91  CALCIUM  8.3* 8.7*   PT/INR No results for input(s): LABPROT, INR in the last 72 hours.  CMP     Component Value Date/Time   NA 134 (L) 01/30/2024 0330   K 5.1 01/30/2024 0330   CL 103 01/30/2024 0330   CO2 21 (L) 01/30/2024 0330   GLUCOSE 341 (H) 01/30/2024 0330   BUN 28 (H) 01/30/2024 0330   CREATININE 0.91 01/30/2024 0330   CALCIUM  8.7 (L) 01/30/2024 0330   PROT 4.9 (L) 01/29/2024 0510   ALBUMIN  2.3 (L) 01/29/2024 0510   AST 27 01/29/2024 0510   ALT 32 01/29/2024 0510   ALKPHOS 53 01/29/2024 0510   BILITOT 0.7 01/29/2024 0510   GFRNONAA >60 01/30/2024 0330   Lipase  No results found for: LIPASE     Studies/Results: DG  CHEST PORT 1 VIEW Result Date: 01/22/2024 EXAM: 1 VIEW(S) XRAY OF THE CHEST 01/22/2024 12:25:00 AM COMPARISON: None available. CLINICAL HISTORY: History of ETT [id number]; Encounter for orogastric (OG) tube placement [id number] FINDINGS: LINES, TUBES AND DEVICES: Endotracheal tube with tip extended 2 cm down the right main bronchus and needs to be withdrawn 5 cm to the mid trachea. Nasogastric tube has been inserted and terminates in the distal body of the stomach. LUNGS AND PLEURA: Vascular markings are normal. No focal pneumonia is seen. No pleural effusion. No pneumothorax. HEART AND MEDIASTINUM: There is mild chronic cardiomegaly. The mediastinum is normally outlined. BONES AND SOFT TISSUES: A bone island is again noted in the anterior right 3rd rib. Slight thoracic dextroscoliosis. IMPRESSION: 1. Endotracheal tube malposition with tip 2 cm into the right main bronchus; recommend withdrawal by 5 cm to the mid trachea. 2. Mild chronic cardiomegaly. 3. No evidence of chf or other acute chest process. 4. These results will be telephoned to the referring provider or the referring providers representative by professional radiology assistant [facility]) personnel, with communication documented in the [facility] dashboard. Electronically signed by: Francis Quam MD 01/22/2024 07:20 AM EST RP Workstation: [id number]   DG Abd Portable 1 View Result Date: 01/22/2024 EXAM: 1 VIEW XRAY OF THE ABDOMEN 01/22/2024 01:49:00 AM COMPARISON: None  available. CLINICAL HISTORY: NG tube placement. FINDINGS: LINES, TUBES AND DEVICES: Nasogastric tube tip overlies the distal body of the stomach. BOWEL: Dilated loops of small bowel were seen throughout the visualized abdomen in keeping with a distal small bowel obstruction. No free air. SOFT TISSUES: No opaque urinary calculi. BONES: No acute osseous abnormality. IMPRESSION: 1. Dilated loops of small bowel consistent with distal small bowel obstruction. No free air. 2. Nasogastric tube tip overlies  the distal body of the stomach. Electronically signed by: Dorethia Molt MD 01/22/2024 02:00 AM EST RP Workstation: [id number]   CT ABDOMEN PELVIS WO CONTRAST Addendum Date: 01/22/2024 ADDENDUM #1 ADDENDUM: The critical findings of this study were discussed directly with Dr. Bari by myself at 1:37 am Electronically signed by: Dorethia Molt MD 01/22/2024 01:37 AM EST RP Workstation: [id number]   Result Date: 01/22/2024  ORIGINAL REPORT  EXAM: CT ABDOMEN AND PELVIS WITHOUT CONTRAST 01/22/2024 01:00:39 AM TECHNIQUE: CT of the abdomen and pelvis was performed without the administration of intravenous contrast. Multiplanar reformatted images are provided for review. Automated exposure control, iterative reconstruction, and/or weight-based adjustment of the mA/kV was utilized to reduce the radiation dose to as low as reasonably achievable. COMPARISON: None available. CLINICAL HISTORY: Abdominal pain, acute, nonlocalized; Sepsis. FINDINGS: LOWER CHEST: Extensive coronary artery calcifications. The distal esophagus is fluid filled with changes of esophageal dysmotility or gastroesophageal reflux. Superimposed circumferential wall thickening may reflect changes of reflux esophagitis. LIVER: The liver is unremarkable. GALLBLADDER AND BILE DUCTS: Gallbladder is unremarkable. No biliary ductal dilatation. SPLEEN: No acute abnormality. PANCREAS: No acute abnormality. ADRENAL GLANDS: No acute abnormality. KIDNEYS, URETERS AND BLADDER: Native kidneys are markedly atrophic in keeping with given history of end-stage renal disease. Transplant kidney noted within the left iliac fossa with small perinephric fluid collection present also representing a postoperative seroma, lymphocele, or chronic hematoma. No significant mass effect on the transplant kidney. No stones in the kidneys or ureters. No hydronephrosis. No perinephric or periureteral stranding. Urinary bladder is unremarkable. GI AND BOWEL: There is a distal small  bowel obstruction present with numerous loops of more proximal dilated, fluid-filled loops of small bowel as well as fluid distention of the stomach and duodenum. There is a whirl appearance of the mesenteric vasculature in keeping with a mesenteric volvulus best appreciated on image 51 - 56, series 3. The point of obstruction occurs just distal to an anastomotic line noted within the mid pelvis. The terminal ileum and colon are decompressed. PERITONEUM AND RETROPERITONEUM: Trace ascites. No free intraperitoneal gas. VASCULATURE: Aorta is normal in caliber. LYMPH NODES: No lymphadenopathy. REPRODUCTIVE ORGANS: Uterus is unremarkable. No adnexal masses. BONES AND SOFT TISSUES: Osseous structures are age appropriate. No acute bone abnormality. No lytic or blastic bone lesion. Small fat-containing umbilical hernia. IMPRESSION: 1. 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 No free intraperitoneal gas. Trace ascites. 2. Changes of esophageal dysmotility or gastroesophageal reflux with superimposed circumferential distal esophageal wall thickening, possibly reflecting reflux esophagitis. 3. Transplant kidney in the left iliac fossa with small perinephric fluid collection, possibly representing a postoperative seroma, lymphocele, or chronic hematoma. No significant mass effect on the transplant kidney. 4. Reported pancreatic transplant is not well appreciated on this examination due to noncontrast technique and superimposed pathology. Electronically signed by: Dorethia Molt MD 01/22/2024 01:31 AM EST RP Workstation: [id number]   DG Chest Port 1 View if patient is in a treatment room. Result Date: 01/22/2024 EXAM: 1 VIEW(S) XRAY OF THE CHEST 01/22/2024 12:46:00 AM COMPARISON: Chest  x-ray 05/16/2022, CT right shoulder 01/17/2015. CLINICAL HISTORY: Suspected Sepsis. FINDINGS: LUNGS AND PLEURA: Stable chronic 6 mm peripheral right upper lobe nodule. No focal pulmonary opacity. No pleural  effusion. No pneumothorax. HEART AND MEDIASTINUM: No acute abnormality of the cardiac and mediastinal silhouettes. BONES AND SOFT TISSUES: No acute osseous abnormality. IMPRESSION: 1. No acute cardiopulmonary process. Electronically signed by: Morgane Naveau MD 01/22/2024 12:55 AM EST RP Workstation: [id number]    Anti-infectives: Anti-infectives (From admission, onward)    Start     Dose/Rate Route Frequency Ordered Stop   01/24/24 1400  piperacillin -tazobactam (ZOSYN ) IVPB 3.375 g        3.375 g 12.5 mL/hr over 240 Minutes Intravenous Every 8 hours 01/24/24 0957     01/22/24 1200  piperacillin -tazobactam (ZOSYN ) IVPB 2.25 g  Status:  Discontinued        2.25 g 100 mL/hr over 30 Minutes Intravenous Every 6 hours 01/22/24 9386 01/24/24 0957   01/21/24 2330  ceFEPIme  (MAXIPIME ) 2 g in sodium chloride  0.9 % 100 mL IVPB        2 g 200 mL/hr over 30 Minutes Intravenous  Once 01/21/24 2316 01/21/24 2356   01/21/24 2330  metroNIDAZOLE  (FLAGYL ) IVPB 500 mg        500 mg 100 mL/hr over 60 Minutes Intravenous  Once 01/21/24 2316 01/22/24 0109        Assessment/Plan  Hx of kidney-pancreas transplant 1999, followed at [facility]  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 Dasie  POD7 ex-lap, abdominal washout, ileocolic anastomosis, abdominal closure Dr. Aron - Remove NG tube - Advance to clear liquid diet - Continue TPN - Has completed 7 days abx postop. WBC elevated but no other signs of infection, may be related to steroids. Will stop Zosyn  today. - BID WTD to midline wound - T1DM s/p explant of transplanted pancreas: blood sugars have been labile, on low dose Semglee  and sliding scale. CGM placed yesterday. - Remove foley (has been in place since surgery, renal function remains stable).     LOS: 8 days    Leonor LITTIE Dasie, MD  [facility] 01/30/2024, 7:37 AM Please see Amion for pager number during day hours 7:00am-4:30pm

## 2024-01-30 NOTE — Inpatient Diabetes Management (Addendum)
 Inpatient Diabetes Program Recommendations  AACE/ADA: New Consensus Statement on Inpatient Glycemic Control   Target Ranges:  Prepandial:   less than 140 mg/dL      Peak postprandial:   less than 180 mg/dL (1-2 hours)      Critically ill patients:  140 - 180 mg/dL   Lab Results  Component Value Date   GLUCAP 252 (H) 01/30/2024   HGBA1C 5.0 01/22/2024    Latest Reference Range & Units 01/29/24 18:00 01/29/24 19:39 01/29/24 23:37 01/30/24 02:55 01/30/24 07:44 01/30/24 10:54  Glucose-Capillary 70 - 99 mg/dL 618 (H) 669 (H) 791 (H) 283 (H) 252 (H) 193 (H)   Review of Glycemic Control  Diabetes history: DM1 (ex-transplanted pancreas)   Outpatient Diabetes medications: None; had been on insulin  prior to pancreas transplant in past   Current orders for Inpatient glycemic control:  Semglee  6 units daily  Novolog  0-6 units Q4HRS   Solu-medrol  10mg  daily    Inpatient Diabetes Program Recommendations:  Per pharmacy: reduced dextrose  in TPN from ~211 grams to ~175 grams   Please consider increasing Semglee  to 10 units daily.   Addendum @ 1200: Spoke with patient at bedside. Patient was very grateful for the support and education our team has provided. Noted patient refused sliding scale insulin  yesterday - discussed with pt. She states she is afraid of having another hypoglycemic event. I educated patient on the importance of taking insulin  to prevent hyperglycemia and better determine what insulin  regimen is appropriate for discharge. I encouraged patient to use her CGM that was initiated yesterday and monitor her CBG closely. If she notices it trending down, she can make bedside RN aware and we can be proactive in preventing hypoglycemia.  Noted CBG improved today; however, patient states she refused her IV steroids. Patient wishes to discuss this with MD because she does not like the way the steroids makes her feel and cause hyperglycemia.  Patient is aware she will be discharged on  insulin  and is familiar with insulin  pens but not confident in how to administer insulin  to herself. Educated patient on insulin  pen use at home. Reviewed all steps of insulin  pen including attachment of needle, 2-unit air shot, dialing up dose, giving injection, removing needle, disposal of sharps, storage of unused insulin , disposal of insulin  etc. Also reviewed troubleshooting with insulin  pen. MD to give patient Rxs for insulin  pens and insulin  pen needles upon discharge.   Thanks,  Lavanda Search, RN, MSN, [facility]  Inpatient Diabetes Coordinator  Pager [phone] (8a-5p)

## 2024-01-30 NOTE — Progress Notes (Signed)
 PHARMACY - TOTAL PARENTERAL NUTRITION CONSULT NOTE   Indication: Prolonged ileus  Patient Measurements: Height: 5' 1 (154.9 cm) Weight: 45.3 kg (99 lb 13.9 oz) IBW/kg (Calculated) : 47.8 TPN AdjBW (KG): 51.6 Body mass index is 18.87 kg/m. Usual Weight: 49 kg  Assessment: 63 years of  age female with history of kidney and pancreas transplant 12/1997 followed at [facility] admitted for abdominal pain, nausea, and hypotension found to have internal hernia with small bowel volvulus and necrosis of terminal ileum and ascending colon requiring emergent laparotomy for resection of distal ileum and ascending colon, explant of transplanted pancreas, and ABThera VAC placement on 11/17 and returned for abdominal washout, ileocolic anastomosis, and abdominal closure 11/18. Patient was extubated 11/19. Patient is having bowel function but also nausea and vomiting with likely ileus. NGT placed to low-intermittent suction and patient made NPO. Pharmacy consulted for TPN.   Patient has a noted intolerance to soy as nausea - we will use SMOFlipids, which of our two lipid choices, has the least amount of soybean oil and monitor tolerance.   Glucose / Insulin : CBG 208-422. MD reduced Semglee  to 6 units daily. Used 24 units of SSI and 8 units semglee  in last 24 hours. Solumedrol 10mg  daily.   Patient has been intermittently refusing sliding scale insulin   Monitoring patient response closely in setting of removed transplanted pancreas. DM Coordinator added CGM monitor on 11/24.   Electrolytes: Na 134, K 5.1 (goal >/=4), Ch 103, Ca 8.7 [CoCa 10.06], Phos 3.8, Mg 2.1 (goal >/= 2) Renal: SCr  0.91, BUN 28 Hepatic: LFTs/Tbili/TG within normal limits. Albumin  2.3.  Intake / Output; MIVF: UOP 0.32 mL/kg/hr. NGT output 400 mL. LBM 11/24 GI Imaging: 11/17 KUB - dilated small bowel loops consistent with obstruction 11/17 CT Abd - distal small bowel obstruction d/t mesenteric volvulus, no free gas, trace ascites 11/21  KUB - pending GI Surgeries / Procedures:  11/17 Exlap resection of distal ileum and ascending colon, explant of transplanted pancreas, and ABThera VAC placement  11/18 abdominal washout, ileocolic anastomosis, and abdominal closure  Central access: 01/26/24 TPN start date: 01/26/24  Nutritional Goals: Goal TPN rate is 65 mL/hr (provides 83 g of protein and 1515 kcals per day)  RD Assessment: Estimated Needs Total Energy Estimated Needs: 1500-1700 kcals Total Protein Estimated Needs: 75-90 g Total Fluid Estimated Needs: 1.5L  Current Nutrition:  NPO and TPN *Will need pancreatic enzymes once taking enteral diet  Plan:  Continue TPN  65 mL/hr at 1800- providing ~90 grams of protein and 1502 Kcal meeting 100% of needs  *This TPN provides ~174.72 g dextrose .  Electrolytes in TPN: Na 55 mEq/L, K 45 mEq/L, Ca 5 mEq/L, Mg 8 mEq/L, and Phos 20 mmol/L. Cl:Ac 1:1 Add standard MVI and trace elements to TPN Add Thiamine 100 mg to TPN - through 02/02/24. Continue very Sensitive q4h SSI and basal insulin  with Semglee  6 units daily per MD and DM Coordinator.  Add 8 units regular insulin  to TPN - DM Coordinator and MD aware Monitor TPN labs on Mon/Thurs, prn    Ashwath Lasch BS, PharmD, BCPS Clinical Pharmacist 01/30/2024 7:59 AM  Contact: [phone] after 3 PM

## 2024-01-30 NOTE — Progress Notes (Signed)
 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 graduate diet as tolerated Leukocytosis could be related to IV steroids she does not appear toxic nor is she febrile--- antibiotics stopped 11/25 Severe hypokalemia now mild hyperkalemia  TPN replacement /pharm Watch potassium as is becoming a little hyperkalemic Diabetes mellitus type 1 status post transplant on CellCept  tacrolimus  and prednisone  Brittle diabetes with hypoglycemia Solu-Cortef  --> Solu-Medrol  10 daily, continues tacrolimus  sublingually 0.5 twice daily Patient had variable sugars yesterday and was refusing to take some of her insulin  This has been enforced to her and she is currently on very sensitive sliding scale in addition to 6 units of Semglee  insulin  Hopefully when she comes off of the Solu-Medrol  in the next several days and goes back onto her home prednisone  her sugars will be improved Anemia of expected blood loss from surgery-underlying macrocytosis and mild thrombocytopenia Watch labs periodically---hemoglobin is stable--mild thrombocytopenia has completely resolved  now Stroke complicating transplant as above-fully recovered at the time No persisting current focal deficits Moderate controlled hypertension As needed hydralazine  ordered 5 mg IV for blood pressure above 160-intermittently well-controlled AKI on admission secondary to sepsis Resolving well Depression Prozac  resumed.today Because she is not sleeping I have added a low-dose of Ambien  tonight  Data Reviewed today:   BMET    Component Value Date/Time   NA 134 (L) 01/30/2024 0330   K 5.1 01/30/2024 0330   CL 103 01/30/2024 0330   CO2 21 (L) 01/30/2024 0330   GLUCOSE 341 (H) 01/30/2024 0330   BUN 28 (H) 01/30/2024 0330   CREATININE 0.91 01/30/2024 0330   CALCIUM  8.7 (L) 01/30/2024 0330   GFRNONAA >60 01/30/2024 0330   CBC    Component Value Date/Time   WBC 16.9 (H) 01/30/2024 0330   RBC 2.78 (L) 01/30/2024 0330   HGB 9.6 (L) 01/30/2024 0330   HCT 29.7 (L) 01/30/2024 0330   PLT 208 01/30/2024 0330   MCV 106.8 (H) 01/30/2024 0330   MCH 34.5 (H) 01/30/2024 0330   MCHC 32.3 01/30/2024 0330   RDW 17.4 (H) 01/30/2024 0330   LYMPHSABS 1.1 01/30/2024 0330   MONOABS 1.1 (H) 01/30/2024 0330   EOSABS 0.1 01/30/2024 0330   BASOSABS 0.1 01/30/2024 0330     DVT prophylaxis: Heparin   Status is: Inpatient Inpatient pending resolution    Dispo/Global plan: Unclear at this time has an ileus defer to general surgery   Time 20   Subjective:   Looks much better no fever no chills no nausea no vomiting having soup Note blood sugar trends overnight and change  Objective + exam Vitals:   01/30/24 0257 01/30/24 0745 01/30/24 1056 01/30/24 1613  BP: ROLLEN)  150/89 (!) 140/66 105/67 133/67  Pulse: 82 84 90 78  Resp: 20 (!) 22  19  Temp: 97.8 F (36.6 C) 97.6 F (36.4 C) 98 F (36.7 C) 97.7 F (36.5 C)  TempSrc: Oral Oral Oral Oral  SpO2: 100% 98%  98%  Weight: 45.3 kg     Height:       Filed Weights   01/27/24 0409 01/28/24 0420 01/30/24 0257  Weight: 33.2 kg 43.2 kg 45.3 kg      Examination:   coherent awake alert no distress EOMI NCAT NG tube out Chest is clear Abdomen less distended to my exam  Scheduled Meds:  Chlorhexidine  Gluconate Cloth  6 each Topical Daily   FLUoxetine   20 mg Oral Daily   heparin   5,000 Units Subcutaneous Q8H   insulin  aspart  0-6 Units Subcutaneous Q4H   insulin  glargine-yfgn  6 Units Subcutaneous Daily   methylPREDNISolone  (SOLU-MEDROL ) injection  10 mg Intravenous Daily   pantoprazole  (PROTONIX ) IV  40 mg Intravenous Daily   sodium chloride  flush  10-40 mL Intracatheter Q12H   tacrolimus   0.5 mg Sublingual BID   zolpidem   5 mg Oral QHS   Continuous Infusions:  TPN ADULT (ION) 65 mL/hr at 01/29/24 1805   TPN ADULT (ION)     acetaminophen , albuterol , dextrose , diphenhydrAMINE , hydrALAZINE , HYDROmorphone  (DILAUDID ) injection, labetalol , ondansetron  (ZOFRAN ) IV, mouth rinse, sodium chloride  flush  Colen Grimes, MD  [facility]

## 2024-01-30 NOTE — Progress Notes (Signed)
 Pt alert with confusion. Doesn't know where she is. Reoriented to location. She is talking to her self and her conversation doesn't make sense. Frequent transfers to  [facility] to bowel movement. Some incontinent episodes as well. Bed alarm in use with frequent visual contacts for safety. Will continue to monitor.

## 2024-01-31 ENCOUNTER — Inpatient Hospital Stay (HOSPITAL_COMMUNITY)

## 2024-01-31 DIAGNOSIS — E1069 Type 1 diabetes mellitus with other specified complication: Secondary | ICD-10-CM

## 2024-01-31 DIAGNOSIS — N179 Acute kidney failure, unspecified: Secondary | ICD-10-CM

## 2024-01-31 DIAGNOSIS — I10 Essential (primary) hypertension: Secondary | ICD-10-CM | POA: Diagnosis not present

## 2024-01-31 DIAGNOSIS — F32A Depression, unspecified: Secondary | ICD-10-CM

## 2024-01-31 DIAGNOSIS — E440 Moderate protein-calorie malnutrition: Secondary | ICD-10-CM

## 2024-01-31 DIAGNOSIS — E876 Hypokalemia: Secondary | ICD-10-CM | POA: Insufficient documentation

## 2024-01-31 DIAGNOSIS — K559 Vascular disorder of intestine, unspecified: Secondary | ICD-10-CM | POA: Diagnosis not present

## 2024-01-31 DIAGNOSIS — E109 Type 1 diabetes mellitus without complications: Secondary | ICD-10-CM

## 2024-01-31 DIAGNOSIS — Z8673 Personal history of transient ischemic attack (TIA), and cerebral infarction without residual deficits: Secondary | ICD-10-CM

## 2024-01-31 LAB — GLUCOSE, CAPILLARY
Glucose-Capillary: 108 mg/dL — ABNORMAL HIGH (ref 70–99)
Glucose-Capillary: 123 mg/dL — ABNORMAL HIGH (ref 70–99)
Glucose-Capillary: 140 mg/dL — ABNORMAL HIGH (ref 70–99)
Glucose-Capillary: 247 mg/dL — ABNORMAL HIGH (ref 70–99)
Glucose-Capillary: 326 mg/dL — ABNORMAL HIGH (ref 70–99)
Glucose-Capillary: 49 mg/dL — ABNORMAL LOW (ref 70–99)
Glucose-Capillary: 64 mg/dL — ABNORMAL LOW (ref 70–99)
Glucose-Capillary: 95 mg/dL (ref 70–99)

## 2024-01-31 LAB — CBC WITH DIFFERENTIAL/PLATELET
Abs Immature Granulocytes: 0.54 K/uL — ABNORMAL HIGH (ref 0.00–0.07)
Basophils Absolute: 0 K/uL (ref 0.0–0.1)
Basophils Relative: 0 %
Eosinophils Absolute: 0.3 K/uL (ref 0.0–0.5)
Eosinophils Relative: 2 %
HCT: 24.7 % — ABNORMAL LOW (ref 36.0–46.0)
Hemoglobin: 8.1 g/dL — ABNORMAL LOW (ref 12.0–15.0)
Immature Granulocytes: 3 %
Lymphocytes Relative: 6 %
Lymphs Abs: 1.3 K/uL (ref 0.7–4.0)
MCH: 34.3 pg — ABNORMAL HIGH (ref 26.0–34.0)
MCHC: 32.8 g/dL (ref 30.0–36.0)
MCV: 104.7 fL — ABNORMAL HIGH (ref 80.0–100.0)
Monocytes Absolute: 1.5 K/uL — ABNORMAL HIGH (ref 0.1–1.0)
Monocytes Relative: 7 %
Neutro Abs: 17.4 K/uL — ABNORMAL HIGH (ref 1.7–7.7)
Neutrophils Relative %: 82 %
Platelets: 220 K/uL (ref 150–400)
RBC: 2.36 MIL/uL — ABNORMAL LOW (ref 3.87–5.11)
RDW: 16.7 % — ABNORMAL HIGH (ref 11.5–15.5)
WBC: 21 K/uL — ABNORMAL HIGH (ref 4.0–10.5)
nRBC: 0.1 % (ref 0.0–0.2)

## 2024-01-31 LAB — BASIC METABOLIC PANEL WITH GFR
Anion gap: 8 (ref 5–15)
BUN: 28 mg/dL — ABNORMAL HIGH (ref 8–23)
CO2: 21 mmol/L — ABNORMAL LOW (ref 22–32)
Calcium: 8.3 mg/dL — ABNORMAL LOW (ref 8.9–10.3)
Chloride: 104 mmol/L (ref 98–111)
Creatinine, Ser: 0.84 mg/dL (ref 0.44–1.00)
GFR, Estimated: >60 mL/min (ref >60)
Glucose, Bld: 122 mg/dL — ABNORMAL HIGH (ref 70–99)
Potassium: 4.2 mmol/L (ref 3.5–5.1)
Sodium: 133 mmol/L — ABNORMAL LOW (ref 135–145)

## 2024-01-31 LAB — MAGNESIUM: Magnesium: 1.8 mg/dL (ref 1.7–2.4)

## 2024-01-31 MED ORDER — MYCOPHENOLATE MOFETIL 250 MG PO CAPS
500.0000 mg | ORAL_CAPSULE | Freq: Two times a day (BID) | ORAL | Status: DC
  Administered 2024-01-31 – 2024-02-06 (×13): 500 mg via ORAL
  Filled 2024-01-31 (×14): qty 2

## 2024-01-31 MED ORDER — PREDNISONE 5 MG PO TABS
5.0000 mg | ORAL_TABLET | Freq: Every day | ORAL | Status: DC
  Administered 2024-01-31 – 2024-02-06 (×7): 5 mg via ORAL
  Filled 2024-01-31 (×7): qty 1

## 2024-01-31 MED ORDER — CALCIUM POLYCARBOPHIL 625 MG PO TABS
625.0000 mg | ORAL_TABLET | Freq: Every day | ORAL | Status: DC
  Administered 2024-01-31 – 2024-02-06 (×7): 625 mg via ORAL
  Filled 2024-01-31 (×7): qty 1

## 2024-01-31 MED ORDER — TRAVASOL 10 % IV SOLN
INTRAVENOUS | Status: AC
  Filled 2024-01-31: qty 904.8

## 2024-01-31 MED ORDER — KATE FARMS STANDARD 1.4 EN LIQD
325.0000 mL | Freq: Two times a day (BID) | ENTERAL | Status: DC
  Administered 2024-01-31 – 2024-02-01 (×2): 325 mL via ORAL
  Filled 2024-01-31 (×11): qty 325

## 2024-01-31 MED ORDER — IOHEXOL 9 MG/ML PO SOLN
500.0000 mL | ORAL | Status: AC
  Administered 2024-01-31 (×2): 500 mL via ORAL

## 2024-01-31 MED ORDER — IOHEXOL 350 MG/ML SOLN
75.0000 mL | Freq: Once | INTRAVENOUS | Status: AC | PRN
  Administered 2024-01-31: 75 mL via INTRAVENOUS

## 2024-01-31 MED ORDER — MAGNESIUM SULFATE 2 GM/50ML IV SOLN
2.0000 g | Freq: Once | INTRAVENOUS | Status: DC
  Filled 2024-01-31: qty 50

## 2024-01-31 MED ORDER — SODIUM CHLORIDE 0.9 % IV BOLUS
1000.0000 mL | Freq: Once | INTRAVENOUS | Status: AC
  Administered 2024-01-31: 1000 mL via INTRAVENOUS

## 2024-01-31 MED ORDER — TACROLIMUS 1 MG PO CAPS
1.0000 mg | ORAL_CAPSULE | Freq: Two times a day (BID) | ORAL | Status: DC
  Administered 2024-01-31 – 2024-02-06 (×13): 1 mg via ORAL
  Filled 2024-01-31 (×14): qty 1

## 2024-01-31 MED ORDER — TRAMADOL HCL 50 MG PO TABS
50.0000 mg | ORAL_TABLET | Freq: Four times a day (QID) | ORAL | Status: DC | PRN
Start: 2024-01-31 — End: 2024-02-01

## 2024-01-31 NOTE — Assessment & Plan Note (Addendum)
 Positive anxiety  Plan to continue fluoxetine   No further confusion.  Continue supportive therapy

## 2024-01-31 NOTE — Progress Notes (Signed)
 Nutrition Follow-up  DOCUMENTATION CODES:  Non-severe (moderate) malnutrition in context of chronic illness  INTERVENTION:  Continue TPN Management per pharmacy  Advance to full liquid diet  per Surgery Start calorie count once diet advanced to at least GI soft/low fiber to assess PO intake to start weaning TPN.  Start CREON TID wih meals, dosing units per hospitalist/pharmacy Add Sherri Todd 1.4 PO daily, each supplement provides 455 kcal and 20 grams protein. Can add Fiber supplement following diet advancement if patient continues to have liquid stool  NUTRITION DIAGNOSIS:  Moderate Malnutrition related to chronic illness (ESRD s/p pancreatic and kidney transplants) as evidenced by moderate fat depletion, severe muscle depletion. 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  PMH includes type 1 DM and ESRD s/p pancreas and kidney transplants in 1999, HTN, CVA. Pt is followed by [facility]  11/17 CT: SBO secondary to mesenteric volvulus; OR: Ex Lap, resection of distal ileum and ascending colon, explant of transplanted pancreas, open abdomen with wound VAC  11/18: OR- re-opening of prior laparotomy, ileocolonic anastomosis as proximal transverse colon, suture repair  upon closure of umbilical hernia 11/19 extubated; diet advanced to clear liquids 11/21 emesis; NGT back to suction; DG abdomen- decreased gaseous distension of small bowel loops; NPO; TPN initiation 11/24 NGT back to suction 11/25 - NGT removed   Patient seen in room with spouse at bedside.  Diet advanced to CLD yesterday, patient tolerating small amounts. Pt reports GI intolerance to soy lecithin in the past, avoids at home as well at soybean oil. TPN currently has SMOF lipids added as it has the least amount of soybean oil of the two ILE available for TPN. Pt reports that consumption of soy lecithin in the past has caused nausea. Pt has been on TPN x 7 days, reports that she was having N/V prior to admission but does feel that nausea has improved and not worsened with initiation of TPN, so unsure of pt is having a mild reaction to SMOF.   Surgery PA came in during visit to assess patient. Plan is to advance patient to full liquid diet today and monitor tolerance before possibly advancing to GI soft tomorrow. Pt will need to start CREON once diet is advance to full liquids. Discussed calorie count initiation once patient is on at least a low fiber diet to assess ability to wean TPN.     TPN regimen per Pharmacy: Continue TPN  65 mL/hr at 1800- providing ~90 grams of protein and 1502 Kcal meeting 100% of needs  TPN provides ~174.72 g dextrose  Electrolytes in TPN: Na 60 mEq/L, K 50 mEq/L, Ca 5 mEq/L, Mg 10 mEq/L, and Phos 20 mmol/L. Cl:Ac 1:1 Add standard MVI and trace elements to TPN Add Thiamine 100 mg to TPN - through 02/02/24. Continue very Sensitive q4h SSI and basal insulin  with Semglee  6 units daily per MD and DM Coordinator.  Reduce TPN regular insulin  to 7 units - DM Coordinator and MD aware Mag 2 gm iv x1   DM coordinator updated recommendations: Plan to provide CGM for best blood sugar management.  Continue  basal insulin  dose Low dose insulin  in the TPN  Admit weight: 47.6 kg Current weight: 45.3 kg  Medications:  SSI 0-6 units q 4 hrs, Semglee  6 units daily, protonix , prednisone   Labs: Na 133, Glu 122, BUN 28, Calcium  8.3    Diet Order:   Diet Order             Diet clear liquid Room service appropriate? Yes; Fluid consistency: Thin  Diet effective now                    EDUCATION NEEDS:  Not appropriate for education at this time  Skin:  Skin Assessment: Skin Integrity Issues: Skin Integrity Issues:: Wound VAC Wound Vac: abdomen removed  Last BM:  11/25, type 7  Height:  Ht Readings from Last 1 Encounters:  01/23/24 5' 1 (1.549 m)    Weight:  Wt Readings from Last 1 Encounters:  01/30/24 45.3 kg    BMI:  Body mass index is 18.87 kg/m.  Estimated Nutritional Needs:   Kcal:  1500-1700 kcals  Protein:  75-90 g  Fluid:  1.5L  Sherri Erven, MS, RD, LDN Clinical Dietitian  Contact via secure chat. If unavailable, use group chat RD Inpatient.

## 2024-01-31 NOTE — Progress Notes (Signed)
 Blood glucose 49 mg/dl at 9890. Given D50 per orders. Recheck now 95 mg/dl. Will continue to monitor

## 2024-01-31 NOTE — Progress Notes (Signed)
 PHARMACY - TOTAL PARENTERAL NUTRITION CONSULT NOTE   Indication: Prolonged ileus  Patient Measurements: Height: 5' 1 (154.9 cm) Weight: 45.3 kg (99 lb 13.9 oz) IBW/kg (Calculated) : 47.8 TPN AdjBW (KG): 51.6 Body mass index is 18.87 kg/m. Usual Weight: 49 kg  Assessment: 63 years of  age female with history of kidney and pancreas transplant 12/1997 followed at [facility] admitted for abdominal pain, nausea, and hypotension found to have internal hernia with small bowel volvulus and necrosis of terminal ileum and ascending colon requiring emergent laparotomy for resection of distal ileum and ascending colon, explant of transplanted pancreas, and ABThera VAC placement on 11/17 and returned for abdominal washout, ileocolic anastomosis, and abdominal closure 11/18. Patient was extubated 11/19. Patient is having bowel function but also nausea and vomiting with likely ileus. NGT placed to low-intermittent suction and patient made NPO. Pharmacy consulted for TPN.   Patient has a noted intolerance to soy as nausea - we will use SMOFlipids, which of our two lipid choices, has the least amount of soybean oil and monitor tolerance.   Glucose / Insulin : CBGs 49-252. Utilized 6 units of SSI and 8 units semglee  in last 24 hours. TPN contains 8 units of regular insulin . Solumedrol >> pred10 11/26 >>  CBG was 49 mg/dL ~9899. She was given D50 in response and f/up CBG measured at 95 mg/dL  Patient has been intermittently refusing sliding scale insulin   Monitoring patient response closely in setting of removed transplanted pancreas. DM Coordinator added CGM monitor on 11/24.   Electrolytes: Na 133, K 4.2 (goal >/=4), Ch 104, Ca 8.3 [CoCa 9.66], Phos 3.8, Mg 1.8 (goal >/= 2) Renal: SCr  0.91, BUN 28 Hepatic: LFTs/Tbili/TG within normal limits. Albumin  2.3.  Intake / Output; MIVF: UOP 2+ unmeasured. LBM 11/25 [2+ unmeasured] GI Imaging: 11/17 KUB - dilated small bowel loops consistent with obstruction 11/17  CT Abd - distal small bowel obstruction d/t mesenteric volvulus, no free gas, trace ascites 11/21 KUB - pending GI Surgeries / Procedures:  11/17 Exlap resection of distal ileum and ascending colon, explant of transplanted pancreas, and ABThera VAC placement  11/18 abdominal washout, ileocolic anastomosis, and abdominal closure  Central access: 01/26/24 TPN start date: 01/26/24  Nutritional Goals: Goal TPN rate is 65 mL/hr (provides 83 g of protein and 1515 kcals per day)  RD Assessment: Estimated Needs Total Energy Estimated Needs: 1500-1700 kcals Total Protein Estimated Needs: 75-90 g Total Fluid Estimated Needs: 1.5L  Current Nutrition:  NPO and TPN *Will need pancreatic enzymes once taking enteral diet  Plan:  Continue TPN  65 mL/hr at 1800- providing ~90 grams of protein and 1502 Kcal meeting 100% of needs  TPN provides ~174.72 g dextrose  Electrolytes in TPN: Na 60 mEq/L, K 50 mEq/L, Ca 5 mEq/L, Mg 10 mEq/L, and Phos 20 mmol/L. Cl:Ac 1:1 Add standard MVI and trace elements to TPN Add Thiamine 100 mg to TPN - through 02/02/24. Continue very Sensitive q4h SSI and basal insulin  with Semglee  6 units daily per MD and DM Coordinator.  Reduce TPN regular insulin  to 7 units - DM Coordinator and MD aware Mag 2 gm iv x1 Monitor TPN labs on Mon/Thurs, prn    Britley Gashi BS, PharmD, BCPS Clinical Pharmacist 01/31/2024 6:58 AM  Contact: [phone] after 3 PM

## 2024-01-31 NOTE — Progress Notes (Addendum)
 Progress Note  8 Days Post-Op  Subjective: Pt reports she is tolerating CLD without worsening nausea. Having explosive diarrhea which she thinks may be related to lipids in TPN. Stools are greasy. Would like to try some yogurt today. Minimal abdominal pain.   Objective: Vital signs in last 24 hours: Temp:  [97.7 F (36.5 C)-98.2 F (36.8 C)] 98 F (36.7 C) (11/26 1124) Pulse Rate:  [60-87] 85 (11/26 1124) Resp:  [19-22] 20 (11/26 1124) BP: (103-133)/(46-67) 120/61 (11/26 1124) SpO2:  [89 %-100 %] 100 % (11/26 1124) Last BM Date : 01/30/24  Intake/Output from previous day: 11/25 0701 - 11/26 0700 In: 1903.4 [I.V.:1716.7; IV Piggyback:186.7] Out: -  Intake/Output this shift: No intake/output data recorded.  PE: General: resting in bed, NAD Heart: RRR Lungs: nonlabored respirations Abd: soft, nondistended. Midline wound is open at the skin, wound bed is clean and dry, fascia in tact.   Lab Results:  Recent Labs    01/30/24 0330 01/31/24 0442  WBC 16.9* 21.0*  HGB 9.6* 8.1*  HCT 29.7* 24.7*  PLT 208 220   BMET Recent Labs    01/30/24 0330 01/31/24 0442  NA 134* 133*  K 5.1 4.2  CL 103 104  CO2 21* 21*  GLUCOSE 341* 122*  BUN 28* 28*  CREATININE 0.91 0.84  CALCIUM  8.7* 8.3*   PT/INR No results for input(s): LABPROT, INR in the last 72 hours. CMP     Component Value Date/Time   NA 133 (L) 01/31/2024 0442   K 4.2 01/31/2024 0442   CL 104 01/31/2024 0442   CO2 21 (L) 01/31/2024 0442   GLUCOSE 122 (H) 01/31/2024 0442   BUN 28 (H) 01/31/2024 0442   CREATININE 0.84 01/31/2024 0442   CALCIUM  8.3 (L) 01/31/2024 0442   PROT 4.9 (L) 01/29/2024 0510   ALBUMIN  2.3 (L) 01/29/2024 0510   AST 27 01/29/2024 0510   ALT 32 01/29/2024 0510   ALKPHOS 53 01/29/2024 0510   BILITOT 0.7 01/29/2024 0510   GFRNONAA >60 01/31/2024 0442   Lipase  No results found for: LIPASE     Studies/Results: No results found.  Anti-infectives: Anti-infectives (From  admission, onward)    Start     Dose/Rate Route Frequency Ordered Stop   01/24/24 1400  piperacillin -tazobactam (ZOSYN ) IVPB 3.375 g  Status:  Discontinued        3.375 g 12.5 mL/hr over 240 Minutes Intravenous Every 8 hours 01/24/24 0957 01/30/24 0740   01/22/24 1200  piperacillin -tazobactam (ZOSYN ) IVPB 2.25 g  Status:  Discontinued        2.25 g 100 mL/hr over 30 Minutes Intravenous Every 6 hours 01/22/24 9386 01/24/24 0957   01/21/24 2330  ceFEPIme  (MAXIPIME ) 2 g in sodium chloride  0.9 % 100 mL IVPB        2 g 200 mL/hr over 30 Minutes Intravenous  Once 01/21/24 2316 01/21/24 2356   01/21/24 2330  metroNIDAZOLE  (FLAGYL ) IVPB 500 mg        500 mg 100 mL/hr over 60 Minutes Intravenous  Once 01/21/24 2316 01/22/24 0109        Assessment/Plan  Hx of kidney-pancreas transplant 1999, followed at [facility]  Internal hernia with small bowel volvulus and necrosis of terminal ileum and ascending colon POD9 ex-lap, resection of distal ileum and ascending colon, explant of transplanted pancreas, ABThera VAC placement Dr. Dasie  POD8 ex-lap, abdominal washout, ileocolic anastomosis, abdominal closure Dr. Aron - Advance to FLD - Continue TPN - Has completed 7 days  abx postop. WBC elevated to 20K from 16K yesterday but no other signs of infection, may be related to steroids. Continue to monitor, if going up still tomorrow then likely CT - BID WTD to midline wound - T1DM s/p explant of transplanted pancreas: blood sugars have been labile, on low dose Semglee  and sliding scale. CGM placed yesterday. - add fiber supplement for diarrhea - will consult dietician tomorrow if calorie count to be initiated   FEN: FLD, TPN (wean tomorrow if tolerating FLD), add fiber supplement today  VTE: SQH ID: [email or web address]  11/17>11/25   - per [facility] -  HTN HLD Asthma Hx of CVA    LOS: 9 days    Burnard JONELLE Louder, [facility] 01/31/2024, 12:07 PM Please see Amion for pager number during day hours  7:00am-4:30pm

## 2024-01-31 NOTE — Assessment & Plan Note (Addendum)
 Hyponatremia, hypo and hyperkalemia, hypomagnesemia   Renal function today with serum cr at 0,84 with K at 4,3 and serum bicarbonate at 23 Na 129 with glucose 353, (pseudo hyponatremia)  Add 4 g mg sulfate.   Follow up renal function and electrolytes in am.

## 2024-01-31 NOTE — Progress Notes (Signed)
 Mobility Specialist Progress Note;   01/31/24 1227  Mobility  Activity Ambulated with assistance;Pivoted/transferred from bed to chair  Level of Assistance Contact guard assist, steadying assist  Assistive Device Front wheel walker  Distance Ambulated (ft) 150 ft  Activity Response Tolerated well  Mobility Referral Yes  Mobility visit 1 Mobility  Mobility Specialist Start Time (ACUTE ONLY) 1227  Mobility Specialist Stop Time (ACUTE ONLY) 1245  Mobility Specialist Time Calculation (min) (ACUTE ONLY) 18 min   Pt eager for mobility. Required MinG assistance during ambulation for safety. VSS throughout. Ambulated 158ft then took 1x seated rest break, able to ambulate back to room. No c/o when asked. Left pt in chair to eat lunch. Pt left with all needs met, alarm on.   Lauraine Erm Mobility Specialist Please contact via SecureChat or [employer] [phone]

## 2024-01-31 NOTE — Inpatient Diabetes Management (Signed)
 Inpatient Diabetes Program Recommendations  AACE/ADA: New Consensus Statement on Inpatient Glycemic Control (2015)  Target Ranges:  Prepandial:   less than 140 mg/dL      Peak postprandial:   less than 180 mg/dL (1-2 hours)      Critically ill patients:  140 - 180 mg/dL   Lab Results  Component Value Date   GLUCAP 123 (H) 01/31/2024   HGBA1C 5.0 01/22/2024    Review of Glycemic Control  Latest Reference Range & Units 01/30/24 20:02 01/31/24 00:04 01/31/24 01:09 01/31/24 01:27 01/31/24 04:18 01/31/24 07:34  Glucose-Capillary 70 - 99 mg/dL 838 (H) 64 (L) 49 (L) 95 108 (H) 123 (H)   Diabetes history: DM 1 (transplanted pancreas removed) Outpatient Diabetes medications:  None Current orders for Inpatient glycemic control:  Novolog  0-6 units q 4 hours Semglee  6 units daily TPN-(8 units/liter)  Inpatient Diabetes Program Recommendations:    CBG's improved.  Mild lows overnight.  Per pharmacist, plans to reduce insulin  in TPN to 7 units/liter.  Will follow. Patient is wearing CGM as well.   Thanks,  Randall Bullocks, RN, BC-ADM Inpatient Diabetes Coordinator Pager [phone]  (8a-5p)

## 2024-01-31 NOTE — Assessment & Plan Note (Addendum)
 Will increased basal insulin  8 units and sliding scale.  Fasting glucose this am 246 mg/dl   Sp simultaneous renal and pancreas transplant in 1999  Continue tacrolimus , mycophenolate  and prednisone   Added pancreatic enzymes.

## 2024-01-31 NOTE — Progress Notes (Signed)
  Progress Note   Patient: Sherri Todd FMW:[id number] DOB: 04/08/1960 DOA: 01/21/2024     9 DOS: the patient was seen and examined on 01/31/2024   Brief hospital course: Sherri Todd was admitted to the hospital with the working diagnosis of peritonitis and mesenteric volvulus.   11/17 presented abdominal pain nausea hypotension pressors needed AKI creatinine 1.4-->2.2 emergent laparotomy for small bowel obstruction secondary to mesenteric volvulus 1118 return 2 OR anastomosis closure 11/19 extubated 11/21 PICC line placed TPN started  Assessment and Plan: * Small bowel ischemia Peritonitis with necrosis and mesenteric volvulus.  Septic shock has resolved. (Present on admission) Initial surgery on 11.17 for internal hernia with small bowel volvulus and necrosis of the terminal ileum and ascending colon.  11/18 Reopening of prior laparotomy, ileocolonic anastamosis at proximal transverse colon, suture repair upon closure of umbilical hernia   Post operative ileus.  Currently on TPN for nutritional support.   Today tolerating clear liquids.  Plan to check C diff and CT abdomen and pelvis due to diarrhea.   AKI (acute kidney injury) Hyponatremia, hypo and hyperkalemia   Renal function with serum cr at 0,84 with K at 4.2 and serum bicarbonate at 21 Na 133 and Mg 1,8   Plan to continue TPN and follow up renal function and electrolytes in am.   Type 1 diabetes mellitus (HCC) Continue insulin  basal 6 units and sliding scale.  Fasting glucose this am 122 mg/dl   Sp simultaneous renal and pancreas transplant in 1999  Continue tacrolimus , mycophenolate  and prednisone    Essential hypertension Continue blood pressure monitoring  History of CVA (cerebrovascular accident) Continue blood pressure monitoring   Malnutrition of moderate degree Continue nutritional support with TPN   Depression Positive anxiety  Plan to continue fluoxetine    Positive confusion, not quite  encephalopathy, patient has high risk for delirium.  Continue supportive therapy         Subjective: Patient with no chest pain or dyspnea, she has been confused and anxious, her husband at the bedside. Positive fecal incontinence and positive flatus   Physical Exam: Vitals:   01/30/24 1934 01/30/24 2332 01/31/24 0440 01/31/24 0735  BP: (!) 103/51 (!) 111/46  127/60  Pulse: 60 87  87  Resp: (!) 22   (!) 22  Temp: 98.2 F (36.8 C) 98.2 F (36.8 C) 98.2 F (36.8 C) 98 F (36.7 C)  TempSrc: Oral Oral Oral Oral  SpO2: 95% 95%  (!) 89%  Weight:      Height:       Neurology awake and alert ENT with mild pallor with no icterus Cardiovascular with S1 and S2 present and regular with no gallops, or rubs. Respiratory with no rales or wheezing, no rhonchi  Abdomen with soft and not distended, mild tender to palpation, surgical wound with dressing in place No lower extremity edema   Data Reviewed:    Family Communication: I spoke with patient's husband at the bedside, we talked in detail about patient's condition, plan of care and prognosis and all questions were addressed.   Disposition: Status is: Inpatient Remains inpatient appropriate because: pos op recovery   Planned Discharge Destination: Home     Author: Elidia Toribio Furnace, MD 01/31/2024 11:11 AM  For on call review [email or web address].

## 2024-01-31 NOTE — Progress Notes (Signed)
 The patient with ongoing confusion according to day shift RN. She's calling everyone in her contacts complaining about a female RN they have assigned to her and endorsed not feeling safe here. Patient's husband just called to see if there is anything we could give her to calm down. Dr. Franky notified without any new order will continue to monitor.

## 2024-01-31 NOTE — Assessment & Plan Note (Addendum)
 Continue blood pressure monitoring  Continue with aspirin and statin

## 2024-01-31 NOTE — Progress Notes (Addendum)
 Patient ID: Sherri Todd, female   DOB: [birth year], 63 y.o.   MRN: [id number] [facility] Progress Note   Assessment/ Plan:   1. Acute kidney Injury: Currently nonoliguric after recovery from initial oliguric acute kidney injury in the setting of shock/mild ATN.  Fair urine output overnight with stable renal function.  Potassium dose adjusted in TPN.   2.  History of simultaneous kidney/pancreas transplantation in 1999: Previously on MMF/tacrolimus  and prednisone  with the former held at this time.  Transitioned to sublingual tacrolimus  while NPO and IV Solu-Medrol  instead of prednisone ; will switch to oral tacrolimus  and prednisone .  Restart MMF today. 3.  Ischemic bowel: Postoperative day #8 after exploratory laparotomy and resection of distal ileum/ascending colon and explant of transplanted pancreas for the management of internal hernia/small bowel volvulus with necrosis of terminal ileum and ascending colon.  Now tolerating oral intake following removal of NG tube yesterday. 4.  Septic shock: Appears to have improved/resolved status post management of ischemic bowel with surgical intervention.  Previously on Zosyn  that was discontinued after completion of 7 days of antibiotics.  Subjective:   Denies any problems overnight and tolerated oral intake without nausea/vomiting or abdominal pain following removal of NG tube.  Objective:   BP 127/60 (BP Location: Left Arm)   Pulse 87   Temp 98 F (36.7 C) (Oral)   Resp (!) 22   Ht 5' 1 (1.549 m)   Wt 45.3 kg   SpO2 (!) 89%   BMI 18.87 kg/m   Intake/Output Summary (Last 24 hours) at 01/31/2024 9095 Last data filed at 01/31/2024 0421 Gross per 24 hour  Intake 1903.42 ml  Output --  Net 1903.42 ml   Weight change:   Physical Exam: Gen: Slim woman, comfortably sitting in recliner, empty breakfast tray (clear liquids) CVS: Pulse regular rhythm, normal rate, S1 and S2 normal Resp: Diminished breath sounds over bases,  poor inspiratory effort, no rales/rhonchi Abd: Intact abdominal midline dressing, bowel sounds scant Ext: No lower extremity edema  Imaging: No results found.  Labs: BMET Recent Labs  Lab 01/26/24 0334 01/27/24 0545 01/27/24 1517 01/27/24 1917 01/28/24 0223 01/28/24 1755 01/29/24 0510 01/30/24 0330 01/31/24 0442  NA 140 143  --  141 142 139 136 134* 133*  K 2.7* 2.6* 4.1 3.5 3.4* 5.4* 5.0 5.1 4.2  CL 102 101  --  104 105 105 102 103 104  CO2 27 31  --  31 27 21* 24 21* 21*  GLUCOSE 314* 295*  --  75 116* 261* 300* 341* 122*  BUN 47* 28*  --  23 23 26* 25* 28* 28*  CREATININE 1.25* 0.98  --  0.82 0.83 0.97 0.90 0.91 0.84  CALCIUM  8.6* 8.5*  --  8.6* 8.4* 8.4* 8.3* 8.7* 8.3*  PHOS 3.3 2.1*  --  2.7 2.9 3.9 3.6 3.8  --    CBC Recent Labs  Lab 01/27/24 0545 01/29/24 0510 01/30/24 0330 01/31/24 0442  WBC 12.8* 17.4* 16.9* 21.0*  NEUTROABS  --  14.2* 14.1* 17.4*  HGB 9.0* 9.1* 9.6* 8.1*  HCT 26.6* 28.1* 29.7* 24.7*  MCV 99.6 105.6* 106.8* 104.7*  PLT 109* 170 208 220    Medications:     Chlorhexidine  Gluconate Cloth  6 each Topical Daily   FLUoxetine   20 mg Oral Daily   heparin   5,000 Units Subcutaneous Q8H   insulin  aspart  0-6 Units Subcutaneous Q4H   insulin  glargine-yfgn  6 Units Subcutaneous Daily   methylPREDNISolone  (SOLU-MEDROL ) injection  10 mg Intravenous Daily   pantoprazole  (PROTONIX ) IV  40 mg Intravenous Daily   sodium chloride  flush  10-40 mL Intracatheter Q12H   tacrolimus   0.5 mg Sublingual BID   zolpidem   5 mg Oral QHS    Gordy Blanch, MD 01/31/2024, 9:04 AM

## 2024-01-31 NOTE — Plan of Care (Signed)
  Problem: Education: Goal: Knowledge of General Education information will improve Description: Including pain rating scale, medication(s)/side effects and non-pharmacologic comfort measures Outcome: Progressing   Problem: Health Behavior/Discharge Planning: Goal: Ability to manage health-related needs will improve Outcome: Progressing   Problem: Clinical Measurements: Goal: Ability to maintain clinical measurements within normal limits will improve Outcome: Progressing Goal: Will remain free from infection Outcome: Progressing Goal: Diagnostic test results will improve Outcome: Progressing Goal: Respiratory complications will improve Outcome: Progressing Goal: Cardiovascular complication will be avoided Outcome: Progressing   Problem: Activity: Goal: Risk for activity intolerance will decrease Outcome: Progressing   Problem: Nutrition: Goal: Adequate nutrition will be maintained Outcome: Progressing   Problem: Coping: Goal: Level of anxiety will decrease Outcome: Progressing   Problem: Elimination: Goal: Will not experience complications related to bowel motility Outcome: Progressing Goal: Will not experience complications related to urinary retention Outcome: Progressing   Problem: Pain Managment: Goal: General experience of comfort will improve and/or be controlled Outcome: Progressing   Problem: Safety: Goal: Ability to remain free from injury will improve Outcome: Progressing   Problem: Skin Integrity: Goal: Risk for impaired skin integrity will decrease Outcome: Progressing   Problem: Education: Goal: Ability to describe self-care measures that may prevent or decrease complications (Diabetes Survival Skills Education) will improve Outcome: Progressing Goal: Individualized Educational Video(s) Outcome: Progressing   Problem: Coping: Goal: Ability to adjust to condition or change in health will improve Outcome: Progressing   Problem: Fluid  Volume: Goal: Ability to maintain a balanced intake and output will improve Outcome: Progressing   Problem: Health Behavior/Discharge Planning: Goal: Ability to identify and utilize available resources and services will improve Outcome: Progressing Goal: Ability to manage health-related needs will improve Outcome: Progressing   Problem: Metabolic: Goal: Ability to maintain appropriate glucose levels will improve Outcome: Progressing   Problem: Nutritional: Goal: Maintenance of adequate nutrition will improve Outcome: Progressing Goal: Progress toward achieving an optimal weight will improve Outcome: Progressing   Problem: Skin Integrity: Goal: Risk for impaired skin integrity will decrease Outcome: Progressing   Problem: Tissue Perfusion: Goal: Adequacy of tissue perfusion will improve Outcome: Progressing   Problem: Role Relationship: Goal: Method of communication will improve Outcome: Progressing

## 2024-01-31 NOTE — Assessment & Plan Note (Addendum)
 Continue blood pressure monitoring Continue to hold on diltiazem.

## 2024-01-31 NOTE — Assessment & Plan Note (Addendum)
 Peritonitis with necrosis and mesenteric volvulus.  Septic shock has resolved. (Present on admission) Initial surgery on 11.17 for internal hernia with small bowel volvulus and necrosis of the terminal ileum and ascending colon.  11/18 Reopening of prior laparotomy, ileocolonic anastamosis at proximal transverse colon, suture repair upon closure of umbilical hernia   11/26 CT abdomen and pelvis left lower quadrant renal transplant with mild hydronephrosis. Areas of parenchymal atrophy and cortical scarring predominantly involving the posterior  transplant kidney likely due to areas of infarct.  Small peri transplant fluid collection relatively similar or slightly increased since prior imaging, without significant mass effect.  Postsurgical changes of the bowel with ileocolic anastomosis in the right upper abdomen. No bowel obstruction.   Follow up wbc is down to 17,8  Cultures have been with no growth. Repeat urine analysis with SG 1,010 with negative leukocytes, wbc 6-10   Recovering from post operative ileus.  Currently on TPN for nutritional support.  Tolerating well soft diet.  Out of bed.  Continue pain control with tramadol  as needed.  Continue with pantoprazole  to po.

## 2024-01-31 NOTE — Assessment & Plan Note (Addendum)
 Continue with nutritional supplements.

## 2024-01-31 NOTE — Hospital Course (Addendum)
 Sherri Todd was admitted to the hospital with the working diagnosis of peritonitis and mesenteric volvulus.   63 yo female with the past medical history of kidney and pancreas transplantation in 1999, T1DM, hypertension and dyslipidemia who presented with abdominal pain, nausea and vomiting. She had worsening symptoms for the last 24 hrs prior to admission, because of severe pain she called EMS. She was found hypotensive, with systolic blood pressure in the 70's, she had 250 ml LR IV bolus and was transported to the ED. On the initial physical examination she continue to be hypotensive despite IV fluids, and was placed on vasopressors.  On admission blood pressure 102/53, HR 117, RR 26 and 02 saturation 97%  Lungs with no wheezing or rhonchi, heart with S1 and S2 present and tachycardic, abdomen with tenderness to palpation, distended but compressible, well healed lower surgical scar.   Na 135, K 3,8 Cl 100 bicarbonate 20, glucose 122, bun 31 cr 2,2 AST 36 ALT 15  Lactic acid 4.1 and 4.6  Wbc 16,3 hgb 13,8 plt 214  Urine analysis SG >1.030, protein > 300, small leukocytes, negative hgb. 21-50 rbc, 21-50 wbc   Chest radiograph with hypoinflation with no effusions or infiltrates, no effusions   EKG 106 bpm, normal axis, normal intervals qtc 476, sinus rhythm with no significant ST segment or T wave changes.   CT abdomen and pelvis with distal small bowel obstruction due to mesenteric volvulus. No free intraperitoneal gas. Trace ascites.  Transplant kidney in the left iliac fossa with small perinephric fluid collection, no significant mass effect of the transplanted kidney.   Patient underwent emergent laparotomy for small bowel obstruction secondary to mesenteric volvulus 1118 return 2 OR anastomosis closure 11/19 extubated 11/21 PICC line placed TPN started, transferred to [facility].  11/26 repeat CT abdomen and pelvis with no intra abdominal abscess. Positive small fluid around the transplanted  kidney, mild hydronephrosis.  11/29 tolerating po well, starting to wean off TPN, reduced by 50% today.  11/30 stop TPN.  12/01 patient tolerating po well, will plan to monitor glucose today on insulin  regimen, if stable will plan for discharge home tomorrow. She is in risk of DKA and hypoglycemia.  12/02 glucose has been stable, plan to continue sq insulin  at home and have close follow up as outpatient.

## 2024-02-01 DIAGNOSIS — E1069 Type 1 diabetes mellitus with other specified complication: Secondary | ICD-10-CM | POA: Diagnosis not present

## 2024-02-01 DIAGNOSIS — K559 Vascular disorder of intestine, unspecified: Secondary | ICD-10-CM | POA: Diagnosis not present

## 2024-02-01 DIAGNOSIS — N179 Acute kidney failure, unspecified: Secondary | ICD-10-CM | POA: Diagnosis not present

## 2024-02-01 DIAGNOSIS — I10 Essential (primary) hypertension: Secondary | ICD-10-CM | POA: Diagnosis not present

## 2024-02-01 LAB — COMPREHENSIVE METABOLIC PANEL WITH GFR
ALT: 25 U/L (ref 0–44)
AST: 19 U/L (ref 15–41)
Albumin: 2.1 g/dL — ABNORMAL LOW (ref 3.5–5.0)
Alkaline Phosphatase: 55 U/L (ref 38–126)
Anion gap: 8 (ref 5–15)
BUN: 25 mg/dL — ABNORMAL HIGH (ref 8–23)
CO2: 21 mmol/L — ABNORMAL LOW (ref 22–32)
Calcium: 8.3 mg/dL — ABNORMAL LOW (ref 8.9–10.3)
Chloride: 106 mmol/L (ref 98–111)
Creatinine, Ser: 0.75 mg/dL (ref 0.44–1.00)
GFR, Estimated: >60 mL/min (ref >60)
Glucose, Bld: 168 mg/dL — ABNORMAL HIGH (ref 70–99)
Potassium: 4.4 mmol/L (ref 3.5–5.1)
Sodium: 135 mmol/L (ref 135–145)
Total Bilirubin: 0.3 mg/dL (ref 0.0–1.2)
Total Protein: 4.8 g/dL — ABNORMAL LOW (ref 6.5–8.1)

## 2024-02-01 LAB — CBC WITH DIFFERENTIAL/PLATELET
Abs Immature Granulocytes: 0.23 K/uL — ABNORMAL HIGH (ref 0.00–0.07)
Basophils Absolute: 0 K/uL (ref 0.0–0.1)
Basophils Relative: 0 %
Eosinophils Absolute: 0.2 K/uL (ref 0.0–0.5)
Eosinophils Relative: 1 %
HCT: 24.9 % — ABNORMAL LOW (ref 36.0–46.0)
Hemoglobin: 8 g/dL — ABNORMAL LOW (ref 12.0–15.0)
Immature Granulocytes: 1 %
Lymphocytes Relative: 5 %
Lymphs Abs: 1 K/uL (ref 0.7–4.0)
MCH: 34 pg (ref 26.0–34.0)
MCHC: 32.1 g/dL (ref 30.0–36.0)
MCV: 106 fL — ABNORMAL HIGH (ref 80.0–100.0)
Monocytes Absolute: 1.5 K/uL — ABNORMAL HIGH (ref 0.1–1.0)
Monocytes Relative: 9 %
Neutro Abs: 15 K/uL — ABNORMAL HIGH (ref 1.7–7.7)
Neutrophils Relative %: 84 %
Platelets: 247 K/uL (ref 150–400)
RBC: 2.35 MIL/uL — ABNORMAL LOW (ref 3.87–5.11)
RDW: 16.6 % — ABNORMAL HIGH (ref 11.5–15.5)
WBC: 18 K/uL — ABNORMAL HIGH (ref 4.0–10.5)
nRBC: 0 % (ref 0.0–0.2)

## 2024-02-01 LAB — URINALYSIS, ROUTINE W REFLEX MICROSCOPIC
Bacteria, UA: NONE SEEN
Bilirubin Urine: NEGATIVE
Glucose, UA: NEGATIVE mg/dL
Ketones, ur: NEGATIVE mg/dL
Leukocytes,Ua: NEGATIVE
Nitrite: NEGATIVE
Protein, ur: NEGATIVE mg/dL
Specific Gravity, Urine: 1.01 (ref 1.005–1.030)
pH: 5 (ref 5.0–8.0)

## 2024-02-01 LAB — GLUCOSE, CAPILLARY
Glucose-Capillary: 178 mg/dL — ABNORMAL HIGH (ref 70–99)
Glucose-Capillary: 183 mg/dL — ABNORMAL HIGH (ref 70–99)
Glucose-Capillary: 198 mg/dL — ABNORMAL HIGH (ref 70–99)
Glucose-Capillary: 198 mg/dL — ABNORMAL HIGH (ref 70–99)
Glucose-Capillary: 200 mg/dL — ABNORMAL HIGH (ref 70–99)
Glucose-Capillary: 239 mg/dL — ABNORMAL HIGH (ref 70–99)

## 2024-02-01 LAB — MAGNESIUM: Magnesium: 1.8 mg/dL (ref 1.7–2.4)

## 2024-02-01 LAB — PHOSPHORUS: Phosphorus: 3.9 mg/dL (ref 2.5–4.6)

## 2024-02-01 MED ORDER — ENOXAPARIN SODIUM 30 MG/0.3ML IJ SOSY
30.0000 mg | PREFILLED_SYRINGE | INTRAMUSCULAR | Status: DC
  Administered 2024-02-01 – 2024-02-05 (×5): 30 mg via SUBCUTANEOUS
  Filled 2024-02-01 (×5): qty 0.3

## 2024-02-01 MED ORDER — TRAMADOL HCL 50 MG PO TABS: 50.0000 mg | ORAL_TABLET | Freq: Four times a day (QID) | ORAL | Status: DC | PRN

## 2024-02-01 MED ORDER — ACETAMINOPHEN 325 MG PO TABS: 650.0000 mg | ORAL_TABLET | Freq: Four times a day (QID) | ORAL | Status: DC | PRN

## 2024-02-01 MED ORDER — PANTOPRAZOLE SODIUM 40 MG PO TBEC
40.0000 mg | DELAYED_RELEASE_TABLET | Freq: Every day | ORAL | Status: DC
  Administered 2024-02-02 – 2024-02-06 (×5): 40 mg via ORAL
  Filled 2024-02-01 (×6): qty 1

## 2024-02-01 MED ORDER — HYDROMORPHONE HCL 1 MG/ML IJ SOLN: 0.5000 mg | INTRAMUSCULAR | Status: DC | PRN

## 2024-02-01 MED ORDER — MAGNESIUM OXIDE -MG SUPPLEMENT 400 (240 MG) MG PO TABS
400.0000 mg | ORAL_TABLET | Freq: Two times a day (BID) | ORAL | Status: DC
  Administered 2024-02-01 – 2024-02-06 (×11): 400 mg via ORAL
  Filled 2024-02-01 (×11): qty 1

## 2024-02-01 MED ORDER — TRAVASOL 10 % IV SOLN
INTRAVENOUS | Status: AC
  Filled 2024-02-01: qty 904.8

## 2024-02-01 MED ORDER — MAGNESIUM SULFATE 2 GM/50ML IV SOLN
2.0000 g | Freq: Once | INTRAVENOUS | Status: AC
  Administered 2024-02-01: 2 g via INTRAVENOUS
  Filled 2024-02-01: qty 50

## 2024-02-01 NOTE — Plan of Care (Signed)
  Problem: Clinical Measurements: Goal: Respiratory complications will improve Outcome: Progressing   Problem: Elimination: Goal: Will not experience complications related to urinary retention Outcome: Progressing   Problem: Pain Managment: Goal: General experience of comfort will improve and/or be controlled Outcome: Progressing   Problem: Education: Goal: Knowledge of General Education information will improve Description: Including pain rating scale, medication(s)/side effects and non-pharmacologic comfort measures Outcome: Not Progressing   Problem: Elimination: Goal: Will not experience complications related to bowel motility Outcome: Not Progressing   Problem: Skin Integrity: Goal: Risk for impaired skin integrity will decrease Outcome: Not Progressing

## 2024-02-01 NOTE — Progress Notes (Signed)
 Mobility Specialist Progress Note:    02/01/24 0935  Mobility  Activity Ambulated with assistance  Level of Assistance Contact guard assist, steadying assist  Assistive Device Front wheel walker  Distance Ambulated (ft) 200 ft  Range of Motion/Exercises Active  Activity Response Tolerated fair  Mobility Referral Yes  Mobility visit 1 Mobility  Mobility Specialist Start Time (ACUTE ONLY) 0935  Mobility Specialist Stop Time (ACUTE ONLY) 0950  Mobility Specialist Time Calculation (min) (ACUTE ONLY) 15 min   Received pt laying in bed agreeable to session. No c/o any symptoms but pt very cold and had to take moments to stop and gather her breathe. Focused on PLB. Pt able to move and ambulate decently. Returned pt to bed w/ all needs met.   Venetia Keel Mobility Specialist Please Neurosurgeon or Rehab Office at [phone]

## 2024-02-01 NOTE — Plan of Care (Signed)
  Problem: Education: Goal: Knowledge of General Education information will improve Description: Including pain rating scale, medication(s)/side effects and non-pharmacologic comfort measures Outcome: Progressing   Problem: Health Behavior/Discharge Planning: Goal: Ability to manage health-related needs will improve Outcome: Progressing   Problem: Clinical Measurements: Goal: Ability to maintain clinical measurements within normal limits will improve Outcome: Progressing Goal: Will remain free from infection Outcome: Progressing Goal: Diagnostic test results will improve Outcome: Progressing Goal: Respiratory complications will improve Outcome: Progressing Goal: Cardiovascular complication will be avoided Outcome: Progressing   Problem: Activity: Goal: Risk for activity intolerance will decrease Outcome: Progressing   Problem: Nutrition: Goal: Adequate nutrition will be maintained Outcome: Progressing   Problem: Coping: Goal: Level of anxiety will decrease Outcome: Progressing   Problem: Elimination: Goal: Will not experience complications related to bowel motility Outcome: Progressing Goal: Will not experience complications related to urinary retention Outcome: Progressing   Problem: Pain Managment: Goal: General experience of comfort will improve and/or be controlled Outcome: Progressing   Problem: Safety: Goal: Ability to remain free from injury will improve Outcome: Progressing   Problem: Skin Integrity: Goal: Risk for impaired skin integrity will decrease Outcome: Progressing   Problem: Education: Goal: Ability to describe self-care measures that may prevent or decrease complications (Diabetes Survival Skills Education) will improve Outcome: Progressing Goal: Individualized Educational Video(s) Outcome: Progressing   Problem: Coping: Goal: Ability to adjust to condition or change in health will improve Outcome: Progressing   Problem: Fluid  Volume: Goal: Ability to maintain a balanced intake and output will improve Outcome: Progressing   Problem: Health Behavior/Discharge Planning: Goal: Ability to identify and utilize available resources and services will improve Outcome: Progressing Goal: Ability to manage health-related needs will improve Outcome: Progressing   Problem: Metabolic: Goal: Ability to maintain appropriate glucose levels will improve Outcome: Progressing   Problem: Nutritional: Goal: Maintenance of adequate nutrition will improve Outcome: Progressing Goal: Progress toward achieving an optimal weight will improve Outcome: Progressing   Problem: Skin Integrity: Goal: Risk for impaired skin integrity will decrease Outcome: Progressing   Problem: Tissue Perfusion: Goal: Adequacy of tissue perfusion will improve Outcome: Progressing   Problem: Role Relationship: Goal: Method of communication will improve Outcome: Progressing

## 2024-02-01 NOTE — Progress Notes (Signed)
 Patient ID: Sherri Todd, female   DOB: 06/06/1960, 63 y.o.   MRN: [id number] [facility] Progress Note   Assessment/ Plan:   1. Acute kidney Injury: Renal function back to baseline and she remains nonoliguric after recovery from initial oliguric acute kidney injury in the setting of shock/mild ATN.  No acute electrolyte abnormalities. 2.  History of simultaneous kidney/pancreas transplantation in 1999: Yesterday was restarted back on oral tacrolimus , MMF and prednisone  (after previously holding MMF and conversion to SL tacrolimus  and Solu-Medrol ).  Will check a tacrolimus  trough level today. 3.  Ischemic bowel: Postoperative day #8 after exploratory laparotomy and resection of distal ileum/ascending colon and explant of transplanted pancreas for the management of internal hernia/small bowel volvulus with necrosis of terminal ileum and ascending colon.  Now tolerating oral intake following removal of NG tube yesterday. 4.  Septic shock: Appears to have improved/resolved status post management of ischemic bowel with surgical intervention.  Previously on Zosyn  that was discontinued after completion of 7 days of antibiotics. 5.  Encephalopathy: Intermittent confusion/delusions noted overnight.  CT scan of the abdomen/pelvis without any focal infection and I will check a urinalysis/urine culture today to confirm that this is not allograft pyelonephritis.  She remains afebrile and with improvement of leukocytosis (which may be falsely improving because of MMF).  Subjective:   Some problems with confusion/encephalopathy overnight.  Objective:   BP 134/65 (BP Location: Left Arm)   Pulse 81   Temp 98 F (36.7 C) (Oral)   Resp 18   Ht 5' 1 (1.549 m)   Wt 45.3 kg   SpO2 98%   BMI 18.87 kg/m   Intake/Output Summary (Last 24 hours) at 02/01/2024 9146 Last data filed at 02/01/2024 0700 Gross per 24 hour  Intake 2635.71 ml  Output --  Net 2635.71 ml   Weight change:    Physical Exam: Gen: Resting comfortably in bed, husband at bedside CVS: Pulse regular rhythm, normal rate, S1 and S2 normal Resp: Diminished breath sounds over bases, poor inspiratory effort, no rales/rhonchi Abd: Intact abdominal midline dressing, bowel sounds scant Ext: No lower extremity edema  Imaging: CT ABDOMEN PELVIS W CONTRAST Result Date: 01/31/2024 CLINICAL DATA:  Evaluate for abscess. EXAM: CT ABDOMEN AND PELVIS WITH CONTRAST TECHNIQUE: Multidetector CT imaging of the abdomen and pelvis was performed using the standard protocol following bolus administration of intravenous contrast. RADIATION DOSE REDUCTION: This exam was performed according to the departmental dose-optimization program which includes automated exposure control, adjustment of the mA and/or kV according to patient size and/or use of iterative reconstruction technique. CONTRAST:  75mL OMNIPAQUE  IOHEXOL  350 MG/ML SOLN COMPARISON:  CT abdomen pelvis dated 01/22/2024. FINDINGS: Lower chest: The visualized lung bases are clear. No intra-abdominal free air. There is diffuse mesenteric edema and trace ascites. Hepatobiliary: The liver is unremarkable. No biliary dilatation. The gallbladder is contracted. No calcified gallstone. Pancreas: Unremarkable. No pancreatic ductal dilatation or surrounding inflammatory changes. Spleen: Normal in size without focal abnormality. Adrenals/Urinary Tract: The native kidneys are atrophic. There is a left lower quadrant renal transplant. There is mild hydronephrosis of the transplant kidney. Areas of parenchyma atrophy and cortical scarring predominantly involving the posterior transplant kidney likely due to areas of infarct. Clinical correlation recommended to evaluate for possibility of superimposed infection. No stone. There is a small peritransplant fluid collection relatively similar or slightly increased since the prior CT without significant mass effect. A small cyst in the inferior pole of  the transplant kidney. The urinary bladder is  grossly other. Stomach/Bowel: There is postsurgical changes of the bowel with ileocolic anastomosis in the right upper abdomen. There is sigmoid diverticulosis. No bowel obstruction. Appendectomy. Vascular/Lymphatic: Mild aortoiliac atherosclerotic disease. The IVC is unremarkable. No portal venous gas. No adenopathy. Reproductive: Hysterectomy. Other: Midline vertical anterior pelvic wall incisional scar and anterior abdominal wall open wound. Musculoskeletal: No acute osseous pathology. IMPRESSION: 1. Left lower quadrant renal transplant with mild hydronephrosis. Areas of parenchyma atrophy and cortical scarring predominantly involving the posterior transplant kidney likely due to areas of infarct. Clinical correlation recommended to evaluate for possibility of superimposed infection. 2. Small peritransplant fluid collection relatively similar or slightly increased since the prior CT without significant mass effect. 3. Postsurgical changes of the bowel with ileocolic anastomosis in the right upper abdomen. No bowel obstruction. 4. Sigmoid diverticulosis. 5.  Aortic Atherosclerosis (ICD10-I70.0). Electronically Signed   By: Vanetta Chou M.D.   On: 01/31/2024 19:34    Labs: BMET Recent Labs  Lab 01/27/24 0545 01/27/24 1517 01/27/24 1917 01/28/24 0223 01/28/24 1755 01/29/24 0510 01/30/24 0330 01/31/24 0442 02/01/24 0624  NA 143  --  141 142 139 136 134* 133* 135  K 2.6*   < > 3.5 3.4* 5.4* 5.0 5.1 4.2 4.4  CL 101  --  104 105 105 102 103 104 106  CO2 31  --  31 27 21* 24 21* 21* 21*  GLUCOSE 295*  --  75 116* 261* 300* 341* 122* 168*  BUN 28*  --  23 23 26* 25* 28* 28* 25*  CREATININE 0.98  --  0.82 0.83 0.97 0.90 0.91 0.84 0.75  CALCIUM  8.5*  --  8.6* 8.4* 8.4* 8.3* 8.7* 8.3* 8.3*  PHOS 2.1*  --  2.7 2.9 3.9 3.6 3.8  --  3.9   < > = values in this interval not displayed.   CBC Recent Labs  Lab 01/29/24 0510 01/30/24 0330 01/31/24 0442  02/01/24 0624  WBC 17.4* 16.9* 21.0* 18.0*  NEUTROABS 14.2* 14.1* 17.4* 15.0*  HGB 9.1* 9.6* 8.1* 8.0*  HCT 28.1* 29.7* 24.7* 24.9*  MCV 105.6* 106.8* 104.7* 106.0*  PLT 170 208 220 247    Medications:     Chlorhexidine  Gluconate Cloth  6 each Topical Daily   feeding supplement ([employer] STANDARD ENT 1.4)  325 mL Oral BID BM   FLUoxetine   20 mg Oral Daily   heparin   5,000 Units Subcutaneous Q8H   insulin  aspart  0-6 Units Subcutaneous Q4H   insulin  glargine-yfgn  6 Units Subcutaneous Daily   mycophenolate   500 mg Oral BID   pantoprazole  (PROTONIX ) IV  40 mg Intravenous Daily   polycarbophil  625 mg Oral Daily   predniSONE   5 mg Oral Q breakfast   sodium chloride  flush  10-40 mL Intracatheter Q12H   tacrolimus   1 mg Oral BID   zolpidem   5 mg Oral QHS    Gordy Blanch, MD 02/01/2024, 8:53 AM

## 2024-02-01 NOTE — Progress Notes (Signed)
 Progress Note  9 Days Post-Op  Subjective: Some worsening of confusion this morning.  Denies pain.  Objective: Vital signs in last 24 hours: Temp:  [97.8 F (36.6 C)-98.3 F (36.8 C)] 98 F (36.7 C) (11/27 0809) Pulse Rate:  [81-94] 81 (11/27 0809) Resp:  [18-27] 18 (11/27 0809) BP: (120-159)/(57-66) 134/65 (11/27 0809) SpO2:  [94 %-100 %] 98 % (11/27 0809) Last BM Date : 01/31/24  Intake/Output from previous day: 11/26 0701 - 11/27 0700 In: 2635.7 [I.V.:1688.6; IV Piggyback:947.1] Out: -  Intake/Output this shift: No intake/output data recorded.  PE: General: resting in bed, NAD Heart: RRR Lungs: nonlabored respirations Abd: soft, nondistended. Midline wound is open at the skin, wound bed is clean and dry, fascia intact.  Surrounding skin maceration from damp dressings   Lab Results:  Recent Labs    01/31/24 0442 02/01/24 0624  WBC 21.0* 18.0*  HGB 8.1* 8.0*  HCT 24.7* 24.9*  PLT 220 247   BMET Recent Labs    01/31/24 0442 02/01/24 0624  NA 133* 135  K 4.2 4.4  CL 104 106  CO2 21* 21*  GLUCOSE 122* 168*  BUN 28* 25*  CREATININE 0.84 0.75  CALCIUM  8.3* 8.3*   PT/INR No results for input(s): LABPROT, INR in the last 72 hours. CMP     Component Value Date/Time   NA 135 02/01/2024 0624   K 4.4 02/01/2024 0624   CL 106 02/01/2024 0624   CO2 21 (L) 02/01/2024 0624   GLUCOSE 168 (H) 02/01/2024 0624   BUN 25 (H) 02/01/2024 0624   CREATININE 0.75 02/01/2024 0624   CALCIUM  8.3 (L) 02/01/2024 0624   PROT 4.8 (L) 02/01/2024 0624   ALBUMIN  2.1 (L) 02/01/2024 0624   AST 19 02/01/2024 0624   ALT 25 02/01/2024 0624   ALKPHOS 55 02/01/2024 0624   BILITOT 0.3 02/01/2024 0624   GFRNONAA >60 02/01/2024 0624   Lipase  No results found for: LIPASE     Studies/Results: CT ABDOMEN PELVIS W CONTRAST Result Date: 01/31/2024 CLINICAL DATA:  Evaluate for abscess. EXAM: CT ABDOMEN AND PELVIS WITH CONTRAST TECHNIQUE: Multidetector CT imaging of the  abdomen and pelvis was performed using the standard protocol following bolus administration of intravenous contrast. RADIATION DOSE REDUCTION: This exam was performed according to the departmental dose-optimization program which includes automated exposure control, adjustment of the mA and/or kV according to patient size and/or use of iterative reconstruction technique. CONTRAST:  75mL OMNIPAQUE  IOHEXOL  350 MG/ML SOLN COMPARISON:  CT abdomen pelvis dated 01/22/2024. FINDINGS: Lower chest: The visualized lung bases are clear. No intra-abdominal free air. There is diffuse mesenteric edema and trace ascites. Hepatobiliary: The liver is unremarkable. No biliary dilatation. The gallbladder is contracted. No calcified gallstone. Pancreas: Unremarkable. No pancreatic ductal dilatation or surrounding inflammatory changes. Spleen: Normal in size without focal abnormality. Adrenals/Urinary Tract: The native kidneys are atrophic. There is a left lower quadrant renal transplant. There is mild hydronephrosis of the transplant kidney. Areas of parenchyma atrophy and cortical scarring predominantly involving the posterior transplant kidney likely due to areas of infarct. Clinical correlation recommended to evaluate for possibility of superimposed infection. No stone. There is a small peritransplant fluid collection relatively similar or slightly increased since the prior CT without significant mass effect. A small cyst in the inferior pole of the transplant kidney. The urinary bladder is grossly other. Stomach/Bowel: There is postsurgical changes of the bowel with ileocolic anastomosis in the right upper abdomen. There is sigmoid diverticulosis. No bowel obstruction.  Appendectomy. Vascular/Lymphatic: Mild aortoiliac atherosclerotic disease. The IVC is unremarkable. No portal venous gas. No adenopathy. Reproductive: Hysterectomy. Other: Midline vertical anterior pelvic wall incisional scar and anterior abdominal wall open wound.  Musculoskeletal: No acute osseous pathology. IMPRESSION: 1. Left lower quadrant renal transplant with mild hydronephrosis. Areas of parenchyma atrophy and cortical scarring predominantly involving the posterior transplant kidney likely due to areas of infarct. Clinical correlation recommended to evaluate for possibility of superimposed infection. 2. Small peritransplant fluid collection relatively similar or slightly increased since the prior CT without significant mass effect. 3. Postsurgical changes of the bowel with ileocolic anastomosis in the right upper abdomen. No bowel obstruction. 4. Sigmoid diverticulosis. 5.  Aortic Atherosclerosis (ICD10-I70.0). Electronically Signed   By: Vanetta Chou M.D.   On: 01/31/2024 19:34    Anti-infectives: Anti-infectives (From admission, onward)    Start     Dose/Rate Route Frequency Ordered Stop   01/24/24 1400  piperacillin -tazobactam (ZOSYN ) IVPB 3.375 g  Status:  Discontinued        3.375 g 12.5 mL/hr over 240 Minutes Intravenous Every 8 hours 01/24/24 0957 01/30/24 0740   01/22/24 1200  piperacillin -tazobactam (ZOSYN ) IVPB 2.25 g  Status:  Discontinued        2.25 g 100 mL/hr over 30 Minutes Intravenous Every 6 hours 01/22/24 9386 01/24/24 0957   01/21/24 2330  ceFEPIme  (MAXIPIME ) 2 g in sodium chloride  0.9 % 100 mL IVPB        2 g 200 mL/hr over 30 Minutes Intravenous  Once 01/21/24 2316 01/21/24 2356   01/21/24 2330  metroNIDAZOLE  (FLAGYL ) IVPB 500 mg        500 mg 100 mL/hr over 60 Minutes Intravenous  Once 01/21/24 2316 01/22/24 0109        Assessment/Plan  Hx of kidney-pancreas transplant 1999, followed at [facility]  Internal hernia with small bowel volvulus and 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 Dasie  POD9 ex-lap, abdominal washout, ileocolic anastomosis, abdominal closure Dr. Aron - Advance to soft  diet - Continue TPN, will consult dietitian for calorie count - Has completed 7 days abx postop. WBC remains somewhat stably elevated at 18K but no other signs of infection, may be related to steroids.  -CT 11/26 negative for intra-abdominal complication, appreciate Dr. Patel/nephrology, ruling out pyelonephritis - BID WTD to midline wound - T1DM s/p explant of transplanted pancreas: blood sugars have been labile, on low dose Semglee  and sliding scale. CGM placed yesterday. - fiber supplement for diarrhea, check a C. difficile -Needs to mobilize  FEN: soft, TPN (wean tomorrow if tolerating FLD), fiber supplement today  VTE: SQH ID: Zosyn  11/17>11/25   - per TRH -  HTN HLD Asthma Hx of CVA    LOS: 10 days    Mitzie DELENA Freund, MD  [facility] 02/01/2024, 9:58 AM Please see Amion for pager number during day hours 7:00am-4:30pm

## 2024-02-01 NOTE — Progress Notes (Signed)
 Brief Nutrition Support Note   Consult for calorie count received. Pt currently on TPN, diet advanced from full liquids to GI Soft this  am. Per Ivey, TPN can be weaned off once patient tolerating soft diet . Message sent to RN to  hang calorie count envelope on the patient's door and to document percent consumed for each item on the patient's meal tray ticket including any supplements and keep in envelope RD to review.    INTERVENTION:  Continue TPN Management per pharmacy  GI Soft diet Start CREON TID with meals, dosing units per hospitalist/pharmacy [place] 1.4 PO daily, each supplement provides 455 kcal and 20 grams protein. Can add Fiber supplement following diet advancement if patient continues to have liquid stool  Start 48 hour calorie count      Madalyn Potters, MS, RD, LDN Clinical Dietitian  Contact via secure chat. If unavailable, use group chat RD Inpatient.

## 2024-02-01 NOTE — Progress Notes (Signed)
 PHARMACY - TOTAL PARENTERAL NUTRITION CONSULT NOTE   Indication: Prolonged ileus  Patient Measurements: Height: 5' 1 (154.9 cm) Weight: 45.3 kg (99 lb 13.9 oz) IBW/kg (Calculated) : 47.8 TPN AdjBW (KG): 51.6 Body mass index is 18.87 kg/m. 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 Patient was extubated 11/19. Patient is having bowel function but also nausea and vomiting with likely ileus. NGT placed to low-intermittent suction and patient made NPO. Pharmacy consulted for TPN.   Patient has a noted intolerance to soy as nausea - we will use SMOFlipids, which of our two lipid choices, has the least amount of soybean oil and monitor tolerance.   Glucose / Insulin : CBGs 122-326. Utilized 8 units of SSI and 8 units semglee  in last 24 hours. TPN contains 8 units of regular insulin . pred5  Patient has been intermittently refusing sliding scale insulin   Monitoring patient response closely in setting of removed transplanted pancreas. DM Coordinator added CGM monitor on 11/24.   Electrolytes: Na 135, K 4.4 (goal >/=4), Ch 106, Ca 8.3 [CoCa 9.82], Phos 3.9, Mg 1.8 (goal >/= 2)- patient refused 2 gram iv magnesium  ordered 11/26 Renal: SCr  0.91, BUN 28 Hepatic: LFTs/Tbili/TG within normal limits. Albumin  2.1 Intake / Output; MIVF: UOP 3+ unmeasured. LBM 11/26 [3+ unmeasured] GI Imaging: 11/17 KUB - dilated small bowel loops consistent with obstruction 11/17 CT Abd - distal small bowel obstruction d/t mesenteric volvulus,  no free gas, trace ascites 11/21 DG Abd decrease gaseous distention of small bowel loops 11/26 CT Abd no bowel obstruction GI Surgeries / Procedures:  11/17 Exlap resection of distal ileum and ascending colon, explant of transplanted pancreas, and ABThera VAC placement  11/18 abdominal washout, ileocolic anastomosis, and abdominal closure  Central access: 01/26/24 TPN start date: 01/26/24  Nutritional Goals: Goal TPN rate is 65 mL/hr (provides 83 g of protein and 1515 kcals per day)  RD Assessment: Estimated Needs Total Energy Estimated Needs: 1500-1700 kcals Total Protein Estimated Needs: 75-90 g Total Fluid Estimated Needs: 1.5L  Current Nutrition:  NPO and TPN *Will need pancreatic enzymes once taking enteral diet  Plan:  Continue TPN  65 mL/hr at 1800- providing ~90 grams of protein and 1502 Kcal meeting 100% of needs  TPN provides ~174.72 g dextrose  Electrolytes in TPN: Na 60 mEq/L, K 50 mEq/L, Ca 5 mEq/L, Mg 10 mEq/L, and Phos 20 mmol/L. Cl:Ac 1:1 Add standard MVI and trace elements to TPN Add Thiamine 100 mg to TPN - through 02/02/24. Continue very Sensitive q4h SSI and basal insulin  with Semglee  6 units daily per MD and DM Coordinator.  TPN regular insulin  to 7 units - DM Coordinator and MD aware Mag 2 gm iv x1 Monitor TPN labs on Mon/Thurs, prn    Yeudiel Mateo BS, PharmD, BCPS Clinical Pharmacist 02/01/2024 7:01 AM  Contact: [phone] after 3 PM

## 2024-02-01 NOTE — Progress Notes (Addendum)
 Progress Note   Patient: Sherri Todd FMW:[id number] DOB: 01/02/1961 DOA: 01/21/2024     10 DOS: the patient was seen and examined on 02/01/2024   Brief hospital course: Sherri Todd was admitted to the hospital with the working diagnosis of peritonitis and mesenteric volvulus.   63 yo female with the past medical history of kidney and pancreas transplantation in 1999, T1DM, hypertension and dyslipidemia who presented with abdominal pain, nausea and vomiting. She had worsening symptoms for the last 24 hrs prior to admission, because of severe pain she called EMS. She was found hypotensive, with systolic blood pressure in the 70's, she had 250 ml LR IV bolus and was transported to the ED. On the initial physical examination she continue to be hypotensive despite IV fluids, and was placed on vasopressors.  On admission blood pressure 102/53, HR 117, RR 26 and 02 saturation 97%  Lungs with no wheezing or rhonchi, heart with S1 and S2 present and tachycardic, abdomen with tenderness to palpation, distended but compressible, well healed lower surgical scar.   Patient underwent emergent laparotomy for small bowel obstruction secondary to mesenteric volvulus 1118 return 2 OR anastomosis closure 11/19 extubated 11/21 PICC line placed TPN started, transferred to [facility].    Assessment and Plan: * Small bowel ischemia Peritonitis with necrosis and mesenteric volvulus.  Septic shock has resolved. (Present on admission) Initial surgery on 11.17 for internal hernia with small bowel volvulus and necrosis of the terminal ileum and ascending colon.  11/18 Reopening of prior laparotomy, ileocolonic anastamosis at proximal transverse colon, suture repair upon closure of umbilical hernia   11/26 CT abdomen and pelvis left lower quadrant renal transplant with mild hydronephrosis. Areas of parenchymal atrophy and cortical scarring predominantly involving the posterior  transplant kidney likely due to areas of  infarct.  Small peri transplant fluid collection relatively similar or slightly increased since prior imaging, without significant mass effect.  Postsurgical changes of the bowel with ileocolic anastomosis in the right upper abdomen. No bowel obstruction.   Follow up wbc is 18.0  Cultures have been with no growth.  Recovering from post operative ileus.  Currently on TPN for nutritional support.  Plan to advance diet to soft today  Out of bed.  Continue pain control with tramadol , discontinue hydromorphone  Change pantoprazole  to po.   AKI (acute kidney injury) Hyponatremia, hypo and hyperkalemia   Renal function today with serum cr at 0,75 with K at 4,4 and serum bicarbonate at 21 Na 135 Mg 1.8   Plan to continue TPN and follow up renal function and electrolytes in am.  Add oral magnesium  oxide.   Type 1 diabetes mellitus (HCC) Continue insulin  basal 6 units and sliding scale.  Fasting glucose this am 168 mg/dl   Sp simultaneous renal and pancreas transplant in 1999  Continue tacrolimus , mycophenolate  and prednisone    Essential hypertension Continue blood pressure monitoring  History of CVA (cerebrovascular accident) Continue blood pressure monitoring   Malnutrition of moderate degree Continue nutritional support with TPN   Depression Positive anxiety  Plan to continue fluoxetine    Positive confusion, not quite encephalopathy, patient has high risk for delirium.  Continue supportive therapy      Subjective: Patient is feeling better, no chest pain, no dyspnea, no nausea or vomiting. Tolerating po clears well  Physical Exam: Vitals:   02/01/24 0355 02/01/24 0600 02/01/24 0809 02/01/24 1132  BP:   134/65 (!) 128/50  Pulse:   81 81  Resp: (!) 23 20 18  19  Temp:   98 F (36.7 C) 98 F (36.7 C)  TempSrc:   Oral Oral  SpO2:   98% 96%  Weight:      Height:       Neurology awake and alert ENT with mild pallor  Cardiovascular with S1 and S2 present and regular  with no gallops, rubs or murmurs Respiratory with no rales or wheezing, no rhonchi  Abdomen with no distention, surgical dressing in place, non tender to superficial palpation, soft.   No lower extremity edema.   Data Reviewed:    Family Communication: no family at the bedside   Disposition: Status is: Inpatient Remains inpatient appropriate because: recovering   Planned Discharge Destination: Home     Author: Elidia Toribio Furnace, MD 02/01/2024 1:35 PM  For on call review [email or web address].

## 2024-02-02 DIAGNOSIS — I10 Essential (primary) hypertension: Secondary | ICD-10-CM | POA: Diagnosis not present

## 2024-02-02 DIAGNOSIS — N179 Acute kidney failure, unspecified: Secondary | ICD-10-CM | POA: Diagnosis not present

## 2024-02-02 DIAGNOSIS — E1069 Type 1 diabetes mellitus with other specified complication: Secondary | ICD-10-CM | POA: Diagnosis not present

## 2024-02-02 DIAGNOSIS — K559 Vascular disorder of intestine, unspecified: Secondary | ICD-10-CM | POA: Diagnosis not present

## 2024-02-02 LAB — CBC WITH DIFFERENTIAL/PLATELET
Abs Immature Granulocytes: 0.19 K/uL — ABNORMAL HIGH (ref 0.00–0.07)
Basophils Absolute: 0 K/uL (ref 0.0–0.1)
Basophils Relative: 0 %
Eosinophils Absolute: 0.2 K/uL (ref 0.0–0.5)
Eosinophils Relative: 1 %
HCT: 24.3 % — ABNORMAL LOW (ref 36.0–46.0)
Hemoglobin: 8 g/dL — ABNORMAL LOW (ref 12.0–15.0)
Immature Granulocytes: 1 %
Lymphocytes Relative: 6 %
Lymphs Abs: 1.1 K/uL (ref 0.7–4.0)
MCH: 34.9 pg — ABNORMAL HIGH (ref 26.0–34.0)
MCHC: 32.9 g/dL (ref 30.0–36.0)
MCV: 106.1 fL — ABNORMAL HIGH (ref 80.0–100.0)
Monocytes Absolute: 1.6 K/uL — ABNORMAL HIGH (ref 0.1–1.0)
Monocytes Relative: 9 %
Neutro Abs: 14.8 K/uL — ABNORMAL HIGH (ref 1.7–7.7)
Neutrophils Relative %: 83 %
Platelets: 260 K/uL (ref 150–400)
RBC: 2.29 MIL/uL — ABNORMAL LOW (ref 3.87–5.11)
RDW: 16.2 % — ABNORMAL HIGH (ref 11.5–15.5)
WBC: 17.8 K/uL — ABNORMAL HIGH (ref 4.0–10.5)
nRBC: 0 % (ref 0.0–0.2)

## 2024-02-02 LAB — GLUCOSE, CAPILLARY
Glucose-Capillary: 162 mg/dL — ABNORMAL HIGH (ref 70–99)
Glucose-Capillary: 203 mg/dL — ABNORMAL HIGH (ref 70–99)
Glucose-Capillary: 246 mg/dL — ABNORMAL HIGH (ref 70–99)
Glucose-Capillary: 255 mg/dL — ABNORMAL HIGH (ref 70–99)
Glucose-Capillary: 255 mg/dL — ABNORMAL HIGH (ref 70–99)
Glucose-Capillary: 296 mg/dL — ABNORMAL HIGH (ref 70–99)
Glucose-Capillary: 336 mg/dL — ABNORMAL HIGH (ref 70–99)

## 2024-02-02 LAB — MAGNESIUM: Magnesium: 1.9 mg/dL (ref 1.7–2.4)

## 2024-02-02 MED ORDER — OXYCODONE HCL 5 MG PO TABS: 5.0000 mg | ORAL_TABLET | ORAL | Status: DC | PRN

## 2024-02-02 MED ORDER — TRAVASOL 10 % IV SOLN
INTRAVENOUS | Status: AC
  Filled 2024-02-02: qty 904.8

## 2024-02-02 MED ORDER — INSULIN GLARGINE-YFGN 100 UNIT/ML ~~LOC~~ SOLN
8.0000 [IU] | Freq: Every day | SUBCUTANEOUS | Status: DC
  Administered 2024-02-02 – 2024-02-03 (×2): 8 [IU] via SUBCUTANEOUS
  Filled 2024-02-02 (×4): qty 0.08

## 2024-02-02 MED ORDER — MAGNESIUM SULFATE 2 GM/50ML IV SOLN
INTRAVENOUS | Status: AC
  Filled 2024-02-02: qty 50

## 2024-02-02 MED ORDER — MAGNESIUM SULFATE IN D5W 1-5 GM/100ML-% IV SOLN
1.0000 g | Freq: Once | INTRAVENOUS | Status: AC
  Administered 2024-02-02: 1 g via INTRAVENOUS
  Filled 2024-02-02: qty 100

## 2024-02-02 MED ORDER — TRAMADOL HCL 50 MG PO TABS: 50.0000 mg | ORAL_TABLET | Freq: Four times a day (QID) | ORAL | Status: DC | PRN

## 2024-02-02 MED ORDER — PANCRELIPASE (LIP-PROT-AMYL) 12000-38000 UNITS PO CPEP
12000.0000 [IU] | ORAL_CAPSULE | Freq: Three times a day (TID) | ORAL | Status: DC
  Administered 2024-02-02 – 2024-02-06 (×12): 12000 [IU] via ORAL
  Filled 2024-02-02 (×14): qty 1

## 2024-02-02 MED ORDER — ACETAMINOPHEN 325 MG PO TABS
650.0000 mg | ORAL_TABLET | Freq: Four times a day (QID) | ORAL | Status: DC
  Administered 2024-02-02 – 2024-02-03 (×7): 650 mg via ORAL
  Filled 2024-02-02 (×9): qty 2

## 2024-02-02 MED ORDER — HYDROMORPHONE HCL 1 MG/ML IJ SOLN: 0.5000 mg | INTRAMUSCULAR | Status: DC | PRN

## 2024-02-02 NOTE — Inpatient Diabetes Management (Signed)
 Inpatient Diabetes Program Recommendations  AACE/ADA: New Consensus Statement on Inpatient Glycemic Control (2015)  Target Ranges:  Prepandial:   less than 140 mg/dL      Peak postprandial:   less than 180 mg/dL (1-2 hours)      Critically ill patients:  140 - 180 mg/dL   Lab Results  Component Value Date   GLUCAP 203 (H) 02/02/2024   HGBA1C 5.0 01/22/2024    Review of Glycemic Control  Latest Reference Range & Units 02/01/24 16:22 02/01/24 20:11 02/01/24 23:50 02/02/24 04:40 02/02/24 09:06  Glucose-Capillary 70 - 99 mg/dL 816 (H) 760 (H) 753 (H) 255 (H) 203 (H)  (H): Data is abnormally high Diabetes history: DM 1 (transplanted pancreas removed) Outpatient Diabetes medications:  None Current orders for Inpatient glycemic control:  Novolog  0-6 units q 4 hours Semglee  6 units daily TPN-(7 units/liter)  Inpatient Diabetes Program Recommendations:    Consider increasing Semglee  8 units every day.  Thanks, Tinnie Minus, MSN, RNC-OB Diabetes Coordinator [phone] (8a-5p)

## 2024-02-02 NOTE — Progress Notes (Signed)
 Occupational Therapy Treatment Patient Details Name: Sherri Todd MRN: [id number] DOB: 22-Aug-1960 Today's Date: 02/02/2024   History of present illness Pt is a 63 y.o female presented to ED 11/17 for R sided abdominal pain. CT showed small bowel obstruction. Intubated 11/17 for S/p ex lap, resection of distal ileum and ascending colon, explant of transplanted pancreas, temporary closure. 11/18 S/p reopening of prior laparotomy,  ileocolonic anastamosis at proximal transverse colon, suture repair upon closure of umbilical hernia. Extubated 11/19. PMH:  kidney-pancreas transplant 1999, HTN, CVA, DM   OT comments  Pt progressing well towards goals. Progressed to complete standing ADLs and toileting task with supervision for safety. Pt primarily limited by 7/10 abdominal pain, and decreased activity tolerance. Continue to recommend >3 hours of skilled rehab daily to optimize independence levels. Will continue to follow acutely.       If plan is discharge home, recommend the following:  Assistance with cooking/housework;Assist for transportation;Help with stairs or ramp for entrance;A little help with walking and/or transfers;A little help with bathing/dressing/bathroom;Direct supervision/assist for medications management   Equipment Recommendations  BSC/3in1       Precautions / Restrictions Precautions Precautions: Fall;Other (comment) Recall of Precautions/Restrictions: Intact Precaution/Restrictions Comments: Abdominal for comfort Restrictions Weight Bearing Restrictions Per Provider Order: No       Mobility Bed Mobility Overal bed mobility: Modified Independent     General bed mobility comments: HOB elevated, increased time    Transfers Overall transfer level: Needs assistance Equipment used: Rolling walker (2 wheels) Transfers: Sit to/from Stand Sit to Stand: Supervision           General transfer comment: S for safety     Balance Overall balance assessment:  Needs assistance Sitting-balance support: Feet supported Sitting balance-Leahy Scale: Good     Standing balance support: No upper extremity supported, Bilateral upper extremity supported Standing balance-Leahy Scale: Fair Standing balance comment: benefits from UE support           ADL either performed or assessed with clinical judgement   ADL Overall ADL's : Needs assistance/impaired     Grooming: Oral care;Wash/dry face;Wash/dry hands;Supervision/safety;Standing       Statistician: Supervision/safety;Ambulation;Regular Toilet;Rolling walker (2 wheels)   Toileting- Clothing Manipulation and Hygiene: Supervision/safety;Sit to/from stand       Functional mobility during ADLs: Supervision/safety;Rolling walker (2 wheels) General ADL Comments: Increased time d/t pain, but improved activity tolerance    Extremity/Trunk Assessment Upper Extremity Assessment Upper Extremity Assessment: Generalized weakness   Lower Extremity Assessment Lower Extremity Assessment: Defer to PT evaluation        Vision   Vision Assessment?: No apparent visual deficits         Communication Communication Communication: No apparent difficulties   Cognition Arousal: Alert Behavior During Therapy: WFL for tasks assessed/performed Cognition: No apparent impairments             OT - Cognition Comments: Improved attention and executive functioning skills from previous sessions     Following commands: Intact        Cueing   Cueing Techniques: Verbal cues        General Comments VSS on RA    Pertinent Vitals/ Pain       Pain Assessment Pain Assessment: 0-10 Pain Score: 7  Pain Location: Abdomen Pain Descriptors / Indicators: Discomfort, Grimacing Pain Intervention(s): Limited activity within patient's tolerance   Frequency  Min 2X/week        Progress Toward Goals  OT Goals(current goals can now be  found in the care plan section)  Progress towards OT goals:  Progressing toward goals  Acute Rehab OT Goals Patient Stated Goal: To get better OT Goal Formulation: With patient Time For Goal Achievement: 02/08/24 Potential to Achieve Goals: Good ADL Goals Pt Will Perform Grooming: with supervision;standing Pt Will Perform Lower Body Dressing: with supervision;sit to/from stand;sitting/lateral leans Pt Will Transfer to Toilet: with supervision;ambulating;regular height toilet Pt Will Perform Toileting - Clothing Manipulation and hygiene: with supervision;sitting/lateral leans;sit to/from stand Additional ADL Goal #1: Pt will verbalize at least 3 energy conservation strategies for ADLs  Plan         AM-PAC OT 6 Clicks Daily Activity     Outcome Measure   Help from another person eating meals?: None Help from another person taking care of personal grooming?: A Little Help from another person toileting, which includes using toliet, bedpan, or urinal?: A Little Help from another person bathing (including washing, rinsing, drying)?: A Little Help from another person to put on and taking off regular upper body clothing?: A Little Help from another person to put on and taking off regular lower body clothing?: A Little 6 Click Score: 19    End of Session Equipment Utilized During Treatment: Rolling walker (2 wheels)  OT Visit Diagnosis: Unsteadiness on feet (R26.81);Other abnormalities of gait and mobility (R26.89);Muscle weakness (generalized) (M62.81)   Activity Tolerance Patient limited by pain   Patient Left in chair;with call bell/phone within reach;with chair alarm set   Nurse Communication Mobility status        Time: 9269-9248 OT Time Calculation (min): 21 min  Charges: OT General Charges $OT Visit: 1 Visit OT Treatments $Self Care/Home Management : 8-22 mins  Adrianne BROCKS, OT  Acute Rehabilitation Services Office [phone] Secure chat preferred   Adrianne GORMAN Savers 02/02/2024, 8:54 AM

## 2024-02-02 NOTE — Progress Notes (Signed)
 Physical Therapy Treatment Patient Details Name: Sherri Todd MRN: [id number] DOB: May 12, 1960 Today's Date: 02/02/2024   History of Present Illness Pt is a 63 y.o female presented to ED 11/17 for R sided abdominal pain. CT showed small bowel obstruction. Intubated 11/17 for S/p ex lap, resection of distal ileum and ascending colon, explant of transplanted pancreas, temporary closure. 11/18 S/p reopening of prior laparotomy,  ileocolonic anastamosis at proximal transverse colon, suture repair upon closure of umbilical hernia. Extubated 11/19. PMH:  kidney-pancreas transplant 1999, HTN, CVA, DM    PT Comments  Pt received getting up from toilet after pulling call bell a few mins prior, pt agreeable to therapy session, able to perform her own hygiene and with Fair static standing balance with self-care tasks unsupported. Pt needing up to CGA for stair negotiation for simulated flight of stairs and able to indicate well when she is becoming fatigued and needs a seated break. Min safety cues with transfers, anticipate pt would be safe to DC home with HHPT vs OPPT once medically cleared, discussed with supervising PT Ryan L.    If plan is discharge home, recommend the following: Assist for transportation;Assistance with cooking/housework;Help with stairs or ramp for entrance;A little help with walking and/or transfers   Can travel by private vehicle        Equipment Recommendations  BSC/3in1;Rolling walker (2 wheels)    Recommendations for Other Services       Precautions / Restrictions Precautions Precautions: Fall;Other (comment) Recall of Precautions/Restrictions: Intact Precaution/Restrictions Comments: Abdominal for comfort; Enteric precs Restrictions Weight Bearing Restrictions Per Provider Order: No     Mobility  Bed Mobility Overal bed mobility: Modified Independent             General bed mobility comments: pt received up at sink; in chair at end of session     Transfers Overall transfer level: Needs assistance Equipment used: Rolling walker (2 wheels) Transfers: Sit to/from Stand Sit to Stand: Supervision           General transfer comment: S for safety, needs reminders to push from/reach back for chair for safety    Ambulation/Gait Ambulation/Gait assistance: Supervision Gait Distance (Feet): 20 Feet Assistive device: Rolling walker (2 wheels) Gait Pattern/deviations: Step-to pattern, Decreased stride length, Shuffle     Pre-gait activities: BLE foot taps on 7 step x10 reps ea prior to stairs as pre-stepping activity to build confidence in task and to ensure safety/decreased pain with technique General Gait Details: short distance in room after toileting, pt with good awareness of IV line, HR 90's bpm wtih exertion; see stairs section   Stairs Stairs: Yes Stairs assistance: Contact guard assist, Supervision Stair Management: Two rails, Step to pattern, Forwards, Backwards Number of Stairs: 10 (5 steps, seated break, then 10 steps) General stair comments: single 7 platform step in room x5 then x10 reps with seated break between to recover, pt reports 5/10 modified RPE post-exertion. Pt able to alternate which foot steps up without increased c/o pain. no buckling or LOB, pt relies on BUE support.   Wheelchair Mobility     Tilt Bed    Modified Klink (Stroke Patients Only)       Balance Overall balance assessment: Needs assistance Sitting-balance support: Feet supported Sitting balance-Leahy Scale: Good     Standing balance support: No upper extremity supported, Bilateral upper extremity supported Standing balance-Leahy Scale: Fair Standing balance comment: benefits from UE support but able to place maxi-pad and pull up pajama pants in standing  with CGA/Supervision                            Communication Communication Communication: No apparent difficulties  Cognition Arousal: Alert Behavior During  Therapy: WFL for tasks assessed/performed   PT - Cognitive impairments: No apparent impairments                       PT - Cognition Comments: cooperative, able to report symptoms/fatigue level well, good line awareness today. Following commands: Intact      Cueing Cueing Techniques: Verbal cues  Exercises Other Exercises Other Exercises: standing foot taps on step x10 reps ea Other Exercises: reviewed supine ankle pumps, quad sets, heel slides, hip ab/adduction as tolerated, pt defer till back in bed, performs ankle pumps x10 reps    General Comments General comments (skin integrity, edema, etc.): VSS on RA, HR 90's bpm      Pertinent Vitals/Pain Pain Assessment Pain Assessment: 0-10 Pain Score: 5  Pain Location: Abdomen Pain Descriptors / Indicators: Discomfort, Grimacing Pain Intervention(s): Limited activity within patient's tolerance, Monitored during session, Repositioned    Home Living                          Prior Function            PT Goals (current goals can now be found in the care plan section) Acute Rehab PT Goals Patient Stated Goal: to improve mobility and decrease pain PT Goal Formulation: With patient Time For Goal Achievement: 02/08/24 Progress towards PT goals: Progressing toward goals    Frequency    Min 3X/week      PT Plan      Co-evaluation              AM-PAC PT 6 Clicks Mobility   Outcome Measure  Help needed turning from your back to your side while in a flat bed without using bedrails?: None Help needed moving from lying on your back to sitting on the side of a flat bed without using bedrails?: A Little Help needed moving to and from a bed to a chair (including a wheelchair)?: A Little Help needed standing up from a chair using your arms (e.g., wheelchair or bedside chair)?: A Little Help needed to walk in hospital room?: A Little Help needed climbing 3-5 steps with a railing? : A Little 6 Click  Score: 19    End of Session Equipment Utilized During Treatment: Oxygen;Gait belt Activity Tolerance: Patient tolerated treatment well Patient left: in chair;with call bell/phone within reach;with family/visitor present;with chair alarm set (spouse in room but preparing to leave; NT notified chair alarm not on but per NT, pt has been compliant with call bell use) Nurse Communication: Mobility status;Precautions;Other (comment) (5/10 pain) PT Visit Diagnosis: Unsteadiness on feet (R26.81);Other abnormalities of gait and mobility (R26.89);Muscle weakness (generalized) (M62.81);Pain Pain - part of body:  (abdomen)     Time: 8397-8379 PT Time Calculation (min) (ACUTE ONLY): 18 min  Charges:    $Gait Training: 8-22 mins PT General Charges $$ ACUTE PT VISIT: 1 Visit                     Shima Compere P., PTA Acute Rehabilitation Services Secure Chat Preferred 9a-5:30pm Office: [phone]    Connell HERO [facility] 02/02/2024, 4:45 PM

## 2024-02-02 NOTE — Progress Notes (Signed)
 Patient ID: Sherri Todd, female   DOB: 1960/11/23, 63 y.o.   MRN: [id number] [facility] Progress Note   Assessment/ Plan:   1. Acute kidney Injury: Renal function back to baseline and she remains nonoliguric after recovery from initial oliguric acute kidney injury in the setting of shock/mild ATN.  No acute electrolyte abnormalities. 2.  History of simultaneous kidney/pancreas transplantation in 1999: Now back on oral tacrolimus , MMF and prednisone  (after previously holding MMF and conversion to SL tacrolimus  and Solu-Medrol ).  Tacrolimus  trough level pending. 3.  Ischemic bowel: Postoperative day #11 after exploratory laparotomy and resection of distal ileum/ascending colon and explant of transplanted pancreas for the management of internal hernia/small bowel volvulus with necrosis of terminal ileum and ascending colon.  Tolerating clear liquids and with plan to wean TPN based on calorie count.  No diarrhea over the past 24 hours and with concerns now raised with her abdominal pain. 4.  Septic shock: Appears to have improved/resolved status post management of ischemic bowel with surgical intervention.  Previously on Zosyn  that was discontinued after completion of 7 days of antibiotics. 5.  Encephalopathy: Currently suspected to be from hospital delirium.  CT scan of the abdomen/pelvis without any focal infection and urinalysis without distinct evidence of UTI (urine cultures pending).  Subjective:   Complains of abdominal pain and nausea this morning without abdominal distention.  Scant bowel movement overnight without diarrhea but remains on enteric precautions.  Objective:   BP (!) 122/58 (BP Location: Left Arm)   Pulse 91   Temp 98.5 F (36.9 C) (Oral)   Resp 20   Ht 5' 1 (1.549 m)   Wt 45.3 kg   SpO2 96%   BMI 18.87 kg/m   Intake/Output Summary (Last 24 hours) at 02/02/2024 0838 Last data filed at 02/02/2024 0300 Gross per 24 hour  Intake 1663.75 ml  Output  1400 ml  Net 263.75 ml   Weight change:   Physical Exam: Gen: Appears uncomfortable sitting up in recliner.  Husband at bedside. CVS: Pulse regular rhythm, normal rate, S1 and S2 normal Resp: Diminished breath sounds over bases, poor inspiratory effort, no rales/rhonchi Abd: Intact abdominal midline dressing, bowel sounds scant Ext: No lower extremity edema  Imaging: CT ABDOMEN PELVIS W CONTRAST Result Date: 01/31/2024 CLINICAL DATA:  Evaluate for abscess. EXAM: CT ABDOMEN AND PELVIS WITH CONTRAST TECHNIQUE: Multidetector CT imaging of the abdomen and pelvis was performed using the standard protocol following bolus administration of intravenous contrast. RADIATION DOSE REDUCTION: This exam was performed according to the departmental dose-optimization program which includes automated exposure control, adjustment of the mA and/or kV according to patient size and/or use of iterative reconstruction technique. CONTRAST:  75mL OMNIPAQUE  IOHEXOL  350 MG/ML SOLN COMPARISON:  CT abdomen pelvis dated 01/22/2024. FINDINGS: Lower chest: The visualized lung bases are clear. No intra-abdominal free air. There is diffuse mesenteric edema and trace ascites. Hepatobiliary: The liver is unremarkable. No biliary dilatation. The gallbladder is contracted. No calcified gallstone. Pancreas: Unremarkable. No pancreatic ductal dilatation or surrounding inflammatory changes. Spleen: Normal in size without focal abnormality. Adrenals/Urinary Tract: The native kidneys are atrophic. There is a left lower quadrant renal transplant. There is mild hydronephrosis of the transplant kidney. Areas of parenchyma atrophy and cortical scarring predominantly involving the posterior transplant kidney likely due to areas of infarct. Clinical correlation recommended to evaluate for possibility of superimposed infection. No stone. There is a small peritransplant fluid collection relatively similar or slightly increased since the prior CT  without  significant mass effect. A small cyst in the inferior pole of the transplant kidney. The urinary bladder is grossly other. Stomach/Bowel: There is postsurgical changes of the bowel with ileocolic anastomosis in the right upper abdomen. There is sigmoid diverticulosis. No bowel obstruction. Appendectomy. Vascular/Lymphatic: Mild aortoiliac atherosclerotic disease. The IVC is unremarkable. No portal venous gas. No adenopathy. Reproductive: Hysterectomy. Other: Midline vertical anterior pelvic wall incisional scar and anterior abdominal wall open wound. Musculoskeletal: No acute osseous pathology. IMPRESSION: 1. Left lower quadrant renal transplant with mild hydronephrosis. Areas of parenchyma atrophy and cortical scarring predominantly involving the posterior transplant kidney likely due to areas of infarct. Clinical correlation recommended to evaluate for possibility of superimposed infection. 2. Small peritransplant fluid collection relatively similar or slightly increased since the prior CT without significant mass effect. 3. Postsurgical changes of the bowel with ileocolic anastomosis in the right upper abdomen. No bowel obstruction. 4. Sigmoid diverticulosis. 5.  Aortic Atherosclerosis (ICD10-I70.0). Electronically Signed   By: Vanetta Chou M.D.   On: 01/31/2024 19:34    Labs: BMET Recent Labs  Lab 01/27/24 0545 01/27/24 1517 01/27/24 1917 01/28/24 0223 01/28/24 1755 01/29/24 0510 01/30/24 0330 01/31/24 0442 02/01/24 0624  NA 143  --  141 142 139 136 134* 133* 135  K 2.6*   < > 3.5 3.4* 5.4* 5.0 5.1 4.2 4.4  CL 101  --  104 105 105 102 103 104 106  CO2 31  --  31 27 21* 24 21* 21* 21*  GLUCOSE 295*  --  75 116* 261* 300* 341* 122* 168*  BUN 28*  --  23 23 26* 25* 28* 28* 25*  CREATININE 0.98  --  0.82 0.83 0.97 0.90 0.91 0.84 0.75  CALCIUM  8.5*  --  8.6* 8.4* 8.4* 8.3* 8.7* 8.3* 8.3*  PHOS 2.1*  --  2.7 2.9 3.9 3.6 3.8  --  3.9   < > = values in this interval not displayed.    CBC Recent Labs  Lab 01/30/24 0330 01/31/24 0442 02/01/24 0624 02/02/24 0441  WBC 16.9* 21.0* 18.0* 17.8*  NEUTROABS 14.1* 17.4* 15.0* 14.8*  HGB 9.6* 8.1* 8.0* 8.0*  HCT 29.7* 24.7* 24.9* 24.3*  MCV 106.8* 104.7* 106.0* 106.1*  PLT 208 220 247 260    Medications:     acetaminophen   650 mg Oral Q6H   Chlorhexidine  Gluconate Cloth  6 each Topical Daily   enoxaparin  (LOVENOX ) injection  30 mg Subcutaneous Q24H   feeding supplement (KATE [employer]ARMS STANDARD ENT 1.4)  325 mL Oral BID BM   FLUoxetine   20 mg Oral Daily   insulin  aspart  0-6 Units Subcutaneous Q4H   insulin  glargine-yfgn  6 Units Subcutaneous Daily   magnesium  oxide  400 mg Oral BID   mycophenolate   500 mg Oral BID   pantoprazole   40 mg Oral Daily   polycarbophil  625 mg Oral Daily   predniSONE   5 mg Oral Q breakfast   sodium chloride  flush  10-40 mL Intracatheter Q12H   tacrolimus   1 mg Oral BID    Gordy Blanch, MD 02/02/2024, 8:38 AM

## 2024-02-02 NOTE — Progress Notes (Signed)
 Progress Note  10 Days Post-Op  Subjective: Reports some abdominal soreness this morning, thinks it started when she got the contrast for her CT  Objective: Vital signs in last 24 hours: Temp:  [98 F (36.7 C)-98.5 F (36.9 C)] 98.5 F (36.9 C) (11/28 0440) Pulse Rate:  [81-91] 91 (11/27 2357) Resp:  [19-20] 20 (11/28 0440) BP: (122-139)/(50-84) 122/58 (11/28 0440) SpO2:  [96 %-100 %] 96 % (11/28 0440) Last BM Date : 02/01/24  Intake/Output from previous day: 11/27 0701 - 11/28 0700 In: 1663.8 [P.O.:600; I.V.:1013.8; IV Piggyback:50] Out: 1400 [Urine:1400] Intake/Output this shift: No intake/output data recorded.  PE: General: resting in bed, NAD Heart: RRR Lungs: nonlabored respirations Abd: soft, mildly diffusely tender which is new this morning, mildly distended. Midline wound is open at the skin, wound bed is clean and dry, fascia intact.  Surrounding skin maceration from damp dressings   Lab Results:  Recent Labs    02/01/24 0624 02/02/24 0441  WBC 18.0* 17.8*  HGB 8.0* 8.0*  HCT 24.9* 24.3*  PLT 247 260   BMET Recent Labs    01/31/24 0442 02/01/24 0624  NA 133* 135  K 4.2 4.4  CL 104 106  CO2 21* 21*  GLUCOSE 122* 168*  BUN 28* 25*  CREATININE 0.84 0.75  CALCIUM  8.3* 8.3*   PT/INR No results for input(s): LABPROT, INR in the last 72 hours. CMP     Component Value Date/Time   NA 135 02/01/2024 0624   K 4.4 02/01/2024 0624   CL 106 02/01/2024 0624   CO2 21 (L) 02/01/2024 0624   GLUCOSE 168 (H) 02/01/2024 0624   BUN 25 (H) 02/01/2024 0624   CREATININE 0.75 02/01/2024 0624   CALCIUM  8.3 (L) 02/01/2024 0624   PROT 4.8 (L) 02/01/2024 0624   ALBUMIN  2.1 (L) 02/01/2024 0624   AST 19 02/01/2024 0624   ALT 25 02/01/2024 0624   ALKPHOS 55 02/01/2024 0624   BILITOT 0.3 02/01/2024 0624   GFRNONAA >60 02/01/2024 0624   Lipase  No results found for: LIPASE     Studies/Results: CT ABDOMEN PELVIS W CONTRAST Result Date:  01/31/2024 CLINICAL DATA:  Evaluate for abscess. EXAM: CT ABDOMEN AND PELVIS WITH CONTRAST TECHNIQUE: Multidetector CT imaging of the abdomen and pelvis was performed using the standard protocol following bolus administration of intravenous contrast. RADIATION DOSE REDUCTION: This exam was performed according to the departmental dose-optimization program which includes automated exposure control, adjustment of the mA and/or kV according to patient size and/or use of iterative reconstruction technique. CONTRAST:  75mL OMNIPAQUE  IOHEXOL  350 MG/ML SOLN COMPARISON:  CT abdomen pelvis dated 01/22/2024. FINDINGS: Lower chest: The visualized lung bases are clear. No intra-abdominal free air. There is diffuse mesenteric edema and trace ascites. Hepatobiliary: The liver is unremarkable. No biliary dilatation. The gallbladder is contracted. No calcified gallstone. Pancreas: Unremarkable. No pancreatic ductal dilatation or surrounding inflammatory changes. Spleen: Normal in size without focal abnormality. Adrenals/Urinary Tract: The native kidneys are atrophic. There is a left lower quadrant renal transplant. There is mild hydronephrosis of the transplant kidney. Areas of parenchyma atrophy and cortical scarring predominantly involving the posterior transplant kidney likely due to areas of infarct. Clinical correlation recommended to evaluate for possibility of superimposed infection. No stone. There is a small peritransplant fluid collection relatively similar or slightly increased since the prior CT without significant mass effect. A small cyst in the inferior pole of the transplant kidney. The urinary bladder is grossly other. Stomach/Bowel: There is postsurgical changes  of the bowel with ileocolic anastomosis in the right upper abdomen. There is sigmoid diverticulosis. No bowel obstruction. Appendectomy. Vascular/Lymphatic: Mild aortoiliac atherosclerotic disease. The IVC is unremarkable. No portal venous gas. No  adenopathy. Reproductive: Hysterectomy. Other: Midline vertical anterior pelvic wall incisional scar and anterior abdominal wall open wound. Musculoskeletal: No acute osseous pathology. IMPRESSION: 1. Left lower quadrant renal transplant with mild hydronephrosis. Areas of parenchyma atrophy and cortical scarring predominantly involving the posterior transplant kidney likely due to areas of infarct. Clinical correlation recommended to evaluate for possibility of superimposed infection. 2. Small peritransplant fluid collection relatively similar or slightly increased since the prior CT without significant mass effect. 3. Postsurgical changes of the bowel with ileocolic anastomosis in the right upper abdomen. No bowel obstruction. 4. Sigmoid diverticulosis. 5.  Aortic Atherosclerosis (ICD10-I70.0). Electronically Signed   By: Vanetta Chou M.D.   On: 01/31/2024 19:34    Anti-infectives: Anti-infectives (From admission, onward)    Start     Dose/Rate Route Frequency Ordered Stop   01/24/24 1400  piperacillin -tazobactam (ZOSYN ) IVPB 3.375 g  Status:  Discontinued        3.375 g 12.5 mL/hr over 240 Minutes Intravenous Every 8 hours 01/24/24 0957 01/30/24 0740   01/22/24 1200  piperacillin -tazobactam (ZOSYN ) IVPB 2.25 g  Status:  Discontinued        2.25 g 100 mL/hr over 30 Minutes Intravenous Every 6 hours 01/22/24 9386 01/24/24 0957   01/21/24 2330  ceFEPIme  (MAXIPIME ) 2 g in sodium chloride  0.9 % 100 mL IVPB        2 g 200 mL/hr over 30 Minutes Intravenous  Once 01/21/24 2316 01/21/24 2356   01/21/24 2330  metroNIDAZOLE  (FLAGYL ) IVPB 500 mg        500 mg 100 mL/hr over 60 Minutes Intravenous  Once 01/21/24 2316 01/22/24 0109        Assessment/Plan  Hx of kidney-pancreas transplant 1999, followed at [facility]  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 Dasie  POD10 ex-lap, abdominal washout, ileocolic anastomosis, abdominal closure Dr. Aron - Continue soft diet, continue TPN pending calorie count - Has completed 7 days abx postop. WBC remains somewhat stably elevated but no other signs of infection, may be related to steroids.  -CT 11/26 negative for intra-abdominal complication, appreciate Dr. Patel/nephrology, ruling out pyelonephritis (UA reassuring) - BID WTD to midline wound - T1DM s/p explant of transplanted pancreas: blood sugars have been labile, on low dose Semglee  and sliding scale. CGM placed. - fiber supplement for diarrhea, C. difficile pending but no bowel movements recorded last 24 hours -Needs to mobilize - A bit more pain/distention this morning, monitor  FEN: soft, TPN (wean tomorrow if tolerating FLD), fiber supplement  VTE: SQH ID: Zosyn  11/17>11/25   - per TRH -  HTN HLD Asthma Hx of CVA    LOS: 11 days    Mitzie DELENA Freund, MD  [facility] 02/02/2024, 8:26 AM Please see Amion for pager number during day hours 7:00am-4:30pm

## 2024-02-02 NOTE — Progress Notes (Addendum)
 Progress Note   Patient: Sherri Todd FMW:[id number] DOB: [birth year] DOA: 01/21/2024     11 DOS: the patient was seen and examined on 02/02/2024   Brief hospital course: Mrs. Kopplin was admitted to the hospital with the working diagnosis of peritonitis and mesenteric volvulus.   63 yo female with the past medical history of kidney and pancreas transplantation in 1999, T1DM, hypertension and dyslipidemia who presented with abdominal pain, nausea and vomiting. She had worsening symptoms for the last 24 hrs prior to admission, because of severe pain she called EMS. She was found hypotensive, with systolic blood pressure in the 70's, she had 250 ml LR IV bolus and was transported to the ED. On the initial physical examination she continue to be hypotensive despite IV fluids, and was placed on vasopressors.  On admission blood pressure 102/53, HR 117, RR 26 and 02 saturation 97%  Lungs with no wheezing or rhonchi, heart with S1 and S2 present and tachycardic, abdomen with tenderness to palpation, distended but compressible, well healed lower surgical scar.   Na 135, K 3,8 Cl 100 bicarbonate 20, glucose 122, bun 31 cr 2,2 AST 36 ALT 15  Lactic acid 4.1 and 4.6  Wbc 16,3 hgb 13,8 plt 214  Urine analysis SG >1.030, protein > 300, small leukocytes, negative hgb. 21-50 rbc, 21-50 wbc   Chest radiograph with hypoinflation with no effusions or infiltrates, no effusions   EKG 106 bpm, normal axis, normal intervals qtc 476, sinus rhythm with no significant ST segment or T wave changes.   CT abdomen and pelvis with distal small bowel obstruction due to mesenteric volvulus. No free intraperitoneal gas. Trace ascites.  Transplant kidney in the left iliac fossa with small perinephric fluid collection, no significant mass effect of the transplanted kidney.   Patient underwent emergent laparotomy for small bowel obstruction secondary to mesenteric volvulus 1118 return 2 OR anastomosis closure 11/19  extubated 11/21 PICC line placed TPN started, transferred to [facility].  11/26 repeat CT abdomen and pelvis with no intra abdominal abscess. Positive small fluid around the transplanted kidney, mild hydronephrosis.    Assessment and Plan: * Small bowel ischemia Peritonitis with necrosis and mesenteric volvulus.  Septic shock has resolved. (Present on admission) Initial surgery on 11.17 for internal hernia with small bowel volvulus and necrosis of the terminal ileum and ascending colon.  11/18 Reopening of prior laparotomy, ileocolonic anastamosis at proximal transverse colon, suture repair upon closure of umbilical hernia   11/26 CT abdomen and pelvis left lower quadrant renal transplant with mild hydronephrosis. Areas of parenchymal atrophy and cortical scarring predominantly involving the posterior  transplant kidney likely due to areas of infarct.  Small peri transplant fluid collection relatively similar or slightly increased since prior imaging, without significant mass effect.  Postsurgical changes of the bowel with ileocolic anastomosis in the right upper abdomen. No bowel obstruction.   Follow up wbc is down to 17,8  Cultures have been with no growth. Repeat urine analysis with SG 1,010 with negative leukocytes, wbc 6-10   Recovering from post operative ileus.  Currently on TPN for nutritional support.  Tolerating well soft diet.  Out of bed.  Continue pain control with tramadol  as needed.  Continue with pantoprazole  to po.   AKI (acute kidney injury) Hyponatremia, hypo and hyperkalemia   Renal function today with serum cr at 0,75 with K at 4,4 and serum bicarbonate at 21  Na 135 Mg 1.9   Plan to continue TPN and follow up renal  function and electrolytes in am.  Continue oral mg oxide po   Type 1 diabetes mellitus (HCC) Will increased basal insulin  8 units and sliding scale.  Fasting glucose this am 246 mg/dl   Sp simultaneous renal and pancreas transplant in 1999  Continue  tacrolimus , mycophenolate  and prednisone   Added pancreatic enzymes.   Essential hypertension Continue blood pressure monitoring  History of CVA (cerebrovascular accident) Continue blood pressure monitoring   Malnutrition of moderate degree Continue nutritional support with TPN   Depression Positive anxiety  Plan to continue fluoxetine   No further confusion.  Continue supportive therapy         Subjective: Patient is sitting in the chair, she is feeling well, tolerating po well, no nausea or vomiting, no chest pain. Positive mild right sided abdominal pain.   Physical Exam: Vitals:   02/01/24 1914 02/01/24 2357 02/02/24 0440 02/02/24 0832  BP: 139/70 128/84 (!) 122/58 107/62  Pulse: 86 91  (!) 152  Resp: 20 20 20 18   Temp: 98.5 F (36.9 C) 98.4 F (36.9 C) 98.5 F (36.9 C) 99.1 F (37.3 C)  TempSrc: Oral Oral Oral Oral  SpO2: 100% 99% 96% 90%  Weight:      Height:       Neurology awake and alert. Deconditioned  ENT with mild pallor with no icterus Cardiovascular with S1 and S2 present and regular with no gallops, rubs or murmurs Respiratory with no rales or wheezing, no rhonchi  Abdomen with no distention, soft with mild tenderness to deep palpation on the right with no rebound or guarding No lower extremity edema   Data Reviewed:    Family Communication: I spoke with patient's husband at the bedside, we talked in detail about patient's condition, plan of care and prognosis and all questions were addressed.   Disposition: Status is: Inpatient Remains inpatient appropriate because: post op recovery   Planned Discharge Destination: Home      Author: Elidia Toribio Furnace, MD 02/02/2024 10:45 AM  For on call review [email or web address].

## 2024-02-02 NOTE — Progress Notes (Signed)
 PHARMACY - TOTAL PARENTERAL NUTRITION CONSULT NOTE   Indication: Prolonged ileus  Patient Measurements: Height: 5' 1 (154.9 cm) Weight: 45.3 kg (99 lb 13.9 oz) IBW/kg (Calculated) : 47.8 TPN AdjBW (KG): 51.6 Body mass index is 18.87 kg/m. 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 Patient was extubated 11/19. Patient is having bowel function but also nausea and vomiting with likely ileus. NGT placed to low-intermittent suction and patient made NPO. Pharmacy consulted for TPN.   Patient has a noted intolerance to soy as nausea - we will use SMOFlipids, which of our two lipid choices, has the least amount of soybean oil and monitor tolerance.   Glucose / Insulin : CBGs 183-255. Utilized 10 units of SSI and 6 units semglee  in last 24 hours. TPN contains 7 units of regular insulin . pred5  Patient has been intermittently refusing sliding scale insulin   Monitoring patient response closely in setting of removed transplanted pancreas. DM Coordinator added CGM monitor on 11/24.   Electrolytes: Na 135, K 4.4 (goal >/=4), Ch 106, Ca 8.3 [CoCa 9.82], Phos 3.9, Mg 1.9 (goal >/= 2) - MagOx added 11/27, previously refused IV Mg Renal: SCr  0.75, BUN 28 Hepatic: LFTs/Tbili/TG within normal limits. Albumin  2.1 Intake / Output; MIVF: UOP 1.3 mL/kg/hr + 2 occurrences, LBM 11/27 (type 7) GI Imaging: 11/17 KUB - dilated small bowel loops consistent with obstruction 11/17 CT Abd - distal small bowel obstruction d/t mesenteric  volvulus, no free gas, trace ascites 11/21 DG Abd decrease gaseous distention of small bowel loops 11/26 CT Abd no bowel obstruction GI Surgeries / Procedures:  11/17 Exlap resection of distal ileum and ascending colon, explant of transplanted pancreas, and ABThera VAC placement  11/18 abdominal washout, ileocolic anastomosis, and abdominal closure  Central access: 01/26/24 TPN start date: 01/26/24  Nutritional Goals: Goal TPN rate is 65 mL/hr (provides 83 g of protein and 1515 kcals per day)  RD Assessment: Estimated Needs Total Energy Estimated Needs: 1500-1700 kcals Total Protein Estimated Needs: 75-90 g Total Fluid Estimated Needs: 1.5L  Current Nutrition:  Soft diet and TPN *Will need pancreatic enzymes once taking enteral diet  Plan:  Continue TPN  65 mL/hr at 1800- providing ~90 grams of protein and 1502 Kcal meeting 100% of needs  TPN provides ~174.72 g dextrose  Electrolytes in TPN: Na 60 mEq/L, K 50 mEq/L, Ca 5 mEq/L, Mg 10 mEq/L, and Phos 20 mmol/L. Cl:Ac 1:1 Add standard MVI and trace elements to TPN Add Thiamine 100 mg to TPN - through 02/02/24 Continue very Sensitive q4h SSI, semglee  increased to 8 units daily per MD and DM Coordinator TPN regular insulin  to 7 units - DM Coordinator and MD aware Mg 1 g IV x1 Monitor TPN labs on Mon/Thurs, prn 48h calorie count in process  Thank you for involving pharmacy in this patient's care.  Delon Sax, PharmD, BCPS Clinical Pharmacist Clinical phone for 02/02/2024 is [phone] 02/02/2024 7:01 AM

## 2024-02-02 NOTE — Progress Notes (Signed)
 Calorie Count Note  48 hour calorie count ordered.  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   Goal: Patient will meet greater than or equal to 90% of their needs   Subjective: Patient awoke to sound of voice. Reports she has been eating well but admits she has been having to force herself to eat. Hates [name] and does not want to try any other supplements.  Intervention:  - 48 hour calorie count to continue through the end of tomorrow, 11/29.  - Please document % intake of all foods, drinks, and nutrition supplements patient consumes on meal tickets and place in envelope on patient's door.  - If patient skips/refuses meals please document 0% for that meal.  - Discussed with RN.  - Soft diet.  - Discontinue [name], patient reports she doesn't like it at all.    - Patient refusing to try any other supplements at this time.  - Start CREON TID with meals, dosing units per hospitalist/pharmacy.  - Continue TPN. - Management per pharmacy    Trude Ned RD, LDN Contact via Secure Chat.

## 2024-02-03 DIAGNOSIS — K559 Vascular disorder of intestine, unspecified: Secondary | ICD-10-CM | POA: Diagnosis not present

## 2024-02-03 DIAGNOSIS — N179 Acute kidney failure, unspecified: Secondary | ICD-10-CM | POA: Diagnosis not present

## 2024-02-03 DIAGNOSIS — I10 Essential (primary) hypertension: Secondary | ICD-10-CM | POA: Diagnosis not present

## 2024-02-03 DIAGNOSIS — E1069 Type 1 diabetes mellitus with other specified complication: Secondary | ICD-10-CM | POA: Diagnosis not present

## 2024-02-03 LAB — CBC WITH DIFFERENTIAL/PLATELET
Abs Immature Granulocytes: 0.11 K/uL — ABNORMAL HIGH (ref 0.00–0.07)
Basophils Absolute: 0 K/uL (ref 0.0–0.1)
Basophils Relative: 0 %
Eosinophils Absolute: 0.2 K/uL (ref 0.0–0.5)
Eosinophils Relative: 1 %
HCT: 24.2 % — ABNORMAL LOW (ref 36.0–46.0)
Hemoglobin: 7.7 g/dL — ABNORMAL LOW (ref 12.0–15.0)
Immature Granulocytes: 1 %
Lymphocytes Relative: 8 %
Lymphs Abs: 1.1 K/uL (ref 0.7–4.0)
MCH: 33.8 pg (ref 26.0–34.0)
MCHC: 31.8 g/dL (ref 30.0–36.0)
MCV: 106.1 fL — ABNORMAL HIGH (ref 80.0–100.0)
Monocytes Absolute: 1.6 K/uL — ABNORMAL HIGH (ref 0.1–1.0)
Monocytes Relative: 12 %
Neutro Abs: 11 K/uL — ABNORMAL HIGH (ref 1.7–7.7)
Neutrophils Relative %: 78 %
Platelets: 308 K/uL (ref 150–400)
RBC: 2.28 MIL/uL — ABNORMAL LOW (ref 3.87–5.11)
RDW: 15.9 % — ABNORMAL HIGH (ref 11.5–15.5)
WBC: 14 K/uL — ABNORMAL HIGH (ref 4.0–10.5)
nRBC: 0 % (ref 0.0–0.2)

## 2024-02-03 LAB — GLUCOSE, CAPILLARY
Glucose-Capillary: 162 mg/dL — ABNORMAL HIGH (ref 70–99)
Glucose-Capillary: 173 mg/dL — ABNORMAL HIGH (ref 70–99)
Glucose-Capillary: 187 mg/dL — ABNORMAL HIGH (ref 70–99)
Glucose-Capillary: 191 mg/dL — ABNORMAL HIGH (ref 70–99)
Glucose-Capillary: 225 mg/dL — ABNORMAL HIGH (ref 70–99)
Glucose-Capillary: 34 mg/dL — CL (ref 70–99)
Glucose-Capillary: 87 mg/dL (ref 70–99)
Glucose-Capillary: 97 mg/dL (ref 70–99)

## 2024-02-03 LAB — BASIC METABOLIC PANEL WITH GFR
Anion gap: 8 (ref 5–15)
BUN: 34 mg/dL — ABNORMAL HIGH (ref 8–23)
CO2: 22 mmol/L (ref 22–32)
Calcium: 8.5 mg/dL — ABNORMAL LOW (ref 8.9–10.3)
Chloride: 106 mmol/L (ref 98–111)
Creatinine, Ser: 0.87 mg/dL (ref 0.44–1.00)
GFR, Estimated: >60 mL/min (ref >60)
Glucose, Bld: 160 mg/dL — ABNORMAL HIGH (ref 70–99)
Potassium: 4.2 mmol/L (ref 3.5–5.1)
Sodium: 136 mmol/L (ref 135–145)

## 2024-02-03 LAB — URINE CULTURE: Culture: <10000 — AB

## 2024-02-03 LAB — MAGNESIUM: Magnesium: 2 mg/dL (ref 1.7–2.4)

## 2024-02-03 LAB — TACROLIMUS LEVEL: Tacrolimus (FK506) - LabCorp: 2.5 ng/mL — ABNORMAL LOW (ref 5.0–20.0)

## 2024-02-03 MED ORDER — ORAL CARE MOUTH RINSE: 15.0000 mL | OROMUCOSAL | Status: DC | PRN

## 2024-02-03 MED ORDER — TRAVASOL 10 % IV SOLN
INTRAVENOUS | Status: AC
  Filled 2024-02-03: qty 487.2

## 2024-02-03 MED ORDER — HYDROMORPHONE HCL 1 MG/ML IJ SOLN: 0.5000 mg | Freq: Three times a day (TID) | INTRAMUSCULAR | Status: DC | PRN

## 2024-02-03 MED ORDER — TRAVASOL 10 % IV SOLN
INTRAVENOUS | Status: DC
  Filled 2024-02-03: qty 904.8

## 2024-02-03 NOTE — Progress Notes (Signed)
 Pt called this nurse into the room and said said she felt like her blood sugar was low, checked it was 34. Pt sweaty 1/2 amp of D50 given per order. Recheck was 87 25 grams of carb's( pudding) given to patient

## 2024-02-03 NOTE — Progress Notes (Signed)
 Progress Note  11 Days Post-Op  Subjective: She is feeling quite a bit better today.  No significant pain or nausea.  Bowel movement frequency has improved with addition of Creon yesterday  Objective: Vital signs in last 24 hours: Temp:  [97.9 F (36.6 C)-98.4 F (36.9 C)] 98.2 F (36.8 C) (11/29 0815) Pulse Rate:  [77-86] 77 (11/29 0815) Resp:  [17-26] 26 (11/29 0900) BP: (114-143)/(47-75) 128/64 (11/29 0815) SpO2:  [96 %-100 %] 98 % (11/29 0815) Weight:  [46.5 kg] 46.5 kg (11/29 0620) Last BM Date : 02/02/24  Intake/Output from previous day: 11/28 0701 - 11/29 0700 In: 925.5 [P.O.:120; I.V.:805.5] Out: 1350 [Urine:1350] Intake/Output this shift: Total I/O In: 140 [I.V.:140] Out: -   PE: General: resting in bed, NAD Heart: RRR Lungs: nonlabored respirations Abd: soft, nontender, nondistended. Midline wound is open at the skin, wound bed is clean and dry with granulation tissue, fascia intact.  Surrounding skin maceration from damp dressings   Lab Results:  Recent Labs    02/02/24 0441 02/03/24 0500  WBC 17.8* 14.0*  HGB 8.0* 7.7*  HCT 24.3* 24.2*  PLT 260 308   BMET Recent Labs    02/01/24 0624 02/03/24 0500  NA 135 136  K 4.4 4.2  CL 106 106  CO2 21* 22  GLUCOSE 168* 160*  BUN 25* 34*  CREATININE 0.75 0.87  CALCIUM  8.3* 8.5*   PT/INR No results for input(s): LABPROT, INR in the last 72 hours. CMP     Component Value Date/Time   NA 136 02/03/2024 0500   K 4.2 02/03/2024 0500   CL 106 02/03/2024 0500   CO2 22 02/03/2024 0500   GLUCOSE 160 (H) 02/03/2024 0500   BUN 34 (H) 02/03/2024 0500   CREATININE 0.87 02/03/2024 0500   CALCIUM  8.5 (L) 02/03/2024 0500   PROT 4.8 (L) 02/01/2024 0624   ALBUMIN  2.1 (L) 02/01/2024 0624   AST 19 02/01/2024 0624   ALT 25 02/01/2024 0624   ALKPHOS 55 02/01/2024 0624   BILITOT 0.3 02/01/2024 0624   GFRNONAA >60 02/03/2024 0500   Lipase  No results found for: LIPASE     Studies/Results: No  results found.   Anti-infectives: Anti-infectives (From admission, onward)    Start     Dose/Rate Route Frequency Ordered Stop   01/24/24 1400  piperacillin -tazobactam (ZOSYN ) IVPB 3.375 g  Status:  Discontinued        3.375 g 12.5 mL/hr over 240 Minutes Intravenous Every 8 hours 01/24/24 0957 01/30/24 0740   01/22/24 1200  piperacillin -tazobactam (ZOSYN ) IVPB 2.25 g  Status:  Discontinued        2.25 g 100 mL/hr over 30 Minutes Intravenous Every 6 hours 01/22/24 9386 01/24/24 0957   01/21/24 2330  ceFEPIme  (MAXIPIME ) 2 g in sodium chloride  0.9 % 100 mL IVPB        2 g 200 mL/hr over 30 Minutes Intravenous  Once 01/21/24 2316 01/21/24 2356   01/21/24 2330  metroNIDAZOLE  (FLAGYL ) IVPB 500 mg        500 mg 100 mL/hr over 60 Minutes Intravenous  Once 01/21/24 2316 01/22/24 0109        Assessment/Plan  Hx of kidney-pancreas transplant 1999, followed at [facility]  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 Dasie  POD11 ex-lap, abdominal washout, ileocolic anastomosis, abdominal closure Dr. Aron - Continue soft  diet, will 1/2 rate TPN today - Has completed 7 days abx postop. WBC remains elevated at 14 but decreasing, no other signs of infection, may be related to steroids.  -CT 11/26 negative for intra-abdominal complication, appreciate Dr. Patel/nephrology, ruling out pyelonephritis (UA reassuring) - BID WTD to midline wound - T1DM s/p explant of transplanted pancreas: blood sugars have been labile, on low dose Semglee  and sliding scale. CGM placed. - fiber supplement for diarrhea, C. difficile pending but diarrhea is actually better with Creon  addition -Needs to mobilize   FEN: soft, TPN (decrease to half rate today), fiber supplement  VTE: SQH ID: Zosyn  11/17>11/25   - per TRH -  HTN HLD Asthma Hx of  CVA    LOS: 12 days    Mitzie DELENA Freund, MD  [facility] Surgery 02/03/2024, 10:03 AM Please see Amion for pager number during day hours 7:00am-4:30pm

## 2024-02-03 NOTE — Progress Notes (Addendum)
 Progress Note   Patient: Sherri Todd FMW:[id number] DOB: 05-01-1960 DOA: 01/21/2024     12 DOS: the patient was seen and examined on 02/03/2024   Brief hospital course: [name] was admitted to the hospital with the working diagnosis of peritonitis and mesenteric volvulus.   63 yo female with the past medical history of kidney and pancreas transplantation in 1999, T1DM, hypertension and dyslipidemia who presented with abdominal pain, nausea and vomiting. She had worsening symptoms for the last 24 hrs prior to admission, because of severe pain she called EMS. She was found hypotensive, with systolic blood pressure in the 70's, she had 250 ml LR IV bolus and was transported to the ED. On the initial physical examination she continue to be hypotensive despite IV fluids, and was placed on vasopressors.  On admission blood pressure 102/53, HR 117, RR 26 and 02 saturation 97%  Lungs with no wheezing or rhonchi, heart with S1 and S2 present and tachycardic, abdomen with tenderness to palpation, distended but compressible, well healed lower surgical scar.   Na 135, K 3,8 Cl 100 bicarbonate 20, glucose 122, bun 31 cr 2,2 AST 36 ALT 15  Lactic acid 4.1 and 4.6  Wbc 16,3 hgb 13,8 plt 214  Urine analysis SG >1.030, protein > 300, small leukocytes, negative hgb. 21-50 rbc, 21-50 wbc   Chest radiograph with hypoinflation with no effusions or infiltrates, no effusions   EKG 106 bpm, normal axis, normal intervals qtc 476, sinus rhythm with no significant ST segment or T wave changes.   CT abdomen and pelvis with distal small bowel obstruction due to mesenteric volvulus. No free intraperitoneal gas. Trace ascites.  Transplant kidney in the left iliac fossa with small perinephric fluid collection, no significant mass effect of the transplanted kidney.   Patient underwent emergent laparotomy for small bowel obstruction secondary to mesenteric volvulus 1118 return 2 OR anastomosis closure 11/19  extubated 11/21 PICC line placed TPN started, transferred to [facility].  11/26 repeat CT abdomen and pelvis with no intra abdominal abscess. Positive small fluid around the transplanted kidney, mild hydronephrosis.  11/29 tolerating po well, starting to wean off TPN, reduced by 50% today.   Assessment and Plan: * Small bowel ischemia Peritonitis with necrosis and mesenteric volvulus.  Septic shock has resolved. (Present on admission) Initial surgery on 11.17 for internal hernia with small bowel volvulus and necrosis of the terminal ileum and ascending colon.  11/18 Reopening of prior laparotomy, ileocolonic anastamosis at proximal transverse colon, suture repair upon closure of umbilical hernia   11/26 CT abdomen and pelvis left lower quadrant renal transplant with mild hydronephrosis. Areas of parenchymal atrophy and cortical scarring predominantly involving the posterior  transplant kidney likely due to areas of infarct.  Small peri transplant fluid collection relatively similar or slightly increased since prior imaging, without significant mass effect.  Postsurgical changes of the bowel with ileocolic anastomosis in the right upper abdomen. No bowel obstruction.   Cultures have been with no growth. Repeat urine analysis with SG 1,010 with negative leukocytes, wbc 6-10  Wbc continue to trend down, today is 14.0   Recovering from post operative ileus.  Decreasing TPN by 50%, patient is tolerating well soft diet.    Continue pain control with tramadol  as needed. Discontinue oxycodone  and hydromorphone .  Discontinue IV diphenhydramine    Continue with pantoprazole  to po.  Out of bed and continue mobilization  Ok to discontinue telemetry monitoring \  Post operative anemia, with follow up hgb at 7.7  Will continue close monitoring cell count. Check iron stores.   AKI (acute kidney injury) Hyponatremia, hypo and hyperkalemia   Renal function stable with serum cr at 0,87 with K at 4.2 and  serum bicarbonate at 22 Na 136   Continue to wean off TPN.   Type 1 diabetes mellitus (HCC) Continue insulin  sliding scale and basal.  Fasting glucose this am 160 mg/dl   Sp simultaneous renal and pancreas transplant in 1999  Continue tacrolimus , mycophenolate  and prednisone   Doing well with pancreatic enzymes.   Essential hypertension Continue blood pressure monitoring  History of CVA (cerebrovascular accident) Continue blood pressure monitoring   Malnutrition of moderate degree Tolerating po well, weaning off TPN  Depression Positive anxiety  Plan to continue fluoxetine   No further confusion.  Continue supportive therapy       Subjective: Patient with no chest pain and no dyspnea, abdominal pain has been improving, had positive bowel movement but not frank watery diarrhea.   Physical Exam: Vitals:   02/03/24 1142 02/03/24 1200 02/03/24 1300 02/03/24 1317  BP: (!) 122/58 (!) 121/56 (!) 110/59   Pulse: 83 89 82 82  Resp: 19 (!) 24 (!) 28 (!) 27  Temp: 98.2 F (36.8 C)     TempSrc: Oral     SpO2: 100% 98% 97% 98%  Weight:      Height:       Neurology awake and alert, deconditioned ENT with mild pallor with no icterus Cardiovascular with S1 and S2 present and regular with no gallops, rubs or murmurs Respiratory with no rales or wheezing, no rhonchi  Abdomen with no distention, mild tender to palpation, surgical wound with dressing in place No lower extremity edema  Data Reviewed:    Family Communication: no family at the bedside   Disposition: Status is: Inpatient Remains inpatient appropriate because: recovering peritonitis   Planned Discharge Destination: Home    Author: Elidia Toribio Furnace, MD 02/03/2024 2:04 PM  For on call review [email or web address].

## 2024-02-03 NOTE — Inpatient Diabetes Management (Signed)
 Inpatient Diabetes Program Recommendations  AACE/ADA: New Consensus Statement on Inpatient Glycemic Control (2015)  Target Ranges:  Prepandial:   less than 140 mg/dL      Peak postprandial:   less than 180 mg/dL (1-2 hours)      Critically ill patients:  140 - 180 mg/dL   Lab Results  Component Value Date   GLUCAP 173 (H) 02/03/2024   HGBA1C 5.0 01/22/2024    Review of Glycemic Control  Diabetes history: DM 1 (transplanted pancreas removed) Outpatient Diabetes medications:  None Current orders for Inpatient glycemic control:  Novolog  0-6 units q 4 hours Semglee  8 units daily TPN-(7 units/liter)  Inpatient Diabetes Program Recommendations:    Consider increasing Semglee  10 units every day.  Thanks, Clotilda Bull RN, MSN, BC-ADM Inpatient Diabetes Coordinator Team Pager [phone] (8a-5p)

## 2024-02-03 NOTE — Progress Notes (Signed)
 Patient ID: Sherri Todd, female   DOB: 1961/02/05, 63 y.o.   MRN: [id number] [facility] Progress Note   Assessment/ Plan:   1. Acute kidney Injury: Renal function back to baseline and she remains nonoliguric after recovery from initial oliguric acute kidney injury in the setting of shock/mild ATN.  No acute electrolyte abnormalities. 2.  History of simultaneous kidney/pancreas transplantation in 1999: Now back on oral tacrolimus , MMF and prednisone  (after previously holding MMF and conversion to SL tacrolimus  and Solu-Medrol ).  Tacrolimus  trough level marginally depressed, will recheck today. 3.  Ischemic bowel: Postoperative day #12 after exploratory laparotomy and resection of distal ileum/ascending colon and explant of transplanted pancreas for the management of internal hernia/small bowel volvulus with necrosis of terminal ileum and ascending colon.  Tolerating clear liquids and with plan to wean TPN based on calorie count.  No further diarrhea/abdominal pain. 4.  Septic shock: Appears to have improved/resolved status post management of ischemic bowel with surgical intervention.  Previously on Zosyn  that was discontinued after completion of 7 days of antibiotics. 5.  Encephalopathy: Currently suspected to be from hospital delirium.  CT scan of the abdomen/pelvis without any focal infection and urinalysis without distinct evidence of UTI (urine culture from 11/27 is pending).  Subjective:   Denies any significant abdominal pain or nausea overnight.  Taken off of enteric precautions after resolution of diarrhea.  Objective:   BP (!) 116/58 (BP Location: Left Arm)   Pulse 79   Temp 97.9 F (36.6 C) (Oral)   Resp 20   Ht 5' 1 (1.549 m)   Wt 46.5 kg   SpO2 96%   BMI 19.37 kg/m   Intake/Output Summary (Last 24 hours) at 02/03/2024 0816 Last data filed at 02/03/2024 0620 Gross per 24 hour  Intake 669.51 ml  Output 1350 ml  Net -680.49 ml   Weight change:    Physical Exam: Gen: Comfortably resting in bed, NT at bedside CVS: Pulse regular rhythm, normal rate, S1 and S2 normal Resp: Diminished breath sounds over bases, poor inspiratory effort, no rales/rhonchi Abd: Intact abdominal midline dressing, bowel sounds scant Ext: No lower extremity edema  Imaging: No results found.   Labs: BMET Recent Labs  Lab 01/27/24 1917 01/28/24 0223 01/28/24 1755 01/29/24 0510 01/30/24 0330 01/31/24 0442 02/01/24 0624 02/03/24 0500  NA 141 142 139 136 134* 133* 135 136  K 3.5 3.4* 5.4* 5.0 5.1 4.2 4.4 4.2  CL 104 105 105 102 103 104 106 106  CO2 31 27 21* 24 21* 21* 21* 22  GLUCOSE 75 116* 261* 300* 341* 122* 168* 160*  BUN 23 23 26* 25* 28* 28* 25* 34*  CREATININE 0.82 0.83 0.97 0.90 0.91 0.84 0.75 0.87  CALCIUM  8.6* 8.4* 8.4* 8.3* 8.7* 8.3* 8.3* 8.5*  PHOS 2.7 2.9 3.9 3.6 3.8  --  3.9  --    CBC Recent Labs  Lab 01/31/24 0442 02/01/24 0624 02/02/24 0441 02/03/24 0500  WBC 21.0* 18.0* 17.8* 14.0*  NEUTROABS 17.4* 15.0* 14.8* 11.0*  HGB 8.1* 8.0* 8.0* 7.7*  HCT 24.7* 24.9* 24.3* 24.2*  MCV 104.7* 106.0* 106.1* 106.1*  PLT 220 247 260 308    Medications:     acetaminophen   650 mg Oral Q6H   Chlorhexidine  Gluconate Cloth  6 each Topical Daily   enoxaparin  (LOVENOX ) injection  30 mg Subcutaneous Q24H   feeding supplement ([employer] STANDARD ENT 1.4)  325 mL Oral BID BM   FLUoxetine   20 mg Oral Daily  insulin  aspart  0-6 Units Subcutaneous Q4H   insulin  glargine-yfgn  8 Units Subcutaneous Daily   lipase/protease/amylase  12,000 Units Oral TID WC   magnesium  oxide  400 mg Oral BID   mycophenolate   500 mg Oral BID   pantoprazole   40 mg Oral Daily   polycarbophil  625 mg Oral Daily   predniSONE   5 mg Oral Q breakfast   sodium chloride  flush  10-40 mL Intracatheter Q12H   tacrolimus   1 mg Oral BID    Gordy Blanch, MD 02/03/2024, 8:16 AM

## 2024-02-03 NOTE — Progress Notes (Addendum)
 PHARMACY - TOTAL PARENTERAL NUTRITION CONSULT NOTE   Indication: Prolonged ileus  Patient Measurements: Height: 5' 1 (154.9 cm) Weight: 46.5 kg (102 lb 8.2 oz) IBW/kg (Calculated) : 47.8 TPN AdjBW (KG): 51.6 Body mass index is 19.37 kg/m. Usual Weight: 49 kg  Assessment: 63 years of  age female with history of kidney and pancreas transplant 12/1997 followed at [facility] admitted for abdominal pain, nausea, and hypotension found to have internal hernia with small bowel volvulus and necrosis of terminal ileum and ascending colon requiring emergent laparotomy for resection of distal ileum and ascending colon, explant of transplanted pancreas, and ABThera VAC placement on 11/17 and returned for abdominal washout, ileocolic anastomosis, and abdominal closure 11/18. Patient was extubated 11/19. Patient is having bowel function but also nausea and vomiting with likely ileus. NGT placed to low-intermittent suction and patient made NPO. Pharmacy consulted for TPN.   Patient has a noted intolerance to soy as nausea - we will use SMOFlipids, which of our two lipid choices, has the least amount of soybean oil and monitor tolerance.   Glucose / Insulin : CBGs 162-336. Utilized 14 units of SSI and 8 units semglee  in last 24 hours. TPN contains 7 units of regular insulin . pred5  Monitoring patient response closely in setting of removed transplanted pancreas. DM Coordinator added CGM monitor on 11/24.   Electrolytes: Na 136, K 4.2 (goal >/=4), Ch 106, Ca 8.5 [CoCa 10], Phos 3.9, Mg 2 (goal >/= 2) - MagOx added 11/27 + s/p 1g, previously refused IV Mg Renal: SCr  0.87, BUN 34 Hepatic: LFTs/Tbili/TG within normal limits. Albumin  2.1 Intake / Output; MIVF: UOP 1.2 mL/kg/hr + 4 occurrences, LBM 11/28 (type 7) GI Imaging: 11/17 KUB - dilated small bowel loops consistent with obstruction 11/17 CT Abd - distal small bowel obstruction d/t mesenteric volvulus, no free gas, trace ascites 11/21 DG Abd decrease gaseous  distention of small bowel loops 11/26 CT Abd no bowel obstruction GI Surgeries / Procedures:  11/17 Exlap resection of distal ileum and ascending colon, explant of transplanted pancreas, and ABThera VAC placement  11/18 abdominal washout, ileocolic anastomosis, and abdominal closure  Central access: 01/26/24 TPN start date: 01/26/24  Nutritional Goals: Goal TPN rate is 65 mL/hr (provides 83 g of protein and 1515 kcals per day)  RD Assessment: Estimated Needs Total Energy Estimated Needs: 1500-1700 kcals Total Protein Estimated Needs: 75-90 g Total Fluid Estimated Needs: 1.5L  Current Nutrition:  Soft diet and TPN *Pancreatic enzymes started 11/28 11/28 day 1 calorie count: 44 g protein (59% needs), 586 kcal (39% needs); declining [name] 11/29 day 2 calorie count: pending  Plan:  Continue TPN  65 mL/hr at 1800- providing ~90 grams of protein and 1502 Kcal meeting 100% of needs  TPN provides ~174.72 g dextrose  Electrolytes in TPN: Na 60 mEq/L, K 50 mEq/L, Ca 5 mEq/L, Mg 10 mEq/L, and Phos 20 mmol/L. Cl:Ac 1:1 Add standard MVI and trace elements to TPN S/p thiamine Continue very Sensitive q4h SSI, semglee  increased to 8 units daily per MD and DM Coordinator TPN regular insulin  to 7 units - DM Coordinator and MD aware Monitor TPN labs on Mon/Thurs, prn 48h calorie count in process  Thank you for involving pharmacy in this patient's care.  Delon Sax, PharmD, BCPS Clinical Pharmacist Clinical phone for 02/03/2024 is [phone] 02/03/2024 7:13 AM  Addendum: Reduce to 1/2 TPN today per Surgery.  Delon Sax, PharmD, BCPS 12:55 PM

## 2024-02-04 DIAGNOSIS — K559 Vascular disorder of intestine, unspecified: Secondary | ICD-10-CM | POA: Diagnosis not present

## 2024-02-04 DIAGNOSIS — E1069 Type 1 diabetes mellitus with other specified complication: Secondary | ICD-10-CM | POA: Diagnosis not present

## 2024-02-04 DIAGNOSIS — N179 Acute kidney failure, unspecified: Secondary | ICD-10-CM | POA: Diagnosis not present

## 2024-02-04 DIAGNOSIS — I10 Essential (primary) hypertension: Secondary | ICD-10-CM | POA: Diagnosis not present

## 2024-02-04 LAB — GLUCOSE, CAPILLARY
Glucose-Capillary: 157 mg/dL — ABNORMAL HIGH (ref 70–99)
Glucose-Capillary: 323 mg/dL — ABNORMAL HIGH (ref 70–99)
Glucose-Capillary: 232 mg/dL — ABNORMAL HIGH (ref 70–99)
Glucose-Capillary: 238 mg/dL — ABNORMAL HIGH (ref 70–99)
Glucose-Capillary: 214 mg/dL — ABNORMAL HIGH (ref 70–99)

## 2024-02-04 LAB — CBC WITH DIFFERENTIAL/PLATELET
Abs Immature Granulocytes: 0.11 K/uL — ABNORMAL HIGH (ref 0.00–0.07)
Basophils Absolute: 0.1 K/uL (ref 0.0–0.1)
Basophils Relative: 0 %
Eosinophils Absolute: 0.1 K/uL (ref 0.0–0.5)
Eosinophils Relative: 1 %
HCT: 25.3 % — ABNORMAL LOW (ref 36.0–46.0)
Hemoglobin: 7.9 g/dL — ABNORMAL LOW (ref 12.0–15.0)
Immature Granulocytes: 1 %
Lymphocytes Relative: 8 %
Lymphs Abs: 1.3 K/uL (ref 0.7–4.0)
MCH: 33.1 pg (ref 26.0–34.0)
MCHC: 31.2 g/dL (ref 30.0–36.0)
MCV: 105.9 fL — ABNORMAL HIGH (ref 80.0–100.0)
Monocytes Absolute: 1.8 K/uL — ABNORMAL HIGH (ref 0.1–1.0)
Monocytes Relative: 12 %
Neutro Abs: 11.7 K/uL — ABNORMAL HIGH (ref 1.7–7.7)
Neutrophils Relative %: 78 %
Platelets: 348 K/uL (ref 150–400)
RBC: 2.39 MIL/uL — ABNORMAL LOW (ref 3.87–5.11)
RDW: 15.9 % — ABNORMAL HIGH (ref 11.5–15.5)
WBC: 15 K/uL — ABNORMAL HIGH (ref 4.0–10.5)
nRBC: 0 % (ref 0.0–0.2)

## 2024-02-04 LAB — FERRITIN: Ferritin: 66 ng/mL (ref 11–307)

## 2024-02-04 LAB — IRON AND TIBC
Iron: 16 ug/dL — ABNORMAL LOW (ref 28–170)
Saturation Ratios: 6 % — ABNORMAL LOW (ref 10.4–31.8)
TIBC: 252 ug/dL (ref 250–450)
UIBC: 236 ug/dL

## 2024-02-04 LAB — TRANSFERRIN: Transferrin: 186 mg/dL — ABNORMAL LOW (ref 192–382)

## 2024-02-04 MED ORDER — INSULIN GLARGINE-YFGN 100 UNIT/ML ~~LOC~~ SOPN
4.0000 [IU] | PEN_INJECTOR | SUBCUTANEOUS | Status: DC
  Filled 2024-02-04: qty 3

## 2024-02-04 MED ORDER — INSULIN GLARGINE-YFGN 100 UNIT/ML ~~LOC~~ SOLN
4.0000 [IU] | Freq: Every day | SUBCUTANEOUS | Status: DC
  Administered 2024-02-04: 4 [IU] via SUBCUTANEOUS
  Filled 2024-02-04 (×2): qty 0.04

## 2024-02-04 MED ORDER — INSULIN GLARGINE-YFGN 100 UNIT/ML ~~LOC~~ SOLN: 4.0000 [IU] | Freq: Every day | SUBCUTANEOUS | Status: DC

## 2024-02-04 MED ORDER — ADULT MULTIVITAMIN W/MINERALS CH
1.0000 | ORAL_TABLET | Freq: Every day | ORAL | Status: DC
  Administered 2024-02-04 – 2024-02-06 (×3): 1 via ORAL
  Filled 2024-02-04 (×3): qty 1

## 2024-02-04 MED ORDER — TRAMADOL HCL 50 MG PO TABS: 50.0000 mg | ORAL_TABLET | Freq: Four times a day (QID) | ORAL | Status: DC | PRN

## 2024-02-04 MED ORDER — ACETAMINOPHEN 325 MG PO TABS
650.0000 mg | ORAL_TABLET | Freq: Four times a day (QID) | ORAL | Status: DC | PRN
  Administered 2024-02-04: 650 mg via ORAL

## 2024-02-04 MED ORDER — FERROUS SULFATE 325 (65 FE) MG PO TABS
325.0000 mg | ORAL_TABLET | Freq: Every day | ORAL | Status: DC
  Administered 2024-02-05 – 2024-02-06 (×2): 325 mg via ORAL
  Filled 2024-02-04 (×2): qty 1

## 2024-02-04 NOTE — Progress Notes (Signed)
 Progress Note   Patient: Sherri Todd FMW:[id number] DOB: 1960-12-01 DOA: 01/21/2024     13 DOS: the patient was seen and examined on 02/04/2024   Brief hospital course: [name] was admitted to the hospital with the working diagnosis of peritonitis and mesenteric volvulus.   63 yo female with the past medical history of kidney and pancreas transplantation in 1999, T1DM, hypertension and dyslipidemia who presented with abdominal pain, nausea and vomiting. She had worsening symptoms for the last 24 hrs prior to admission, because of severe pain she called EMS. She was found hypotensive, with systolic blood pressure in the 70's, she had 250 ml LR IV bolus and was transported to the ED. On the initial physical examination she continue to be hypotensive despite IV fluids, and was placed on vasopressors.  On admission blood pressure 102/53, HR 117, RR 26 and 02 saturation 97%  Lungs with no wheezing or rhonchi, heart with S1 and S2 present and tachycardic, abdomen with tenderness to palpation, distended but compressible, well healed lower surgical scar.   Na 135, K 3,8 Cl 100 bicarbonate 20, glucose 122, bun 31 cr 2,2 AST 36 ALT 15  Lactic acid 4.1 and 4.6  Wbc 16,3 hgb 13,8 plt 214  Urine analysis SG >1.030, protein > 300, small leukocytes, negative hgb. 21-50 rbc, 21-50 wbc   Chest radiograph with hypoinflation with no effusions or infiltrates, no effusions   EKG 106 bpm, normal axis, normal intervals qtc 476, sinus rhythm with no significant ST segment or T wave changes.   CT abdomen and pelvis with distal small bowel obstruction due to mesenteric volvulus. No free intraperitoneal gas. Trace ascites.  Transplant kidney in the left iliac fossa with small perinephric fluid collection, no significant mass effect of the transplanted kidney.   Patient underwent emergent laparotomy for small bowel obstruction secondary to mesenteric volvulus 1118 return 2 OR anastomosis closure 11/19  extubated 11/21 PICC line placed TPN started, transferred to [facility].  11/26 repeat CT abdomen and pelvis with no intra abdominal abscess. Positive small fluid around the transplanted kidney, mild hydronephrosis.  11/29 tolerating po well, starting to wean off TPN, reduced by 50% today.  11/30 stop TPN.   Assessment and Plan: * Small bowel ischemia Peritonitis with necrosis and mesenteric volvulus.  Septic shock has resolved. (Present on admission) Initial surgery on 11.17 for internal hernia with small bowel volvulus and necrosis of the terminal ileum and ascending colon.  11/18 Reopening of prior laparotomy, ileocolonic anastamosis at proximal transverse colon, suture repair upon closure of umbilical hernia   11/26 CT abdomen and pelvis left lower quadrant renal transplant with mild hydronephrosis. Areas of parenchymal atrophy and cortical scarring predominantly involving the posterior  transplant kidney likely due to areas of infarct.  Small peri transplant fluid collection relatively similar or slightly increased since prior imaging, without significant mass effect.  Postsurgical changes of the bowel with ileocolic anastomosis in the right upper abdomen. No bowel obstruction.   Cultures have been with no growth. Repeat urine analysis with SG 1,010 with negative leukocytes, wbc 6-10  Wbc continue to trend down, today is 15.0   Recovering from post operative ileus.  Tolerating po well, plan to stop TPN today.    Continue pain control with tramadol  as needed.  Continue with pantoprazole  to po.  Out of bed and continue mobilization   Post operative anemia, with follow up hgb at 7.9  Will continue close monitoring cell count. Positive iron deficiency with serum iron  at 16, with TIBC 252 and transferrin saturation at 6, with ferritin 186.  Some component of anemia of chronic disease.   Patient having multiple iron allergies, will check with pharmacy for iron supplementation   AKI (acute  kidney injury) Hyponatremia, hypo and hyperkalemia   Patient tolerating po well, follow up renal function tomorrow.   Type 1 diabetes mellitus (HCC) Positive hypoglycemia, to 34 mg/dl at 77:91 hrs Fasting capillary glucose 157 this am.  Plan to continue insulin  sliding scale, and will reduce basal insulin  by 50% down to 4 units.   Sp simultaneous renal and pancreas transplant in 1999  Continue tacrolimus , mycophenolate  and prednisone   Doing well with pancreatic enzymes.   Essential hypertension Continue blood pressure monitoring  History of CVA (cerebrovascular accident) Continue blood pressure monitoring   Malnutrition of moderate degree Tolerating po well, stopping TPN today  Depression Positive anxiety  Plan to continue fluoxetine   No further confusion.  Continue supportive therapy         Subjective: patient is feeling better, abdominal pain has improved, has no chest pain or dyspnea, positive watery bowel movement twice per day.   Physical Exam: Vitals:   02/03/24 1930 02/03/24 2340 02/04/24 0249 02/04/24 0814  BP: (!) 119/55 (!) 115/53 138/66 134/67  Pulse: 86 93 76 87  Resp: 18 17 16 18   Temp: 98.6 F (37 C) 99 F (37.2 C) 98.1 F (36.7 C) 98.3 F (36.8 C)  TempSrc: Oral Oral Oral Oral  SpO2: 100% 100% 92% 99%  Weight:   46.1 kg   Height:       Neurology awake and alert ENT with mild pallor Cardiovascular with S1 and S2 present and regular with no gallops, rubs or murmurs Respiratory with no rales or wheezing, no rhonchi Abdomen soft and not distended, not tender to superficial palpation, no rebound or guarding  No lower extremity edema Data Reviewed:    Family Communication: I spoke with patient's husband at the bedside, we talked in detail about patient's condition, plan of care and prognosis and all questions were addressed.   Disposition: Status is: Inpatient Remains inpatient appropriate because: recovering peritonitis   Planned  Discharge Destination: Home     Author: Elidia Toribio Furnace, MD 02/04/2024 10:48 AM  For on call review [email or web address].

## 2024-02-04 NOTE — Progress Notes (Signed)
 PHARMACY - TOTAL PARENTERAL NUTRITION CONSULT NOTE   Indication: Prolonged ileus  Patient Measurements: Height: 5' 1 (154.9 cm) Weight: 46.1 kg (101 lb 9.6 oz) IBW/kg (Calculated) : 47.8 TPN AdjBW (KG): 51.6 Body mass index is 19.2 kg/m. Usual Weight: 49 kg  Assessment: 63 years of  age female with history of kidney and pancreas transplant 12/1997 followed at [facility] admitted for abdominal pain, nausea, and hypotension found to have internal hernia with small bowel volvulus and necrosis of terminal ileum and ascending colon requiring emergent laparotomy for resection of distal ileum and ascending colon, explant of transplanted pancreas, and ABThera VAC placement on 11/17 and returned for abdominal washout, ileocolic anastomosis, and abdominal closure 11/18. Patient was extubated 11/19. Patient is having bowel function but also nausea and vomiting with likely ileus. NGT placed to low-intermittent suction and patient made NPO. Pharmacy consulted for TPN.   Patient has a noted intolerance to soy as nausea - we will use SMOFlipids, which of our two lipid choices, has the least amount of soybean oil and monitor tolerance.   Glucose / Insulin : T1DM, CBGs 34-225. Utilized 5 units of SSI and 8 units semglee  in last 24 hours. TPN contains 3.5 units of regular insulin . pred5  Monitoring patient response closely in setting of removed transplanted pancreas. DM Coordinator added CGM monitor on 11/24.   Electrolytes: Na 136, K 4.2 (goal >/=4), Ch 106, Ca 8.5 [CoCa 10], Phos 3.9, Mg 2 (goal >/= 2) - MagOx added 11/27 + s/p 1g, previously refused IV Mg Renal: SCr  0.87, BUN 34 Hepatic: LFTs/Tbili/TG within normal limits. Albumin  2.1 Intake / Output; MIVF: 10 urine occurrences, LBM 11/29 x6 GI Imaging: 11/17 KUB - dilated small bowel loops consistent with obstruction 11/17 CT Abd - distal small bowel obstruction d/t mesenteric volvulus, no free gas, trace ascites 11/21 DG Abd decrease gaseous distention  of small bowel loops 11/26 CT Abd no bowel obstruction GI Surgeries / Procedures:  11/17 Exlap resection of distal ileum and ascending colon, explant of transplanted pancreas, and ABThera VAC placement  11/18 abdominal washout, ileocolic anastomosis, and abdominal closure  Central access: 01/26/24 TPN start date: 01/26/24  Nutritional Goals: Goal TPN rate is 65 mL/hr (provides 83 g of protein and 1515 kcals per day)  RD Assessment: Estimated Needs Total Energy Estimated Needs: 1500-1700 kcals Total Protein Estimated Needs: 75-90 g Total Fluid Estimated Needs: 1.5L  Current Nutrition:  Soft diet and TPN *Pancreatic enzymes started 11/28 11/28 day 1 calorie count: 44 g protein (59% needs), 586 kcal (39% needs); declining [name] 11/29 day 2 calorie count: pending  Plan:  Wean TPN today - discussed with RN 48h calorie count in process - consider Cortrak if not meeting nutritional needs  Thank you for involving pharmacy in this patient's care.  Delon Sax, PharmD, BCPS Clinical Pharmacist Clinical phone for 02/04/2024 is [phone] 02/04/2024 7:08 AM

## 2024-02-04 NOTE — Progress Notes (Signed)
 Patient ID: Sherri Todd, female   DOB: 05-Aug-1960, 63 y.o.   MRN: [id number] [facility] Progress Note   Assessment/ Plan:   1. Acute kidney Injury: Renal function improved/back to baseline and she remains nonoliguric and without any acute electrolyte abnormality.  She is back on her typical immunosuppressive therapy status post kidney transplant that she is tolerating without problems with increasing oral intake. 2.  History of simultaneous kidney/pancreas transplantation in 1999: Now back on oral tacrolimus , MMF and prednisone  (after previously holding MMF and conversion to SL tacrolimus  and Solu-Medrol ).  Repeat tacrolimus  level pending (please call nephrology if level <3). 3.  Ischemic bowel: Postoperative day #13 after exploratory laparotomy and resection of distal ileum/ascending colon and explant of transplanted pancreas for the management of internal hernia/small bowel volvulus with necrosis of terminal ileum and ascending colon.  Tolerating clear liquids and with plan to wean TPN based on calorie count.  No further diarrhea/abdominal pain. 4.  Septic shock: Appears to have improved/resolved status post management of ischemic bowel with surgical intervention.  Previously on Zosyn  that was discontinued after completion of 7 days of antibiotics. 5.  Encephalopathy: Resolved, suspected to be from hospital delirium.  CT scan of the abdomen/pelvis without any focal infection and urinalysis without distinct evidence of UTI (urine culture from 11/27 showed insignificant growth).  She is back on her maintenance immunosuppressive therapy and has stable allograft function.  Nephrology will sign off at this time and remain available for questions or concerns.  She follows up with [facility] nephrology/transplant service and can be seen there upon discharge from the hospital for routine follow-up.  Please call/curbside nephrology if repeat tacrolimus  level from yesterday remains  <3.0.  Subjective:   Had episode of hypoglycemia earlier today with reduction of TPN rate and lack of adequate carbohydrate intake yesterday p.m.  Objective:   BP 134/67 (BP Location: Left Arm)   Pulse 87   Temp 98.3 F (36.8 C) (Oral)   Resp 18   Ht 5' 1 (1.549 m)   Wt 46.1 kg   SpO2 99%   BMI 19.20 kg/m   Intake/Output Summary (Last 24 hours) at 02/04/2024 0845 Last data filed at 02/04/2024 0600 Gross per 24 hour  Intake 1000.33 ml  Output --  Net 1000.33 ml   Weight change: -0.414 kg  Physical Exam: Gen: Comfortable resting in bed CVS: Pulse regular rhythm, normal rate, S1 and S2 normal Resp: Diminished breath sounds over bases, poor inspiratory effort, no rales/rhonchi Abd: Intact abdominal midline dressing, bowel sounds scant Ext: No lower extremity edema  Imaging: No results found.   Labs: BMET Recent Labs  Lab 01/28/24 1755 01/29/24 0510 01/30/24 0330 01/31/24 0442 02/01/24 0624 02/03/24 0500  NA 139 136 134* 133* 135 136  K 5.4* 5.0 5.1 4.2 4.4 4.2  CL 105 102 103 104 106 106  CO2 21* 24 21* 21* 21* 22  GLUCOSE 261* 300* 341* 122* 168* 160*  BUN 26* 25* 28* 28* 25* 34*  CREATININE 0.97 0.90 0.91 0.84 0.75 0.87  CALCIUM  8.4* 8.3* 8.7* 8.3* 8.3* 8.5*  PHOS 3.9 3.6 3.8  --  3.9  --    CBC Recent Labs  Lab 02/01/24 0624 02/02/24 0441 02/03/24 0500 02/04/24 0505  WBC 18.0* 17.8* 14.0* 15.0*  NEUTROABS 15.0* 14.8* 11.0* 11.7*  HGB 8.0* 8.0* 7.7* 7.9*  HCT 24.9* 24.3* 24.2* 25.3*  MCV 106.0* 106.1* 106.1* 105.9*  PLT 247 260 308 348    Medications:  acetaminophen   650 mg Oral Q6H   Chlorhexidine  Gluconate Cloth  6 each Topical Daily   enoxaparin  (LOVENOX ) injection  30 mg Subcutaneous Q24H   feeding supplement (KATE FARMS STANDARD ENT 1.4)  325 mL Oral BID BM   FLUoxetine   20 mg Oral Daily   insulin  aspart  0-6 Units Subcutaneous Q4H   insulin  glargine-yfgn  8 Units Subcutaneous Daily   lipase/protease/amylase  12,000 Units Oral  TID WC   magnesium  oxide  400 mg Oral BID   mycophenolate   500 mg Oral BID   pantoprazole   40 mg Oral Daily   polycarbophil  625 mg Oral Daily   predniSONE   5 mg Oral Q breakfast   sodium chloride  flush  10-40 mL Intracatheter Q12H   tacrolimus   1 mg Oral BID    Gordy Blanch, MD 02/04/2024, 8:45 AM

## 2024-02-04 NOTE — Plan of Care (Signed)
   Problem: Education: Goal: Knowledge of General Education information will improve Description Including pain rating scale, medication(s)/side effects and non-pharmacologic comfort measures Outcome: Progressing   Problem: Health Behavior/Discharge Planning: Goal: Ability to manage health-related needs will improve Outcome: Progressing

## 2024-02-04 NOTE — Progress Notes (Addendum)
 Progress Note  12 Days Post-Op  Subjective: Feeling okay this morning.  Had a symptomatic hypoglycemic episode overnight  Objective: Vital signs in last 24 hours: Temp:  [98.1 F (36.7 C)-99 F (37.2 C)] 98.3 F (36.8 C) (11/30 0814) Pulse Rate:  [76-93] 87 (11/30 0814) Resp:  [16-28] 18 (11/30 0814) BP: (110-138)/(53-67) 134/67 (11/30 0814) SpO2:  [92 %-100 %] 99 % (11/30 0814) Weight:  [46.1 kg] 46.1 kg (11/30 0249) Last BM Date : 02/03/24  Intake/Output from previous day: 11/29 0701 - 11/30 0700 In: 1065.4 [P.O.:120; I.V.:945.4] Out: -  Intake/Output this shift: No intake/output data recorded.  PE: General: resting in bed, NAD Heart: RRR Lungs: nonlabored respirations Abd: soft, nontender, nondistended. Midline wound is open at the skin, wound bed is clean and dry with granulation tissue, fascia intact.  Surrounding skin maceration from damp dressings   Lab Results:  Recent Labs    02/03/24 0500 02/04/24 0505  WBC 14.0* 15.0*  HGB 7.7* 7.9*  HCT 24.2* 25.3*  PLT 308 348   BMET Recent Labs    02/03/24 0500  NA 136  K 4.2  CL 106  CO2 22  GLUCOSE 160*  BUN 34*  CREATININE 0.87  CALCIUM  8.5*   PT/INR No results for input(s): LABPROT, INR in the last 72 hours. CMP     Component Value Date/Time   NA 136 02/03/2024 0500   K 4.2 02/03/2024 0500   CL 106 02/03/2024 0500   CO2 22 02/03/2024 0500   GLUCOSE 160 (H) 02/03/2024 0500   BUN 34 (H) 02/03/2024 0500   CREATININE 0.87 02/03/2024 0500   CALCIUM  8.5 (L) 02/03/2024 0500   PROT 4.8 (L) 02/01/2024 0624   ALBUMIN  2.1 (L) 02/01/2024 0624   AST 19 02/01/2024 0624   ALT 25 02/01/2024 0624   ALKPHOS 55 02/01/2024 0624   BILITOT 0.3 02/01/2024 0624   GFRNONAA >60 02/03/2024 0500   Lipase  No results found for: LIPASE     Studies/Results: No results found.   Anti-infectives: Anti-infectives (From admission, onward)    Start     Dose/Rate Route Frequency Ordered Stop   01/24/24  1400  piperacillin -tazobactam (ZOSYN ) IVPB 3.375 g  Status:  Discontinued        3.375 g 12.5 mL/hr over 240 Minutes Intravenous Every 8 hours 01/24/24 0957 01/30/24 0740   01/22/24 1200  piperacillin -tazobactam (ZOSYN ) IVPB 2.25 g  Status:  Discontinued        2.25 g 100 mL/hr over 30 Minutes Intravenous Every 6 hours 01/22/24 9386 01/24/24 0957   01/21/24 2330  ceFEPIme  (MAXIPIME ) 2 g in sodium chloride  0.9 % 100 mL IVPB        2 g 200 mL/hr over 30 Minutes Intravenous  Once 01/21/24 2316 01/21/24 2356   01/21/24 2330  metroNIDAZOLE  (FLAGYL ) IVPB 500 mg        500 mg 100 mL/hr over 60 Minutes Intravenous  Once 01/21/24 2316 01/22/24 0109        Assessment/Plan  Hx of kidney-pancreas transplant 1999, followed at [facility]  Internal hernia with small bowel volvulus and necrosis of terminal ileum and ascending colon POD13 ex-lap, resection of distal ileum and ascending colon, explant of transplanted pancreas, ABThera VAC placement Dr. Dasie  POD12 ex-lap, abdominal washout, ileocolic anastomosis, abdominal closure Dr. Aron - Continue soft diet, discontinue TPN - Has completed 7 days abx postop. WBC remains elevated at 15 but decreasing, no other signs of infection, may be related to steroids.  -  CT 11/26 negative for intra-abdominal complication, UA negative - BID WTD to midline wound - T1DM s/p explant of transplanted pancreas: blood sugars have been labile, on low dose Semglee  and sliding scale. CGM placed. - fiber supplement for diarrhea, C. difficile pending but diarrhea is actually better with Creon addition -Needs to mobilize   FEN: soft, TPN (stop today), fiber supplement; insulin  needs/timing may need to be adjusted VTE: SQH ID: Zosyn  11/17>11/25   - per TRH -  HTN HLD Asthma Hx of CVA    LOS: 13 days    Sherri DELENA Freund, MD  [facility] 02/04/2024, 9:51 AM Please see Amion for pager number during day hours 7:00am-4:30pm

## 2024-02-04 NOTE — Progress Notes (Signed)
 Mobility Specialist Progress Note:    02/04/24 1343  Mobility  Activity Ambulated with assistance (To restroom)  Level of Assistance Standby assist, set-up cues, supervision of patient - no hands on  Assistive Device Other (Comment) (IV Pole)  Distance Ambulated (ft) 15 ft  Range of Motion/Exercises Active  Activity Response Tolerated well  Mobility Referral Yes  Mobility visit 1 Mobility  Mobility Specialist Start Time (ACUTE ONLY) 1343  Mobility Specialist Stop Time (ACUTE ONLY) 1351  Mobility Specialist Time Calculation (min) (ACUTE ONLY) 8 min   Received pt yelling for assistance out into the hallway to be taken to the bathroom. No c/o any symptoms. Pt able to move and ambulate well with SBA. Took pt to bathroom and returned to bed w/ all needs met.   Venetia Keel Mobility Specialist Please Neurosurgeon or Rehab Office at [phone]

## 2024-02-04 NOTE — Progress Notes (Signed)
 Mobility Specialist Progress Note:    02/04/24 1247  Mobility  Activity Ambulated with assistance  Level of Assistance Standby assist, set-up cues, supervision of patient - no hands on  Assistive Device Other (Comment) (IV Pole)  Distance Ambulated (ft) 400 ft  Range of Motion/Exercises Active  Activity Response Tolerated well  Mobility Referral Yes  Mobility visit 1 Mobility  Mobility Specialist Start Time (ACUTE ONLY) 1247  Mobility Specialist Stop Time (ACUTE ONLY) 1305  Mobility Specialist Time Calculation (min) (ACUTE ONLY) 18 min   Received pt laying in bed eager and agreeable to session. No c/o any symptoms. Pt stating she is feeling better and moving better. Needed to take 1 seated break before making return trip to room. Returned pt to bed w/ all needs met.   Venetia Keel Mobility Specialist Please Neurosurgeon or Rehab Office at [phone]

## 2024-02-05 LAB — BASIC METABOLIC PANEL WITH GFR
Anion gap: 6 (ref 5–15)
BUN: 25 mg/dL — ABNORMAL HIGH (ref 8–23)
CO2: 23 mmol/L (ref 22–32)
Calcium: 8.4 mg/dL — ABNORMAL LOW (ref 8.9–10.3)
Chloride: 100 mmol/L (ref 98–111)
Creatinine, Ser: 0.84 mg/dL (ref 0.44–1.00)
GFR, Estimated: >60 mL/min (ref >60)
Glucose, Bld: 353 mg/dL — ABNORMAL HIGH (ref 70–99)
Potassium: 4.3 mmol/L (ref 3.5–5.1)
Sodium: 129 mmol/L — ABNORMAL LOW (ref 135–145)

## 2024-02-05 LAB — GLUCOSE, CAPILLARY
Glucose-Capillary: 109 mg/dL — ABNORMAL HIGH (ref 70–99)
Glucose-Capillary: 167 mg/dL — ABNORMAL HIGH (ref 70–99)
Glucose-Capillary: 230 mg/dL — ABNORMAL HIGH (ref 70–99)
Glucose-Capillary: 333 mg/dL — ABNORMAL HIGH (ref 70–99)
Glucose-Capillary: 349 mg/dL — ABNORMAL HIGH (ref 70–99)
Glucose-Capillary: 80 mg/dL (ref 70–99)

## 2024-02-05 LAB — TACROLIMUS LEVEL: Tacrolimus (FK506) - LabCorp: 2.8 ng/mL — ABNORMAL LOW (ref 5.0–20.0)

## 2024-02-05 LAB — MAGNESIUM: Magnesium: 1.5 mg/dL — ABNORMAL LOW (ref 1.7–2.4)

## 2024-02-05 MED ORDER — INSULIN GLARGINE-YFGN 100 UNIT/ML ~~LOC~~ SOLN
6.0000 [IU] | Freq: Every day | SUBCUTANEOUS | Status: DC
  Administered 2024-02-05 – 2024-02-06 (×2): 6 [IU] via SUBCUTANEOUS
  Filled 2024-02-05 (×2): qty 0.06

## 2024-02-05 MED ORDER — INSULIN ASPART 100 UNIT/ML IJ SOLN
2.0000 [IU] | Freq: Three times a day (TID) | INTRAMUSCULAR | Status: DC
  Administered 2024-02-05 – 2024-02-06 (×2): 2 [IU] via SUBCUTANEOUS
  Filled 2024-02-05 (×3): qty 2

## 2024-02-05 MED ORDER — TRAMADOL HCL 50 MG PO TABS: 50.0000 mg | ORAL_TABLET | Freq: Four times a day (QID) | ORAL | 0 refills | Status: DC | PRN

## 2024-02-05 MED ORDER — ACETAMINOPHEN 325 MG PO TABS: 650.0000 mg | ORAL_TABLET | Freq: Four times a day (QID) | ORAL | Status: AC | PRN

## 2024-02-05 MED ORDER — MAGNESIUM SULFATE 4 GM/100ML IV SOLN
4.0000 g | Freq: Once | INTRAVENOUS | Status: AC
  Administered 2024-02-05: 4 g via INTRAVENOUS
  Filled 2024-02-05: qty 100

## 2024-02-05 NOTE — Progress Notes (Signed)
 Progress Note   Patient: Sherri Todd FMW:[id number] DOB: [birth year] DOA: 01/21/2024     14 DOS: the patient was seen and examined on 02/05/2024   Brief hospital course: Sherri Todd was admitted to the hospital with the working diagnosis of peritonitis and mesenteric volvulus.   63 yo female with the past medical history of kidney and pancreas transplantation in 1999, T1DM, hypertension and dyslipidemia who presented with abdominal pain, nausea and vomiting. She had worsening symptoms for the last 24 hrs prior to admission, because of severe pain she called EMS. She was found hypotensive, with systolic blood pressure in the 70's, she had 250 ml LR IV bolus and was transported to the ED. On the initial physical examination she continue to be hypotensive despite IV fluids, and was placed on vasopressors.  On admission blood pressure 102/53, HR 117, RR 26 and 02 saturation 97%  Lungs with no wheezing or rhonchi, heart with S1 and S2 present and tachycardic, abdomen with tenderness to palpation, distended but compressible, well healed lower surgical scar.   Na 135, K 3,8 Cl 100 bicarbonate 20, glucose 122, bun 31 cr 2,2 AST 36 ALT 15  Lactic acid 4.1 and 4.6  Wbc 16,3 hgb 13,8 plt 214  Urine analysis SG >1.030, protein > 300, small leukocytes, negative hgb. 21-50 rbc, 21-50 wbc   Chest radiograph with hypoinflation with no effusions or infiltrates, no effusions   EKG 106 bpm, normal axis, normal intervals qtc 476, sinus rhythm with no significant ST segment or T wave changes.   CT abdomen and pelvis with distal small bowel obstruction due to mesenteric volvulus. No free intraperitoneal gas. Trace ascites.  Transplant kidney in the left iliac fossa with small perinephric fluid collection, no significant mass effect of the transplanted kidney.   Patient underwent emergent laparotomy for small bowel obstruction secondary to mesenteric volvulus 1118 return 2 OR anastomosis closure 11/19  extubated 11/21 PICC line placed TPN started, transferred to [facility].  11/26 repeat CT abdomen and pelvis with no intra abdominal abscess. Positive small fluid around the transplanted kidney, mild hydronephrosis.  11/29 tolerating po well, starting to wean off TPN, reduced by 50% today.  11/30 stop TPN.  12/01 patient tolerating po well, will plan to monitor glucose today on insulin  regimen, if stable will plan for discharge home tomorrow. She is in risk of DKA and hypoglycemia.   Assessment and Plan: * Small bowel ischemia Peritonitis with necrosis and mesenteric volvulus.  Septic shock has resolved. (Present on admission) Initial surgery on 11.17 for internal hernia with small bowel volvulus and necrosis of the terminal ileum and ascending colon.  11/18 Reopening of prior laparotomy, ileocolonic anastamosis at proximal transverse colon, suture repair upon closure of umbilical hernia   11/26 CT abdomen and pelvis left lower quadrant renal transplant with mild hydronephrosis. Areas of parenchymal atrophy and cortical scarring predominantly involving the posterior  transplant kidney likely due to areas of infarct.  Small peri transplant fluid collection relatively similar or slightly increased since prior imaging, without significant mass effect.  Postsurgical changes of the bowel with ileocolic anastomosis in the right upper abdomen. No bowel obstruction.   Cultures have been with no growth. Repeat urine analysis with SG 1,010 with negative leukocytes, wbc 6-10  Wbc continue to trend down, today is 15.0   Recovering from post operative ileus.  Tolerating po well, now off TPN.    Continue pain control with tramadol  as needed.  Continue with pantoprazole  to po.  Out  of bed and continue mobilization   Post operative anemia, with follow up hgb at 7.9  Will continue close monitoring cell count. Positive iron deficiency with serum iron at 16, with TIBC 252 and transferrin saturation at 6, with  ferritin 186.  Some component of anemia of chronic disease.   Patient having multiple iron allergies, tolerating well po ferrous sulfate.   AKI (acute kidney injury) Hyponatremia, hypo and hyperkalemia, hypomagnesemia   Renal function today with serum cr at 0,84 with K at 4,3 and serum bicarbonate at 23 Na 129 with glucose 353, (pseudo hyponatremia)  Add 4 g mg sulfate.   Follow up renal function and electrolytes in am.    Type 1 diabetes mellitus (HCC) Positive hypoglycemia, Fasting capillary glucose 353  this am.   Plan to increase basal insulin  to 6 units and add 2 units short acting insulin  with meals.   Sp simultaneous renal and pancreas transplant in 1999  Continue tacrolimus , mycophenolate  and prednisone   Doing well with pancreatic enzymes.   Will keep patient in the hospital one more day to monitor glucose on current insulin  regimen, patient is at high risk for DKA and hypoglycemia.   Essential hypertension Continue blood pressure monitoring  History of CVA (cerebrovascular accident) Continue blood pressure monitoring   Malnutrition of moderate degree Tolerating po well, stopping TPN today  Depression Positive anxiety  Plan to continue fluoxetine   No further confusion.  Continue supportive therapy         Subjective: Patient with no chest pain and no dyspnea, abdominal pain has improved, no nausea or vomiting, she has been out of bed.   Physical Exam: Vitals:   02/05/24 0440 02/05/24 0551 02/05/24 0832 02/05/24 1208  BP: (!) 133/58  (!) 136/59 122/62  Pulse: 97  91 87  Resp:   16 16  Temp: 98.4 F (36.9 C)  98.7 F (37.1 C) 99.5 F (37.5 C)  TempSrc: Oral  Oral Oral  SpO2: 100%  99% 99%  Weight:  45.8 kg    Height:       Neurology awake and alert ENT with mild pallor Cardiovascular with S1 and S2 present and regular with no gallops, rubs or murmurs Respiratory with no rales or wheezing, no rhonchi  Abdomen with no distention, soft and non  tender to superficial palpation  No lower extremity edema   Data Reviewed:    Family Communication: no family at the bedside   Disposition: Status is: Inpatient Remains inpatient appropriate because: monitoring glucose   Planned Discharge Destination: Home     Author: Elidia Toribio Furnace, MD 02/05/2024 12:18 PM  For on call review [email or web address].

## 2024-02-05 NOTE — Progress Notes (Signed)
 Occupational Therapy Treatment Patient Details Name: Sherri Todd MRN: [id number] DOB: [birth year] Today's Date: 02/05/2024   History of present illness Pt is a 63 y.o female presented to ED 11/17 for R sided abdominal pain. CT showed small bowel obstruction. Intubated 11/17 for S/p ex lap, resection of distal ileum and ascending colon, explant of transplanted pancreas, temporary closure. 11/18 S/p reopening of prior laparotomy,  ileocolonic anastamosis at proximal transverse colon, suture repair upon closure of umbilical hernia. Extubated 11/19. PMH:  kidney-pancreas transplant 1999, HTN, CVA, DM   OT comments  Pt making good progress with functional goals. Pt in chair upon arrival and agreeable to work with OT  participating in functional mobility HHA to walk to bathroom for toilet transfers, toileting , shower transfers and standing at sink for grooming/hygiene; Sup for safety with these tasks. Pt seated in chair for UB/LB bathing and dressing set up-min A/ Pt with no c/o pain. Pt's d/c recommendations updated to [facility]. OT will continue to follow acutely to maximize level of function and safety      If plan is discharge home, recommend the following:  Assistance with cooking/housework;Assist for transportation;Help with stairs or ramp for entrance;A little help with walking and/or transfers;A little help with bathing/dressing/bathroom;Direct supervision/assist for medications management   Equipment Recommendations  BSC/3in1    Recommendations for Other Services      Precautions / Restrictions Precautions Precautions: Fall;Other (comment) Recall of Precautions/Restrictions: Intact Precaution/Restrictions Comments: Abdominal for comfort Restrictions Weight Bearing Restrictions Per Provider Order: No       Mobility Bed Mobility               General bed mobility comments: pt in chair upon arrival, just walked with MS    Transfers Overall transfer level: Needs  assistance Equipment used: Rolling walker (2 wheels) Transfers: Sit to/from Stand Sit to Stand: Supervision           General transfer comment: Sup for safety with slow, guarded pace of speed. Recently walked in hallway with MS     Balance Overall balance assessment: Needs assistance Sitting-balance support: Feet supported Sitting balance-Leahy Scale: Good     Standing balance support: No upper extremity supported, Bilateral upper extremity supported Standing balance-Leahy Scale: Fair                             ADL either performed or assessed with clinical judgement   ADL Overall ADL's : Needs assistance/impaired     Grooming: Oral care;Wash/dry face;Wash/dry hands;Supervision/safety;Standing   Upper Body Bathing: Set up;Sitting   Lower Body Bathing: Minimal assistance;Sitting/lateral leans Lower Body Bathing Details (indicate cue type and reason): pt reports that she has LH bath sponge at home Upper Body Dressing : Set up;Sitting   Lower Body Dressing: Minimal assistance;Sitting/lateral leans   Toilet Transfer: Supervision/safety;Ambulation;Regular Toilet   Toileting- Architect and Hygiene: Supervision/safety;Sit to/from stand   Tub/ Shower Transfer: Supervision/safety;Ambulation;Grab bars   Functional mobility during ADLs: Supervision/safety General ADL Comments: slow, guarded pace of speed, Sup for safety using grab bars for shower transfer    Extremity/Trunk Assessment Upper Extremity Assessment Upper Extremity Assessment: Generalized weakness   Lower Extremity Assessment Lower Extremity Assessment: Defer to PT evaluation        Vision Ability to See in Adequate Light: 0 Adequate Patient Visual Report: No change from baseline     Perception     Praxis     Communication Communication Communication: No apparent  difficulties   Cognition Arousal: Alert Behavior During Therapy: WFL for tasks assessed/performed                                  Following commands: Intact        Cueing   Cueing Techniques: Verbal cues  Exercises      Shoulder Instructions       General Comments      Pertinent Vitals/ Pain       Pain Assessment Pain Assessment: No/denies pain Pain Score: 0-No pain Pain Intervention(s): Monitored during session  Home Living                                          Prior Functioning/Environment              Frequency  Min 2X/week        Progress Toward Goals  OT Goals(current goals can now be found in the care plan section)  Progress towards OT goals: Progressing toward goals     Plan      Co-evaluation                 AM-PAC OT 6 Clicks Daily Activity     Outcome Measure   Help from another person eating meals?: None Help from another person taking care of personal grooming?: A Little Help from another person toileting, which includes using toliet, bedpan, or urinal?: A Little Help from another person bathing (including washing, rinsing, drying)?: A Little Help from another person to put on and taking off regular upper body clothing?: A Little Help from another person to put on and taking off regular lower body clothing?: A Little 6 Click Score: 19    End of Session Equipment Utilized During Treatment: Gait belt  OT Visit Diagnosis: Unsteadiness on feet (R26.81);Other abnormalities of gait and mobility (R26.89);Muscle weakness (generalized) (M62.81)   Activity Tolerance Patient tolerated treatment well   Patient Left in chair;with call bell/phone within reach;with chair alarm set   Nurse Communication Mobility status        Time: 8997-8970 OT Time Calculation (min): 27 min  Charges: OT Treatments $Self Care/Home Management : 8-22 mins $Therapeutic Activity: 8-22 mins    Jacques Karna Loose 02/05/2024, 11:19 AM

## 2024-02-05 NOTE — Progress Notes (Signed)
 Progress Note  13 Days Post-Op  Subjective: Feeling okay this morning.  Up in chair.  Tolerating her diet well.  Ate a whole omelet for breakfast along with a fruit cup.  Moving her bowels.  Diarrhea has slowed down.  No nausea  Objective: Vital signs in last 24 hours: Temp:  [98.3 F (36.8 C)-99 F (37.2 C)] 98.7 F (37.1 C) (12/01 0832) Pulse Rate:  [84-97] 91 (12/01 0832) Resp:  [16-18] 16 (12/01 0832) BP: (105-136)/(53-81) 136/59 (12/01 0832) SpO2:  [94 %-100 %] 99 % (12/01 0832) Weight:  [45.8 kg] 45.8 kg (12/01 0551) Last BM Date : 02/03/24  Intake/Output from previous day: 11/30 0701 - 12/01 0700 In: 240 [P.O.:240] Out: -  Intake/Output this shift: No intake/output data recorded.  PE: General: sitting up in her chair, NAD Lungs: nonlabored respirations Abd: soft, nontender, nondistended. Midline wound is open at the skin, wound bed is clean and dry with granulation tissue, fascia intact.    Lab Results:  Recent Labs    02/03/24 0500 02/04/24 0505  WBC 14.0* 15.0*  HGB 7.7* 7.9*  HCT 24.2* 25.3*  PLT 308 348   BMET Recent Labs    02/03/24 0500 02/05/24 0459  NA 136 129*  K 4.2 4.3  CL 106 100  CO2 22 23  GLUCOSE 160* 353*  BUN 34* 25*  CREATININE 0.87 0.84  CALCIUM  8.5* 8.4*   PT/INR No results for input(s): LABPROT, INR in the last 72 hours. CMP     Component Value Date/Time   NA 129 (L) 02/05/2024 0459   K 4.3 02/05/2024 0459   CL 100 02/05/2024 0459   CO2 23 02/05/2024 0459   GLUCOSE 353 (H) 02/05/2024 0459   BUN 25 (H) 02/05/2024 0459   CREATININE 0.84 02/05/2024 0459   CALCIUM  8.4 (L) 02/05/2024 0459   PROT 4.8 (L) 02/01/2024 0624   ALBUMIN  2.1 (L) 02/01/2024 0624   AST 19 02/01/2024 0624   ALT 25 02/01/2024 0624   ALKPHOS 55 02/01/2024 0624   BILITOT 0.3 02/01/2024 0624   GFRNONAA >60 02/05/2024 0459   Lipase  No results found for: LIPASE     Studies/Results: No results  found.   Anti-infectives: Anti-infectives (From admission, onward)    Start     Dose/Rate Route Frequency Ordered Stop   01/24/24 1400  piperacillin -tazobactam (ZOSYN ) IVPB 3.375 g  Status:  Discontinued        3.375 g 12.5 mL/hr over 240 Minutes Intravenous Every 8 hours 01/24/24 0957 01/30/24 0740   01/22/24 1200  piperacillin -tazobactam (ZOSYN ) IVPB 2.25 g  Status:  Discontinued        2.25 g 100 mL/hr over 30 Minutes Intravenous Every 6 hours 01/22/24 9386 01/24/24 0957   01/21/24 2330  ceFEPIme  (MAXIPIME ) 2 g in sodium chloride  0.9 % 100 mL IVPB        2 g 200 mL/hr over 30 Minutes Intravenous  Once 01/21/24 2316 01/21/24 2356   01/21/24 2330  metroNIDAZOLE  (FLAGYL ) IVPB 500 mg        500 mg 100 mL/hr over 60 Minutes Intravenous  Once 01/21/24 2316 01/22/24 0109        Assessment/Plan  Hx of kidney-pancreas transplant 1999, followed at [facility]  Internal hernia with small bowel volvulus and necrosis of terminal ileum and ascending colon POD14, ex-lap, resection of distal ileum and ascending colon, explant of transplanted pancreas, ABThera VAC placement Dr. Dasie  POD13, ex-lap, abdominal washout, ileocolic anastomosis, abdominal closure Dr. Aron -  Continue soft diet, TNA stopped yesterday.  Eating well. - Has completed 7 days abx postop. WBC remains elevated at 15 yesterday but decreasing, no other signs of infection, may be related to steroids.  -CT 11/26 negative for intra-abdominal complication, UA negative - BID WTD to midline wound - T1DM s/p explant of transplanted pancreas: blood sugars have been labile, on low dose Semglee  and sliding scale. CGM placed. - fiber supplement for diarrhea, C. difficile pending but diarrhea is actually better with Creon addition and so not resulted -surgically patient is stable for DC once medically stable.  D/w primary service -follow up and DC instructions provided   FEN: soft, TPN stopped, fiber supplement; insulin  needs/timing per  medicine VTE: SQH ID: [email or web address]  11/17>11/25   - per TRH -  HTN HLD Asthma Hx of CVA    LOS: 14 days    Burnard FORBES Banter, [facility] 02/05/2024, 11:12 AM Please see Amion for pager number during day hours 7:00am-4:30pm

## 2024-02-05 NOTE — Discharge Instructions (Signed)
WOUND CARE: - midline dressing to be changed daily - supplies: sterile saline, gauze, scissors, tape  - remove dressing and all packing carefully, moistening with sterile saline as needed to avoid packing/internal dressing sticking to the wound. - clean edges of skin around the wound with water/gauze, making sure there is no tape debris or leakage left on skin that could cause skin irritation or breakdown. - dampen and clean gauze with sterile saline and pack wound from wound base to skin level, making sure to take note of any possible areas of wound tracking, tunneling and packing appropriately. Wound can be packed loosely. Trim gauze to size if a whole gauze is not required. - cover wound with a dry gauze and secure with tape.  - write the date/time on the dry dressing/tape to better track when the last dressing change occurred. - change dressing as needed if leakage occurs, wound gets contaminated, or patient requests to shower. - patient may shower daily with wound open (i.e. remove all packing) and following the shower the wound should be dried and a clean dressing placed.    [facility]      [facility] [phone]  OPEN ABDOMINAL SURGERY: POST OP INSTRUCTIONS  Always review your discharge instruction sheet given to you by the facility where your surgery was performed.  IF YOU HAVE DISABILITY OR FAMILY LEAVE FORMS, YOU MUST BRING THEM TO THE OFFICE FOR PROCESSING.  PLEASE DO NOT GIVE THEM TO YOUR DOCTOR.  A prescription for pain medication may be given to you upon discharge.  Take your pain medication as prescribed, if needed.  If narcotic pain medicine is not needed, then you may take acetaminophen (Tylenol) or ibuprofen (Advil) as needed. Take your usually prescribed medications unless otherwise directed. If you need a refill on your pain medication, please contact your pharmacy. They will contact our office to request authorization.  Prescriptions will not be filled after  5pm or on week-ends. You should follow a light diet the first few days after arrival home, such as soup and crackers, pudding, etc.unless your doctor has advised otherwise. A high-fiber, low fat diet can be resumed as tolerated.   Be sure to include lots of fluids daily. Most patients will experience some swelling and bruising on the chest and neck area.  Ice packs will help.  Swelling and bruising can take several days to resolve Most patients will experience some swelling and bruising in the area of the incision. Ice pack will help. Swelling and bruising can take several days to resolve..  It is common to experience some constipation if taking pain medication after surgery.  Increasing fluid intake and taking a stool softener will usually help or prevent this problem from occurring.  A mild laxative (Milk of Magnesia or Miralax) should be taken according to package directions if there are no bowel movements after 48 hours.  You may have steri-strips (small skin tapes) in place directly over the incision.  These strips should be left on the skin for 7-10 days.  If your surgeon used skin glue on the incision, you may shower in 24 hours.  The glue will flake off over the next 2-3 weeks.  Any sutures or staples will be removed at the office during your follow-up visit. You may find that a light gauze bandage over your incision may keep your staples from being rubbed or pulled. You may shower and replace the bandage daily. ACTIVITIES:  You may resume regular (light) daily activities beginning the   next day--such as daily self-care, walking, climbing stairs--gradually increasing activities as tolerated.  You may have sexual intercourse when it is comfortable.  Refrain from any heavy lifting or straining until approved by your doctor. You may drive when you no longer are taking prescription pain medication, you can comfortably wear a seatbelt, and you can safely maneuver your car and apply brakes Return to Work:  ___________________________________ You should see your doctor in the office for a follow-up appointment approximately two weeks after your surgery.  Make sure that you call for this appointment within a day or two after you arrive home to insure a convenient appointment time. OTHER INSTRUCTIONS:  _____________________________________________________________ _____________________________________________________________  WHEN TO CALL YOUR DOCTOR: Fever over 101.0 Inability to urinate Nausea and/or vomiting Extreme swelling or bruising Continued bleeding from incision. Increased pain, redness, or drainage from the incision. Difficulty swallowing or breathing Muscle cramping or spasms. Numbness or tingling in hands or feet or around lips.  The clinic staff is available to answer your questions during regular business hours.  Please don't hesitate to call and ask to speak to one of the nurses if you have concerns.  For further questions, please visit [email or web address]  

## 2024-02-05 NOTE — Inpatient Diabetes Management (Signed)
 Inpatient Diabetes Program Recommendations  AACE/ADA: New Consensus Statement on Inpatient Glycemic Control (2015)  Target Ranges:  Prepandial:   less than 140 mg/dL      Peak postprandial:   less than 180 mg/dL (1-2 hours)      Critically ill patients:  140 - 180 mg/dL   Lab Results  Component Value Date   GLUCAP 230 (H) 02/05/2024   HGBA1C 5.0 01/22/2024    Review of Glycemic Control  Latest Reference Range & Units 02/04/24 09:03 02/04/24 11:40 02/04/24 16:17 02/04/24 20:25 02/05/24 00:06 02/05/24 04:25 02/05/24 09:37  Glucose-Capillary 70 - 99 mg/dL 676 (H) 767 (H) 761 (H) 214 (H) 80 333 (H) 230 (H)  (H): Data is abnormally high  Diabetes history: DM1(does not make insulin .  Needs correction, basal and meal coverage)  Outpatient Diabetes medications: None  Current orders for Inpatient glycemic control: Novolog  0-6 units Q4H, Semglee  6 units every day, Prednisone  5 mg QD  Inpatient Diabetes Program Recommendations:    Met with patient at bedside.  TPN stopped last evening.  Patient states she is eating well today.  Noted Semglee  incresed today from 4 units to 6 units.  Please consider:  Novolog  0-6 units TID Novolog  2 units TID with meals if she consumes at least 50%  Thank you, Wyvonna Pinal, MSN, CDCES Diabetes Coordinator Inpatient Diabetes Program [phone] (team pager from 8a-5p)

## 2024-02-05 NOTE — Progress Notes (Signed)
 Mobility Specialist Progress Note;   02/05/24 0950  Mobility  Activity Ambulated with assistance  Level of Assistance Standby assist, set-up cues, supervision of patient - no hands on  Assistive Device Front wheel walker  Distance Ambulated (ft) 470 ft  Activity Response Tolerated well  Mobility Referral Yes  Mobility visit 1 Mobility  Mobility Specialist Start Time (ACUTE ONLY) 0950  Mobility Specialist Stop Time (ACUTE ONLY) 1000  Mobility Specialist Time Calculation (min) (ACUTE ONLY) 10 min   Pt eager for mobility. Required SV during ambulation however some unsteadiness noted. VC required for RW proximity. Took 1x seated rest break d/t SOB. VSS throughout and no c/o when asked. Agreeable to sit up in chair at [facility]. Pt left in chair with all needs met, alarm on.   Lauraine Erm Mobility Specialist Please contact via SecureChat or [employer] [phone]

## 2024-02-05 NOTE — Progress Notes (Signed)
 Physical Therapy Treatment Patient Details Name: Sherri Todd MRN: [id number] DOB: 03-31-1960 Today's Date: 02/05/2024   History of Present Illness Pt is a 63 y.o female presented to ED 11/17 for R sided abdominal pain. CT showed small bowel obstruction. Intubated 11/17 for S/p ex lap, resection of distal ileum and ascending colon, explant of transplanted pancreas, temporary closure. 11/18 S/p reopening of prior laparotomy,  ileocolonic anastamosis at proximal transverse colon, suture repair upon closure of umbilical hernia. Extubated 11/19. PMH:  kidney-pancreas transplant 1999, HTN, CVA, DM    PT Comments  Pt received in supine agreeable to work with SPTA. Pt PICC line was underneath her on the opposite side of body, required CGA for bed mobility due to line mgmt. Pt able to stand with supervision using RW and was able to take a few steps with no AD but benefits from using RW due to instability. Pt performed 12 steps on 7in platform step in room with RW CGA. Pt ambulated in hall with RW with supervision at a functional but possibly unsafe speed, cues to slow down and focus on gait pattern. Pt slightly impulsive with standing and sitting along with decreased line awareness today. Pt would benefit from a focus safety education with transfers and ambulation during the next session.  Pt continues to benefit from PT services to progress toward functional mobility goals.    If plan is discharge home, recommend the following: A little help with walking and/or transfers;A little help with bathing/dressing/bathroom;Assistance with cooking/housework;Assist for transportation;Help with stairs or ramp for entrance   Can travel by private vehicle        Equipment Recommendations  BSC/3in1;Rolling walker (2 wheels)    Recommendations for Other Services Rehab consult     Precautions / Restrictions Precautions Precautions: Fall;Other (comment) Recall of Precautions/Restrictions:  Intact Precaution/Restrictions Comments: Abdominal for comfort Restrictions Weight Bearing Restrictions Per Provider Order: No     Mobility  Bed Mobility Overal bed mobility: Needs Assistance Bed Mobility: Supine to Sit, Sit to Supine     Supine to sit: Contact guard (due to line mgmt) Sit to supine: Supervision   General bed mobility comments: Pt able to get EOB but had decreased line awareness upon arrival PICC line was underneath her on the opposite side.    Transfers Overall transfer level: Needs assistance Equipment used: Rolling walker (2 wheels) Transfers: Sit to/from Stand Sit to Stand: Supervision           General transfer comment: Pt slightly impulsive and decreased line awareness    Ambulation/Gait Ambulation/Gait assistance: Supervision Gait Distance (Feet): 180 Feet (+160 after seated rest break) Assistive device: Rolling walker (2 wheels) Gait Pattern/deviations: Step-through pattern Gait velocity: functional but slightly unsafe speed given instability     General Gait Details: Ambulated longer distance today, took a seated rest break. Stated she wasnt fatigued just wanted to sit before walking back to room. Decreased line awareness today.   Stairs Stairs: Yes Stairs assistance: Contact guard assist Stair Management: Two rails, Step to pattern, Forwards, Backwards Number of Stairs: 12 General stair comments: single 7 platform step x12 reps able to alternate which foot she steps up/down with.   Wheelchair Mobility     Tilt Bed    Modified H[place]pas (Stroke Patients Only)       Balance Overall balance assessment: Needs assistance Sitting-balance support: Feet supported Sitting balance-Leahy Scale: Good Sitting balance - Comments: EOB   Standing balance support: No upper extremity supported, During functional activity Standing balance-Leahy  Scale: Fair Standing balance comment: Benefits from UE support but able to take a few steps without  AD                            Communication Communication Communication: No apparent difficulties  Cognition Arousal: Alert Behavior During Therapy: Impulsive   PT - Cognitive impairments: No apparent impairments                       PT - Cognition Comments: Cooperative, ambulates at fast pace and had decreased line awareness today in narrow spaces Following commands: Intact      Cueing Cueing Techniques: Verbal cues  Exercises      General Comments General comments (skin integrity, edema, etc.): Pt with good motivation to ambulate even after walking a few times today with OT and MS. Pt slightly impulsive to move and had decreased line awareness today.      Pertinent Vitals/Pain Pain Assessment Pain Assessment: No/denies pain Pain Intervention(s): Monitored during session    Home Living                          Prior Function            PT Goals (current goals can now be found in the care plan section) Acute Rehab PT Goals Patient Stated Goal: to improve mobility and decrease pain PT Goal Formulation: With patient Time For Goal Achievement: 02/08/24 Progress towards PT goals: Progressing toward goals    Frequency    Min 3X/week      PT Plan      Co-evaluation              AM-PAC PT 6 Clicks Mobility   Outcome Measure  Help needed turning from your back to your side while in a flat bed without using bedrails?: None Help needed moving from lying on your back to sitting on the side of a flat bed without using bedrails?: A Little Help needed moving to and from a bed to a chair (including a wheelchair)?: A Little Help needed standing up from a chair using your arms (e.g., wheelchair or bedside chair)?: A Little Help needed to walk in hospital room?: A Little Help needed climbing 3-5 steps with a railing? : A Little 6 Click Score: 19    End of Session Equipment Utilized During Treatment: Gait belt Activity Tolerance:  Patient tolerated treatment well Patient left: in bed;with call bell/phone within reach;with bed alarm set Nurse Communication: Mobility status PT Visit Diagnosis: Unsteadiness on feet (R26.81);Other abnormalities of gait and mobility (R26.89);Muscle weakness (generalized) (M62.81);Pain     Time: 1546-1600 PT Time Calculation (min) (ACUTE ONLY): 14 min  Charges:    $Gait Training: 8-22 mins PT General Charges $$ ACUTE PT VISIT: 1 Visit                     Sherri Todd 02/05/2024, 5:28 PM

## 2024-02-06 ENCOUNTER — Other Ambulatory Visit (HOSPITAL_COMMUNITY): Payer: Self-pay

## 2024-02-06 ENCOUNTER — Telehealth (HOSPITAL_COMMUNITY): Payer: Self-pay

## 2024-02-06 ENCOUNTER — Encounter (HOSPITAL_COMMUNITY): Payer: Self-pay

## 2024-02-06 ENCOUNTER — Other Ambulatory Visit (HOSPITAL_BASED_OUTPATIENT_CLINIC_OR_DEPARTMENT_OTHER): Payer: Self-pay

## 2024-02-06 LAB — BASIC METABOLIC PANEL WITH GFR
Anion gap: 7 (ref 5–15)
BUN: 20 mg/dL (ref 8–23)
CO2: 24 mmol/L (ref 22–32)
Calcium: 7.9 mg/dL — ABNORMAL LOW (ref 8.9–10.3)
Chloride: 105 mmol/L (ref 98–111)
Creatinine, Ser: 0.8 mg/dL (ref 0.44–1.00)
GFR, Estimated: >60 mL/min (ref >60)
Glucose, Bld: 210 mg/dL — ABNORMAL HIGH (ref 70–99)
Potassium: 3.5 mmol/L (ref 3.5–5.1)
Sodium: 136 mmol/L (ref 135–145)

## 2024-02-06 LAB — GLUCOSE, CAPILLARY
Glucose-Capillary: 104 mg/dL — ABNORMAL HIGH (ref 70–99)
Glucose-Capillary: 157 mg/dL — ABNORMAL HIGH (ref 70–99)
Glucose-Capillary: 160 mg/dL — ABNORMAL HIGH (ref 70–99)
Glucose-Capillary: 250 mg/dL — ABNORMAL HIGH (ref 70–99)
Glucose-Capillary: 67 mg/dL — ABNORMAL LOW (ref 70–99)

## 2024-02-06 LAB — MAGNESIUM: Magnesium: 2.2 mg/dL (ref 1.7–2.4)

## 2024-02-06 MED ORDER — POLYSACCHARIDE IRON COMPLEX 150 MG PO CAPS
150.0000 mg | ORAL_CAPSULE | Freq: Every day | ORAL | Status: DC
  Filled 2024-02-06: qty 1

## 2024-02-06 MED ORDER — MYCOPHENOLATE MOFETIL 250 MG PO CAPS: 500.0000 mg | ORAL_CAPSULE | Freq: Two times a day (BID) | ORAL | Status: AC

## 2024-02-06 MED ORDER — PANCRELIPASE (LIP-PROT-AMYL) 12000-38000 UNITS PO CPEP
12000.0000 [IU] | ORAL_CAPSULE | Freq: Three times a day (TID) | ORAL | 0 refills | Status: DC
  Filled 2024-02-06: qty 100, 30d supply, fill #0

## 2024-02-06 MED ORDER — INSULIN GLARGINE-YFGN 100 UNIT/ML ~~LOC~~ SOPN
6.0000 [IU] | PEN_INJECTOR | Freq: Every day | SUBCUTANEOUS | 0 refills | Status: DC
  Filled 2024-02-06: qty 3, 28d supply, fill #0
  Filled 2024-02-06: qty 3, 50d supply, fill #0

## 2024-02-06 MED ORDER — INSULIN ASPART 100 UNIT/ML FLEXPEN
2.0000 [IU] | PEN_INJECTOR | Freq: Three times a day (TID) | SUBCUTANEOUS | 0 refills | Status: AC
  Filled 2024-02-06: qty 3, 28d supply, fill #0

## 2024-02-06 MED ORDER — INSULIN GLARGINE-YFGN 100 UNIT/ML ~~LOC~~ SOPN: 6.0000 [IU] | PEN_INJECTOR | Freq: Every day | SUBCUTANEOUS | 0 refills | Status: AC

## 2024-02-06 MED ORDER — TRAMADOL HCL 50 MG PO TABS
50.0000 mg | ORAL_TABLET | Freq: Four times a day (QID) | ORAL | 0 refills | Status: AC | PRN
  Filled 2024-02-06: qty 10, 4d supply, fill #0

## 2024-02-06 MED ORDER — PANTOPRAZOLE SODIUM 40 MG PO TBEC
40.0000 mg | DELAYED_RELEASE_TABLET | Freq: Every day | ORAL | 0 refills | Status: AC
  Filled 2024-02-06: qty 30, 30d supply, fill #0

## 2024-02-06 MED ORDER — POLYSACCHARIDE IRON COMPLEX 150 MG PO CAPS
150.0000 mg | ORAL_CAPSULE | Freq: Every day | ORAL | 0 refills | Status: AC
  Filled 2024-02-06: qty 30, 30d supply, fill #0

## 2024-02-06 MED ORDER — INSULIN PEN NEEDLE 32G X 4 MM MISC
1.0000 | Freq: Three times a day (TID) | 0 refills | Status: AC
  Filled 2024-02-06 (×2): qty 100, 30d supply, fill #0

## 2024-02-06 NOTE — Progress Notes (Signed)
 VAST consult PICC line removal. Pt in the bed. HOB less than 45*. Pt held breath upon line removal. Instructed pt to remain in bed for 30 min. Pressure held , with no s/sx of bleeding. Pressure drsg applied. Instructed to keep drsg CDI for 24hrs. Notify RN of bleeding and hold pressure directly to site if noted bleeding. Pt VU. SO at bedside. Powell Abide, RN VAST

## 2024-02-06 NOTE — Progress Notes (Signed)
 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  PMH includes type 1 DM and ESRD s/p pancreas and kidney transplants in 1999, HTN, CVA. Pt is followed by [facility]  11/17 CT: SBO secondary to mesenteric volvulus; OR: Ex Lap, resection of distal ileum and ascending colon, explant of transplanted pancreas, open abdomen with wound VAC  11/18: OR- re-opening of prior laparotomy, ileocolonic anastomosis as proximal transverse colon, suture repair  upon closure of umbilical hernia 11/19 extubated; diet advanced to clear liquids 11/21 emesis; NGT back to suction; DG abdomen- decreased gaseous distension of small bowel loops; NPO; TPN initiation 11/24 NGT back to suction 11/25 - NGT removed 11/30 - TPN discontinued  Spoke with patient at bedside. She is in good spirits and feeling much better. She is looking forward to discharge.   She states that she is trying to eat as best as she can. She recalls  consuming some of each meal. Patient reports that she is able to count carbs. She did not like the [employer] protein supplements though did try them. RD discontinued this nutrition supplement.   Patient has not other nutrition related questions or concerns at this time. Continue to encourage adequate oral intake with emphasis on balanced nutrition and protein with meals/snacks.   Calorie Count results from 11/29: Breakfast: not recorded Lunch: 100% turkey burger with swiss cheese, 40% white rice, 100% sliced carrots Dinner: not recorded  Medications: SSI 0-6 units q4h, novolog  2 units TID, semglee  6 units daily, niferex, creon TID, MVI, fibercon, prednisone   Labs:  CBG's 67-349 x24 hours  Diet Order:   Diet Order             Diet - low sodium heart healthy           Diet regular Fluid consistency: Thin  Diet effective now                   EDUCATION NEEDS:   Not appropriate for education at this time  Skin:  Skin Assessment: Skin Integrity Issues: Skin Integrity Issues:: Wound VAC Wound Vac: abdomen removed  Last BM:  12/1 type 6 x2 (medium)  Height:   Ht Readings from Last 1 Encounters:  01/23/24 5' 1 (1.549 m)    Weight:   Wt Readings from Last 1 Encounters:  02/06/24 44.9 kg   BMI:  Body mass index is 18.7 kg/m.  Estimated Nutritional Needs:   Kcal:  1500-1700 kcals  Protein:  75-90 g  Fluid:  1.5L  Sherri Todd, RDN, LDN Clinical Nutrition See AMiON for contact information.

## 2024-02-06 NOTE — Telephone Encounter (Signed)
 Pharmacy Patient Advocate Encounter  Insurance verification completed.    The patient is insured through [employer]. Patient has [employer], may use a copay card, and/or apply for patient assistance if available.    Ran test claim for Basaglar  100unit Pen and the current 30 day co-pay is $35.  Ran test claim for Humalog 100unit Pen and the current 30 day co-pay is $30.  This test claim was processed through [facility]- copay amounts may vary at other pharmacies due to pharmacy/plan contracts, or as the patient moves through the different stages of their insurance plan.

## 2024-02-06 NOTE — Telephone Encounter (Signed)
 Pharmacy Patient Advocate Encounter  Received notification from [facility] that Prior Authorization for Creon [id number] dr capsules has been APPROVED from 02/06/24 to 02/05/25   PA #/Case ID/Reference #: [id number]

## 2024-02-06 NOTE — TOC Transition Note (Signed)
 Transition of Care (TOC) - Discharge Note Rayfield Gobble RN, BSN Inpatient Care Management Unit 4E- RN Case Manager See Treatment Team for direct phone #   Patient Details  Name: Sherri Todd MRN: [id number] Date of Birth: 10/14/1960  Transition of Care [facility]) CM/SW Contact:  Gobble Rayfield Hurst, RN Phone Number: 02/06/2024, 11:41 AM   Clinical Narrative:    Pt stable for transition home today, noted therapy recs updated to [facility]   CM spoke with pt at bedside, spouse also present. Discussed recommendations for [facility] and DME needs. Pt states she has DME at home - declines any DME needs. Pt also declines HH- voiced she does not need it.  Bedside RN to educate pt and spouse on wound care.  HH/DME declined by pt on discharge.   Spouse to transport home.   No further IP CM needs noted.    Final next level of care: Home/Self Care Barriers to Discharge: Barriers Resolved   Patient Goals and CMS Choice Patient states their goals for this hospitalization and ongoing recovery are:: return home   Choice offered to / list presented to : Patient      Discharge Placement               Home        Discharge Plan and Services Additional resources added to the After Visit Summary for     Discharge Planning Services: CM Consult Post Acute Care Choice: Home Health, Durable Medical Equipment          DME Arranged: N/A DME Agency: NA       HH Arranged: Patient Refused HH HH Agency: NA        Social Drivers of Health (SDOH) Interventions SDOH Screenings   Food Insecurity: Patient Unable To Answer (01/22/2024)  Housing: Patient Unable To Answer (01/22/2024)  Transportation Needs: Patient Unable To Answer (01/22/2024)  Utilities: Patient Unable To Answer (01/22/2024)  Tobacco Use: Low Risk  (01/22/2024)     Readmission Risk Interventions    02/06/2024   11:40 AM  Readmission Risk Prevention Plan  Transportation Screening Complete  HRI or Home Care Consult  Complete  Social Work Consult for Recovery Care Planning/Counseling Complete  Palliative Care Screening Not Applicable  Medication Review [employer]) Complete

## 2024-02-06 NOTE — Progress Notes (Signed)
 Reviewed AVS, patient expressed understanding of medications, MD follow up reviewed.   See LDA for information on wounds at discharge. Patient states all belongings brought to the hospital at time of admission are accounted for and packed to take home.  Picked up medications from [facility] pharmacy. Patient informed and expressed understanding where to pick up discharge medications.  [facility] contacted to transport patient to entrance A, where family member was waiting in vehicle to transport home.

## 2024-02-06 NOTE — Discharge Summary (Addendum)
 Physician Discharge Summary   Patient: Sherri Todd MRN: [id number] DOB: [birth year]  Admit date:     01/21/2024  Discharge date: 02/06/24  Discharge Physician: Elidia Sieving Neng Albee   PCP: Sun, Vyvyan, MD   Recommendations at discharge:    Patient will continue insulin  therapy with basal 6 units and pre mal 2 units of short acting. Continues glucose monitor at home and close follow up.  Pain control with tramadol  and acetaminophen .  Added pantoprazole , holding pancreatic enzymes due to high cost  Blood pressure has been well controlled, holding on diltiazem for now Follow up renal function and cell count as outpatient in 7 days Follow up with Dr Austin in 7 to 10 days Follow up with general surgery as scheduled.   Discharge Diagnoses: Principal Problem:   Small bowel ischemia Active Problems:   AKI (acute kidney injury)   Type 1 diabetes mellitus (HCC)   Essential hypertension   History of CVA (cerebrovascular accident)   Malnutrition of moderate degree   Depression  Resolved Problems:   * No resolved hospital problems. [facility] Course: Mrs. Brinegar was admitted to the hospital with the working diagnosis of peritonitis and mesenteric volvulus.   63 yo female with the past medical history of kidney and pancreas transplantation in 1999, T1DM, hypertension and dyslipidemia who presented with abdominal pain, nausea and vomiting. She had worsening symptoms for the last 24 hrs prior to admission, because of severe pain she called EMS. She was found hypotensive, with systolic blood pressure in the 70's, she had 250 ml LR IV bolus and was transported to the ED. On the initial physical examination she continue to be hypotensive despite IV fluids, and was placed on vasopressors.  On admission blood pressure 102/53, HR 117, RR 26 and 02 saturation 97%  Lungs with no wheezing or rhonchi, heart with S1 and S2 present and tachycardic, abdomen with tenderness to palpation, distended but  compressible, well healed lower surgical scar.   Na 135, K 3,8 Cl 100 bicarbonate 20, glucose 122, bun 31 cr 2,2 AST 36 ALT 15  Lactic acid 4.1 and 4.6  Wbc 16,3 hgb 13,8 plt 214  Urine analysis SG >1.030, protein > 300, small leukocytes, negative hgb. 21-50 rbc, 21-50 wbc   Chest radiograph with hypoinflation with no effusions or infiltrates, no effusions   EKG 106 bpm, normal axis, normal intervals qtc 476, sinus rhythm with no significant ST segment or T wave changes.   CT abdomen and pelvis with distal small bowel obstruction due to mesenteric volvulus. No free intraperitoneal gas. Trace ascites.  Transplant kidney in the left iliac fossa with small perinephric fluid collection, no significant mass effect of the transplanted kidney.   Patient underwent emergent laparotomy for small bowel obstruction secondary to mesenteric volvulus 1118 return 2 OR anastomosis closure 11/19 extubated 11/21 PICC line placed TPN started, transferred to [facility].  11/26 repeat CT abdomen and pelvis with no intra abdominal abscess. Positive small fluid around the transplanted kidney, mild hydronephrosis.  11/29 tolerating po well, starting to wean off TPN, reduced by 50% today.  11/30 stop TPN.  12/01 patient tolerating po well, will plan to monitor glucose today on insulin  regimen, if stable will plan for discharge home tomorrow. She is in risk of DKA and hypoglycemia.  12/02 glucose has been stable, plan to continue sq insulin  at home and have close follow up as outpatient.   Assessment and Plan: * Small bowel ischemia Peritonitis with necrosis and mesenteric volvulus.  Septic shock has resolved. (Present on admission) Initial surgery on 11.17 for internal hernia with small bowel volvulus and necrosis of the terminal ileum and ascending colon.  11/18 Reopening of prior laparotomy, ileocolonic anastamosis at proximal transverse colon, suture repair upon closure of umbilical hernia   11/26 CT abdomen and  pelvis left lower quadrant renal transplant with mild hydronephrosis. Areas of parenchymal atrophy and cortical scarring predominantly involving the posterior  transplant kidney likely due to areas of infarct.  Small peri transplant fluid collection relatively similar or slightly increased since prior imaging, without significant mass effect.  Postsurgical changes of the bowel with ileocolic anastomosis in the right upper abdomen. No bowel obstruction.   Cultures have been with no growth. Repeat urine analysis with SG 1,010 with negative leukocytes, wbc 6-10  Wbc continue to trend down, today is 15.0   Recovering from post operative ileus.  Patient required TPN for nutritional support. Currently she is tolerating po well,  Continue with pantoprazole  to po.  Pain control with acetaminophen  and tramadol .    Post operative anemia, with follow up hgb at 7.9  Positive iron deficiency anemia with serum iron at 16, with TIBC 252 and transferrin saturation at 6, with ferritin 186.  Some component of anemia of chronic disease.   Patient having multiple iron allergies, she received oral ferrous sulfate with good toleration, but she would like to continue on her usual over the counter iron supplements. For now will plan to follow up iron panel and hgb as outpatient   AKI (acute kidney injury) Hyponatremia, hypo and hyperkalemia, hypomagnesemia   At the time of discharge her renal function had a serum cr of 0,80 with K at 3,5 and serum bicarbonate at 24  Na 136 and Mg 2.2   Follow up renal function and electrolytes as outpatient.    Type 1 diabetes mellitus (HCC) Positive hypoglycemia, Fasting capillary glucose 210  this am.   Plan to continue with basal insulin  to 6 units and 2 units short acting insulin  with meals.  Continuous glucose monitor at home.   Sp simultaneous renal and pancreas transplant in 1999  Continue tacrolimus , mycophenolate  and prednisone   Unfortunately outpatient  pancreatic enzymes have high cost.   Dyslipidemia, continue with statin   Essential hypertension Continue blood pressure monitoring Continue to hold on diltiazem.   History of CVA (cerebrovascular accident) Continue blood pressure monitoring  Continue with aspirin and statin   Malnutrition of moderate degree Continue with nutritional supplements  Depression Plan to continue fluoxetine      Consultants: Surgery  Procedures performed: as above   Disposition: Home Diet recommendation:  Carb modified diet DISCHARGE MEDICATION: Allergies as of 02/06/2024       Reactions   Ferric Oxide Anaphylaxis   IV Ferric Oxide    Gluten Meal Swelling   Other reaction(s): Unknown   Iron Dextran Anaphylaxis   Iron Sucrose Anaphylaxis   Soy Allergy (obsolete) Nausea Only   Wheat Other (See Comments)   Celiac disease Celiac disease        Medication List     STOP taking these medications    diltiazem 240 MG 24 hr capsule Commonly known as: CARDIZEM CD       TAKE these medications    acetaminophen  325 MG tablet Commonly known as: TYLENOL  Take 2 tablets (650 mg total) by mouth every 6 (six) hours as needed (pain).   alendronate 70 MG tablet Commonly known as: FOSAMAX Take 70 mg by mouth once a week.  aspirin EC 81 MG tablet Take 81 mg by mouth daily.   FLUoxetine  20 MG capsule Commonly known as: PROZAC  Take 20 mg by mouth daily.   insulin  aspart 100 UNIT/ML FlexPen Commonly known as: NOVOLOG  Inject 2 Units into the skin 3 (three) times daily with meals.   insulin  glargine-yfgn 100 UNIT/ML Pen Commonly known as: SEMGLEE  Inject 6 Units into the skin daily.   Insulin  Pen Needle 32G X 4 MM Misc Use in the morning, at noon, and at bedtime.   iron polysaccharides 150 MG capsule Commonly known as: NIFEREX Take 1 capsule (150 mg total) by mouth daily.   multivitamin capsule Take 1 capsule by mouth daily.   mycophenolate  250 MG capsule Commonly known as:  CELLCEPT  Take 2 capsules (500 mg total) by mouth 2 (two) times daily.   Osteo Bi-Flex-Glucos/5-Loxin Tabs Take 1 Dose by mouth daily.   pantoprazole  40 MG tablet Commonly known as: PROTONIX  Take 1 tablet (40 mg total) by mouth daily. Start taking on: February 07, 2024   predniSONE  5 MG tablet Commonly known as: DELTASONE  Take 5 mg by mouth daily.   rosuvastatin 40 MG tablet Commonly known as: CRESTOR Take 40 mg by mouth daily.   tacrolimus  0.5 MG capsule Commonly known as: PROGRAF  Take 1 mg by mouth 2 (two) times daily.   traMADol  50 MG tablet Commonly known as: ULTRAM  Take 1 tablet (50 mg total) by mouth every 6 (six) hours as needed for severe pain (pain score 7-10) (pain).   valACYclovir 500 MG tablet Commonly known as: VALTREX Take 500 mg by mouth 2 (two) times daily as needed (Outbreak).   Vitamin D3 50 MCG (2000 UT) capsule Take 2,000 Units by mouth daily.        Follow-up Information     Dasie Leonor CROME, MD Follow up on 02/21/2024.   Specialty: General Surgery Why: 10:40am, Arrive 30 minutes prior to your appointment time, Please bring your insurance card and photo ID Contact information: [address] [place] [address] [phone]                Discharge Exam: Fredricka Weights   02/04/24 0249 02/05/24 0551 02/06/24 0500  Weight: 46.1 kg 45.8 kg 44.9 kg   BP 123/67 (BP Location: Left Arm)   Pulse 86   Temp 98.3 F (36.8 C) (Oral)   Resp 18   Ht 5' 1 (1.549 m)   Wt 44.9 kg   SpO2 100%   BMI 18.70 kg/m   Patient is feeling better, no chest pain and no dyspnea, no nausea or vomiting, tolerating po well  Neurology awake and alert ENT with mild pallor with no icterus Cardiovascular with S1 and S2 present and regular with no gallops, rubs or murmurs Respiratory with no rales or wheezing, no rhonchi  Abdomen is soft and non tender, not distended No lower extremity edema.   Condition at discharge: stable  The results of  significant diagnostics from this hospitalization (including imaging, microbiology, ancillary and laboratory) are listed below for reference.   Imaging Studies:   Microbiology: Results for orders placed or performed during the hospital encounter of 01/21/24  Culture, blood (Routine x 2)     Status: None   Collection Time: 01/21/24 10:48 PM   Specimen: BLOOD  Result Value Ref Range Status   Specimen Description BLOOD RIGHT ANTECUBITAL  Final   Special Requests   Final    BOTTLES DRAWN AEROBIC AND ANAEROBIC Blood Culture results may  not be optimal due to an inadequate volume of blood received in culture bottles   Culture   Final    NO GROWTH 5 DAYS Performed at [facility], [address], [address] [place], [place] [address]    Report Status 01/26/2024 FINAL  Final  Culture, blood (Routine x 2)     Status: None   Collection Time: 01/21/24 11:18 PM   Specimen: BLOOD  Result Value Ref Range Status   Specimen Description BLOOD BLOOD LEFT ARM  Final   Special Requests   Final    BOTTLES DRAWN AEROBIC AND ANAEROBIC Blood Culture results may not be optimal due to an inadequate volume of blood received in culture bottles   Culture   Final    NO GROWTH 5 DAYS Performed at [facility], [address], [place], [place] [address]    Report Status 01/26/2024 FINAL  Final  MRSA Next Gen by PCR, Nasal     Status: None   Collection Time: 01/22/24  6:03 AM   Specimen: Nasal Mucosa; Nasal Swab  Result Value Ref Range Status   MRSA by PCR Next Gen NOT DETECTED NOT DETECTED Final    Comment: (NOTE) The GeneXpert MRSA Assay (FDA approved for NASAL specimens only), is one component of a comprehensive MRSA colonization surveillance program. It is not intended to diagnose MRSA infection nor to guide or monitor treatment for MRSA infections. Test performance is not FDA approved in patients less than 21 years old. Performed at [facility], [address], [place], [place] [address]    Surgical PCR screen     Status: None   Collection Time: 01/22/24  9:08 AM   Specimen: Nasal Mucosa; Nasal Swab  Result Value Ref Range Status   MRSA, PCR NEGATIVE NEGATIVE Final   Staphylococcus aureus NEGATIVE NEGATIVE Final    Comment: (NOTE) The Xpert SA Assay (FDA approved for NASAL specimens in patients 28 years of age and older), is one component of a comprehensive surveillance program. It is not intended to diagnose infection nor to guide or monitor treatment. Performed at [facility], [address], [place], [place] [address]   Urine Culture (for pregnant, neutropenic or urologic patients or patients with an indwelling urinary catheter)     Status: None   Collection Time: 01/26/24  8:24 PM   Specimen: Urine, Clean Catch  Result Value Ref Range Status   Specimen Description URINE, CLEAN CATCH  Final   Special Requests Normal  Final   Culture   Final    NO GROWTH Performed at [facility], [address], [place], [place] [address]    Report Status 01/27/2024 FINAL  Final  Urine Culture     Status: Abnormal   Collection Time: 02/01/24  8:42 AM   Specimen: Urine, Clean Catch  Result Value Ref Range Status   Specimen Description URINE, CLEAN CATCH  Final   Special Requests Immunocompromised  Final   Culture (A)  Final    <10,000 COLONIES/mL INSIGNIFICANT GROWTH Performed at [facility], [address], [place], [place] [address]    Report Status 02/03/2024 FINAL  Final    Labs: CBC: Recent Labs  Lab 01/31/24 0442 02/01/24 0624 02/02/24 0441 02/03/24 0500 02/04/24 0505  WBC 21.0* 18.0* 17.8* 14.0* 15.0*  NEUTROABS 17.4* 15.0* 14.8* 11.0* 11.7*  HGB 8.1* 8.0* 8.0* 7.7* 7.9*  HCT 24.7* 24.9* 24.3* 24.2* 25.3*  MCV 104.7* 106.0* 106.1* 106.1* 105.9*  PLT 220 247 260 308 348   Basic Metabolic Panel: Recent Labs  Lab 01/31/24 0442 02/01/24 0624 02/02/24 0441 02/03/24 0500 02/05/24 0459 02/06/24 0500  NA 133* 135  --  136 129* 136  K  4.2 4.4  --  4.2 4.3 3.5  CL 104 106  --  106 100 105  CO2 21* 21*  --  22 23 24   GLUCOSE 122* 168*  --  160* 353* 210*  BUN 28* 25*  --  34* 25* 20  CREATININE 0.84 0.75  --  0.87 0.84 0.80  CALCIUM  8.3* 8.3*  --  8.5* 8.4* 7.9*  MG 1.8 1.8 1.9 2.0 1.5* 2.2  PHOS  --  3.9  --   --   --   --    Liver Function Tests: Recent Labs  Lab 02/01/24 0624  AST 19  ALT 25  ALKPHOS 55  BILITOT 0.3  PROT 4.8*  ALBUMIN  2.1*   CBG: Recent Labs  Lab 02/05/24 1645 02/05/24 2035 02/06/24 0025 02/06/24 0332 02/06/24 0739  GLUCAP 349* 109* 104* 157* 250*    Discharge time spent: greater than 30 minutes.  Signed: Elidia Toribio Furnace, MD [facility] 02/06/2024

## 2024-02-06 NOTE — Progress Notes (Signed)
 Abdominal dressing changed per MD order without difficulty.  Patient was educated on how to do this at home and expressed understanding.

## 2024-02-07 LAB — GLUCOSE, CAPILLARY: Glucose-Capillary: 77 mg/dL (ref 70–99)

## 2024-04-02 NOTE — Telephone Encounter (Signed)
 ERROR

## 2024-06-26 ENCOUNTER — Ambulatory Visit: Admitting: Nurse Practitioner
